# Patient Record
Sex: Female | Born: 1955 | Race: White | Hispanic: No | Smoking: Never smoker
Health system: Southern US, Community
[De-identification: ages and names within clinical notes are randomized; demographics above are authoritative.]

## PROBLEM LIST (undated history)

## (undated) DIAGNOSIS — K219 Gastro-esophageal reflux disease without esophagitis: Secondary | ICD-10-CM

## (undated) DIAGNOSIS — K8050 Calculus of bile duct without cholangitis or cholecystitis without obstruction: Secondary | ICD-10-CM

## (undated) DIAGNOSIS — Z8709 Personal history of other diseases of the respiratory system: Secondary | ICD-10-CM

## (undated) DIAGNOSIS — M549 Dorsalgia, unspecified: Secondary | ICD-10-CM

## (undated) DIAGNOSIS — R1032 Left lower quadrant pain: Secondary | ICD-10-CM

## (undated) DIAGNOSIS — K589 Irritable bowel syndrome without diarrhea: Secondary | ICD-10-CM

## (undated) DIAGNOSIS — K8020 Calculus of gallbladder without cholecystitis without obstruction: Secondary | ICD-10-CM

## (undated) DIAGNOSIS — R0602 Shortness of breath: Secondary | ICD-10-CM

## (undated) DIAGNOSIS — K5900 Constipation, unspecified: Secondary | ICD-10-CM

## (undated) DIAGNOSIS — M2550 Pain in unspecified joint: Secondary | ICD-10-CM

## (undated) DIAGNOSIS — M722 Plantar fascial fibromatosis: Secondary | ICD-10-CM

## (undated) DIAGNOSIS — I10 Essential (primary) hypertension: Secondary | ICD-10-CM

## (undated) DIAGNOSIS — F419 Anxiety disorder, unspecified: Secondary | ICD-10-CM

## (undated) DIAGNOSIS — E781 Pure hyperglyceridemia: Secondary | ICD-10-CM

## (undated) DIAGNOSIS — E7800 Pure hypercholesterolemia, unspecified: Secondary | ICD-10-CM

## (undated) HISTORY — DX: Shortness of breath: R06.02

## (undated) HISTORY — DX: Constipation, unspecified: K59.00

## (undated) HISTORY — DX: Personal history of other diseases of the respiratory system: Z87.09

## (undated) HISTORY — DX: Plantar fascial fibromatosis: M72.2

## (undated) HISTORY — DX: Gastro-esophageal reflux disease without esophagitis: K21.9

## (undated) HISTORY — DX: Pure hypercholesterolemia, unspecified: E78.00

## (undated) HISTORY — DX: Dorsalgia, unspecified: M54.9

## (undated) HISTORY — DX: Calculus of bile duct without cholangitis or cholecystitis without obstruction: K80.50

## (undated) HISTORY — DX: Pure hyperglyceridemia: E78.1

## (undated) HISTORY — DX: Calculus of gallbladder without cholecystitis without obstruction: K80.20

## (undated) HISTORY — PX: FLEXIBLE SIGMOIDOSCOPY: SHX1649

## (undated) HISTORY — DX: Irritable bowel syndrome, unspecified: K58.9

## (undated) HISTORY — DX: Pain in unspecified joint: M25.50

## (undated) HISTORY — DX: Left lower quadrant pain: R10.32

## (undated) HISTORY — DX: Anxiety disorder, unspecified: F41.9

---

## 1999-11-29 ENCOUNTER — Other Ambulatory Visit: Admission: RE | Admit: 1999-11-29 | Discharge: 1999-11-29 | Payer: Self-pay | Admitting: Obstetrics and Gynecology

## 1999-11-30 ENCOUNTER — Encounter: Payer: Self-pay | Admitting: Obstetrics and Gynecology

## 1999-11-30 ENCOUNTER — Encounter: Admission: RE | Admit: 1999-11-30 | Discharge: 1999-11-30 | Payer: Self-pay | Admitting: Obstetrics and Gynecology

## 2000-02-27 ENCOUNTER — Other Ambulatory Visit: Admission: RE | Admit: 2000-02-27 | Discharge: 2000-02-27 | Payer: Self-pay | Admitting: Gastroenterology

## 2004-06-08 ENCOUNTER — Ambulatory Visit (HOSPITAL_COMMUNITY): Admission: RE | Admit: 2004-06-08 | Discharge: 2004-06-08 | Payer: Self-pay | Admitting: Internal Medicine

## 2004-08-16 ENCOUNTER — Other Ambulatory Visit: Admission: RE | Admit: 2004-08-16 | Discharge: 2004-08-16 | Payer: Self-pay | Admitting: Obstetrics and Gynecology

## 2005-10-08 ENCOUNTER — Emergency Department (HOSPITAL_COMMUNITY): Admission: EM | Admit: 2005-10-08 | Discharge: 2005-10-08 | Payer: Self-pay | Admitting: Emergency Medicine

## 2006-04-03 ENCOUNTER — Other Ambulatory Visit: Admission: RE | Admit: 2006-04-03 | Discharge: 2006-04-03 | Payer: Self-pay | Admitting: Obstetrics and Gynecology

## 2007-02-02 ENCOUNTER — Emergency Department (HOSPITAL_COMMUNITY): Admission: EM | Admit: 2007-02-02 | Discharge: 2007-02-02 | Payer: Self-pay | Admitting: Emergency Medicine

## 2007-02-06 ENCOUNTER — Emergency Department (HOSPITAL_COMMUNITY): Admission: EM | Admit: 2007-02-06 | Discharge: 2007-02-06 | Payer: Self-pay | Admitting: Emergency Medicine

## 2007-02-18 ENCOUNTER — Ambulatory Visit: Payer: Self-pay | Admitting: Orthopedic Surgery

## 2007-03-05 DIAGNOSIS — Z8679 Personal history of other diseases of the circulatory system: Secondary | ICD-10-CM | POA: Insufficient documentation

## 2007-03-11 ENCOUNTER — Ambulatory Visit: Payer: Self-pay | Admitting: Orthopedic Surgery

## 2007-03-15 ENCOUNTER — Ambulatory Visit (HOSPITAL_COMMUNITY): Admission: RE | Admit: 2007-03-15 | Discharge: 2007-03-15 | Payer: Self-pay | Admitting: Orthopedic Surgery

## 2007-03-20 ENCOUNTER — Ambulatory Visit: Payer: Self-pay | Admitting: Orthopedic Surgery

## 2007-05-02 ENCOUNTER — Ambulatory Visit: Payer: Self-pay | Admitting: Orthopedic Surgery

## 2007-05-08 ENCOUNTER — Ambulatory Visit (HOSPITAL_COMMUNITY): Admission: RE | Admit: 2007-05-08 | Discharge: 2007-05-08 | Payer: Self-pay | Admitting: Orthopedic Surgery

## 2007-05-14 ENCOUNTER — Ambulatory Visit: Payer: Self-pay | Admitting: Orthopedic Surgery

## 2007-05-14 DIAGNOSIS — M25519 Pain in unspecified shoulder: Secondary | ICD-10-CM | POA: Insufficient documentation

## 2007-05-14 DIAGNOSIS — M542 Cervicalgia: Secondary | ICD-10-CM | POA: Insufficient documentation

## 2007-06-11 ENCOUNTER — Ambulatory Visit: Payer: Self-pay | Admitting: Orthopedic Surgery

## 2007-06-12 ENCOUNTER — Encounter: Payer: Self-pay | Admitting: Orthopedic Surgery

## 2007-06-12 DIAGNOSIS — M5412 Radiculopathy, cervical region: Secondary | ICD-10-CM | POA: Insufficient documentation

## 2007-07-02 ENCOUNTER — Ambulatory Visit (HOSPITAL_COMMUNITY): Admission: RE | Admit: 2007-07-02 | Discharge: 2007-07-02 | Payer: Self-pay | Admitting: Obstetrics and Gynecology

## 2007-08-14 ENCOUNTER — Ambulatory Visit: Payer: Self-pay | Admitting: Orthopedic Surgery

## 2008-05-20 ENCOUNTER — Telehealth: Payer: Self-pay | Admitting: Orthopedic Surgery

## 2009-02-02 ENCOUNTER — Ambulatory Visit (HOSPITAL_COMMUNITY): Admission: RE | Admit: 2009-02-02 | Discharge: 2009-02-02 | Payer: Self-pay | Admitting: Internal Medicine

## 2009-02-18 ENCOUNTER — Ambulatory Visit (HOSPITAL_COMMUNITY): Admission: RE | Admit: 2009-02-18 | Discharge: 2009-02-18 | Payer: Self-pay | Admitting: Internal Medicine

## 2009-07-03 ENCOUNTER — Emergency Department (HOSPITAL_COMMUNITY): Admission: EM | Admit: 2009-07-03 | Discharge: 2009-07-03 | Payer: Self-pay | Admitting: Emergency Medicine

## 2009-07-04 ENCOUNTER — Emergency Department (HOSPITAL_COMMUNITY): Admission: EM | Admit: 2009-07-04 | Discharge: 2009-07-04 | Payer: Self-pay | Admitting: Emergency Medicine

## 2010-05-23 ENCOUNTER — Ambulatory Visit (HOSPITAL_COMMUNITY): Admission: RE | Admit: 2010-05-23 | Discharge: 2010-05-23 | Payer: Self-pay | Admitting: Family Medicine

## 2010-12-08 LAB — DIFFERENTIAL
Eosinophils Relative: 2 % (ref 0–5)
Lymphocytes Relative: 25 % (ref 12–46)
Neutro Abs: 5.9 10*3/uL (ref 1.7–7.7)

## 2010-12-08 LAB — BASIC METABOLIC PANEL
CO2: 33 mEq/L — ABNORMAL HIGH (ref 19–32)
Chloride: 99 mEq/L (ref 96–112)
Creatinine, Ser: 0.68 mg/dL (ref 0.4–1.2)
GFR calc non Af Amer: >60 mL/min (ref >60)
Sodium: 138 mEq/L (ref 135–145)

## 2010-12-08 LAB — CBC
HCT: 46.2 % — ABNORMAL HIGH (ref 36.0–46.0)
Hemoglobin: 15.6 g/dL — ABNORMAL HIGH (ref 12.0–15.0)
MCV: 91.6 fL (ref 78.0–100.0)
Platelets: 202 10*3/uL (ref 150–400)
RBC: 5.04 MIL/uL (ref 3.87–5.11)
RDW: 12.9 % (ref 11.5–15.5)

## 2011-07-25 ENCOUNTER — Other Ambulatory Visit (HOSPITAL_COMMUNITY): Payer: Self-pay | Admitting: Family Medicine

## 2011-07-25 DIAGNOSIS — Z139 Encounter for screening, unspecified: Secondary | ICD-10-CM

## 2011-07-31 ENCOUNTER — Ambulatory Visit (HOSPITAL_COMMUNITY)
Admission: RE | Admit: 2011-07-31 | Discharge: 2011-07-31 | Disposition: A | Discharge status: Home / Self Care | Payer: PRIVATE HEALTH INSURANCE | Source: Ambulatory Visit | Attending: Family Medicine | Admitting: Family Medicine

## 2011-07-31 DIAGNOSIS — Z1231 Encounter for screening mammogram for malignant neoplasm of breast: Secondary | ICD-10-CM | POA: Insufficient documentation

## 2011-07-31 DIAGNOSIS — Z139 Encounter for screening, unspecified: Secondary | ICD-10-CM

## 2011-11-27 ENCOUNTER — Telehealth: Payer: Self-pay

## 2011-12-04 NOTE — Telephone Encounter (Signed)
Pt was [facility] the [facility]. Said she had a colonoscopy many years ago. Does not have money to do one, but I gave the number to [facility] so she can check it out. She will call back.

## 2011-12-06 NOTE — Telephone Encounter (Signed)
Called pt. She has not talked to [facility] yet. She will give her a call and let me know how it comes out.

## 2011-12-19 NOTE — Telephone Encounter (Signed)
Pt said she got the papers from [facility]. She has not completed. She will be doing that soon and when she knows something she will give me a call to schedule.

## 2012-01-22 ENCOUNTER — Other Ambulatory Visit (HOSPITAL_COMMUNITY): Payer: Self-pay | Admitting: Nurse Practitioner

## 2012-01-22 ENCOUNTER — Other Ambulatory Visit (HOSPITAL_COMMUNITY): Payer: Self-pay | Admitting: *Deleted

## 2012-01-22 DIAGNOSIS — R1012 Left upper quadrant pain: Secondary | ICD-10-CM

## 2012-01-24 ENCOUNTER — Ambulatory Visit (HOSPITAL_COMMUNITY)
Admission: RE | Admit: 2012-01-24 | Discharge: 2012-01-24 | Disposition: A | Discharge status: Home / Self Care | Payer: Self-pay | Source: Ambulatory Visit | Attending: Family Medicine | Admitting: Family Medicine

## 2012-01-24 DIAGNOSIS — K8020 Calculus of gallbladder without cholecystitis without obstruction: Secondary | ICD-10-CM | POA: Insufficient documentation

## 2012-01-24 DIAGNOSIS — R1012 Left upper quadrant pain: Secondary | ICD-10-CM | POA: Insufficient documentation

## 2013-08-01 ENCOUNTER — Emergency Department (HOSPITAL_COMMUNITY)
Admission: EM | Admit: 2013-08-01 | Discharge: 2013-08-01 | Disposition: A | Discharge status: Home / Self Care | Payer: BC Managed Care – PPO | Source: Home / Self Care | Attending: Emergency Medicine | Admitting: Emergency Medicine

## 2013-08-01 ENCOUNTER — Encounter (HOSPITAL_COMMUNITY): Payer: Self-pay | Admitting: Emergency Medicine

## 2013-08-01 DIAGNOSIS — K219 Gastro-esophageal reflux disease without esophagitis: Secondary | ICD-10-CM

## 2013-08-01 DIAGNOSIS — M542 Cervicalgia: Secondary | ICD-10-CM

## 2013-08-01 HISTORY — DX: Essential (primary) hypertension: I10

## 2013-08-01 MED ORDER — NAPROXEN 500 MG PO TABS: 500.0000 mg | ORAL_TABLET | Freq: Two times a day (BID) | ORAL | Status: DC

## 2013-08-01 MED ORDER — CYCLOBENZAPRINE HCL 10 MG PO TABS: 10.0000 mg | ORAL_TABLET | Freq: Two times a day (BID) | ORAL | Status: DC | PRN

## 2013-08-01 MED ORDER — OMEPRAZOLE 40 MG PO CPDR: 40.0000 mg | DELAYED_RELEASE_CAPSULE | Freq: Every day | ORAL | Status: DC

## 2013-08-01 NOTE — ED Notes (Signed)
Call from pharmacy. Pt was of the understanding that all 3 of her Rx were for a 90 day supply. Per Victoria Page, she has Rx w refills, this may last for 90 days w refills, and the flexaril is a as needed medication, and may last for 90 days, but did not intend to provide sufficient Rx for BID x 90 days supply . This information discussed w pharmacist

## 2013-08-01 NOTE — ED Provider Notes (Signed)
CSN: [id number]     Arrival date & time 08/01/13  1153 History   First MD Initiated Contact with Patient 08/01/13 1300     Chief Complaint  Patient presents with  . Neck Pain   (Consider location/radiation/quality/duration/timing/severity/associated sxs/prior Treatment) HPI Comments: 57 year old female presents complaining of neck pain for one year and acid reflux for many months. She said she finally got tired of these so she decided to come in to be seen. She has been seen for these problems before by the health department. No recent change in her pain. The pain on the bilateral neck in the muscles. She was previously prescribed naproxen which did help, she was also told to get a massage which she did not do.  Patient is a 57 y.o. female presenting with neck pain.  Neck Pain Associated symptoms: no chest pain, no fever and no weakness     Past Medical History  Diagnosis Date  . Hypertension    History reviewed. No pertinent past surgical history. No family history on file. History  Substance Use Topics  . Smoking status: Never Smoker   . Smokeless tobacco: Not on file  . Alcohol Use: No   OB History   Grav Para Term Preterm Abortions TAB SAB Ect Mult Living                 Review of Systems  Constitutional: Negative for fever and chills.  HENT: Positive for sore throat.   Eyes: Negative for visual disturbance.  Respiratory: Negative for cough and shortness of breath.   Cardiovascular: Negative for chest pain, palpitations and leg swelling.  Gastrointestinal: Negative for nausea, vomiting and abdominal pain.       Heartburn  Endocrine: Negative for polydipsia and polyuria.  Genitourinary: Negative for dysuria, urgency and frequency.  Musculoskeletal: Positive for neck pain. Negative for arthralgias and myalgias.  Skin: Negative for rash.  Neurological: Negative for dizziness, weakness and light-headedness.    Allergies  Review of patient's allergies indicates no  known allergies.  Home Medications   Current Outpatient Rx  Name  Route  Sig  Dispense  Refill  . NAPROXEN PO   Oral   Take by mouth.         . cyclobenzaprine (FLEXERIL) 10 MG tablet   Oral   Take 1 tablet (10 mg total) by mouth 2 (two) times daily as needed for muscle spasms.   30 tablet   0   . naproxen (NAPROSYN) 500 MG tablet   Oral   Take 1 tablet (500 mg total) by mouth 2 (two) times daily.   60 tablet   2   . omeprazole (PRILOSEC) 40 MG capsule   Oral   Take 1 capsule (40 mg total) by mouth daily.   30 capsule   2    BP 121/92  Pulse 72  Temp(Src) 98.5 F (36.9 C) (Oral)  Resp 18  SpO2 100% Physical Exam  Nursing note and vitals reviewed. Constitutional: She is oriented to person, place, and time. Vital signs are normal. She appears well-developed and well-nourished. No distress.  HENT:  Head: Normocephalic and atraumatic.  Cardiovascular: Normal rate, regular rhythm and normal heart sounds.   Pulmonary/Chest: Effort normal and breath sounds normal. No respiratory distress. She has no wheezes. She has no rales.  Abdominal: Soft. There is no tenderness.  Musculoskeletal:       Cervical back: She exhibits tenderness (superior traps), pain and spasm. She exhibits normal range of motion, no bony  tenderness, no swelling, no edema and no deformity.  Neurological: She is alert and oriented to person, place, and time. She has normal strength. Coordination normal.  Skin: Skin is warm and dry. No rash noted. She is not diaphoretic.  Psychiatric: She has a normal mood and affect. Judgment normal.    ED Course  Procedures (including critical care time) Labs Review Labs Reviewed - No data to display Imaging Review No results found.    MDM   1. Neck pain, bilateral   2. GERD (gastroesophageal reflux disease)    This patient has a primary care appointment set up for February. I told her that these are chronic medical problems and she needs to see her regular  physician. We'll try to help her symptoms for now. Discussed return precautions and red flags.   Meds ordered this encounter  Medications  . NAPROXEN PO    Sig: Take by mouth.  . naproxen (NAPROSYN) 500 MG tablet    Sig: Take 1 tablet (500 mg total) by mouth 2 (two) times daily.    Dispense:  60 tablet    Refill:  2    Order Specific Question:  Supervising Provider    Answer:  Lorenz Coaster, DAVID C [id number]  . cyclobenzaprine (FLEXERIL) 10 MG tablet    Sig: Take 1 tablet (10 mg total) by mouth 2 (two) times daily as needed for muscle spasms.    Dispense:  30 tablet    Refill:  0    Order Specific Question:  Supervising Provider    Answer:  Lorenz Coaster, DAVID C [id number]  . omeprazole (PRILOSEC) 40 MG capsule    Sig: Take 1 capsule (40 mg total) by mouth daily.    Dispense:  30 capsule    Refill:  2    Order Specific Question:  Supervising Provider    Answer:  Lorenz Coaster, DAVID C [[id number]]       Graylon Good, PA-C 08/01/13 1352

## 2013-08-01 NOTE — ED Notes (Signed)
Patient reports neck pain , intermittent "for approx a year".  Patient reports acid reflux "for months"

## 2013-08-01 NOTE — Discharge Instructions (Signed)
Diet for Gastroesophageal Reflux Disease, Adult Reflux (acid reflux) is when acid from your stomach flows up into the esophagus. When acid comes in contact with the esophagus, the acid causes irritation and soreness (inflammation) in the esophagus. When reflux happens often or so severely that it causes damage to the esophagus, it is called gastroesophageal reflux disease (GERD). Nutrition therapy can help ease the discomfort of GERD. FOODS OR DRINKS TO AVOID OR LIMIT  Smoking or chewing tobacco. Nicotine is one of the most potent stimulants to acid production in the gastrointestinal tract.  Caffeinated and decaffeinated coffee and black tea.  Regular or low-calorie carbonated beverages or energy drinks (caffeine-free carbonated beverages are allowed).   Strong spices, such as black pepper, white pepper, red pepper, cayenne, curry powder, and chili powder.  Peppermint or spearmint.  Chocolate.  High-fat foods, including meats and fried foods. Extra added fats including oils, butter, salad dressings, and nuts. Limit these to less than 8 tsp per day.  Fruits and vegetables if they are not tolerated, such as citrus fruits or tomatoes.  Alcohol.  Any food that seems to aggravate your condition. If you have questions regarding your diet, call your caregiver or a registered dietitian. OTHER THINGS THAT MAY HELP GERD INCLUDE:   Eating your meals slowly, in a relaxed setting.  Eating 5 to 6 small meals per day instead of 3 large meals.  Eliminating food for a period of time if it causes distress.  Not lying down until 3 hours after eating a meal.  Keeping the head of your bed raised 6 to 9 inches (15 to 23 cm) by using a foam wedge or blocks under the legs of the bed. Lying flat may make symptoms worse.  Being physically active. Weight loss may be helpful in reducing reflux in overweight or obese adults.  Wear loose fitting clothing EXAMPLE MEAL PLAN This meal plan is approximately  2,000 calories based on https://www.bernard.org/ meal planning guidelines. Breakfast   cup cooked oatmeal.  1 cup strawberries.  1 cup low-fat milk.  1 oz almonds. Snack  1 cup cucumber slices.  6 oz yogurt (made from low-fat or fat-free milk). Lunch  2 slice whole-wheat bread.  2 oz sliced [place].  2 tsp mayonnaise.  1 cup blueberries.  1 cup snap peas. Snack  6 whole-wheat crackers.  1 oz string cheese. Dinner   cup brown rice.  1 cup mixed veggies.  1 tsp olive oil.  3 oz grilled fish. Document Released: 08/21/2005 Document Revised: 11/13/2011 Document Reviewed: 07/07/2011 [facility] Patient Information 2014 [place], [place].  Gastroesophageal Reflux Disease, Adult Gastroesophageal reflux disease (GERD) happens when acid from your stomach flows up into the esophagus. When acid comes in contact with the esophagus, the acid causes soreness (inflammation) in the esophagus. Over time, GERD may create small holes (ulcers) in the lining of the esophagus. CAUSES   Increased body weight. This puts pressure on the stomach, making acid rise from the stomach into the esophagus.  Smoking. This increases acid production in the stomach.  Drinking alcohol. This causes decreased pressure in the lower esophageal sphincter (valve or ring of muscle between the esophagus and stomach), allowing acid from the stomach into the esophagus.  Late evening meals and a full stomach. This increases pressure and acid production in the stomach.  A malformed lower esophageal sphincter. Sometimes, no cause is found. SYMPTOMS   Burning pain in the lower part of the mid-chest behind the breastbone and in the mid-stomach area. This  may occur twice a week or more often.  Trouble swallowing.  Sore throat.  Dry cough.  Asthma-like symptoms including chest tightness, shortness of breath, or wheezing. DIAGNOSIS  Your caregiver may be able to diagnose GERD based on your symptoms. In some cases,  X-rays and other tests may be done to check for complications or to check the condition of your stomach and esophagus. TREATMENT  Your caregiver may recommend over-the-counter or prescription medicines to help decrease acid production. Ask your caregiver before starting or adding any new medicines.  HOME CARE INSTRUCTIONS   Change the factors that you can control. Ask your caregiver for guidance concerning weight loss, quitting smoking, and alcohol consumption.  Avoid foods and drinks that make your symptoms worse, such as:  Caffeine or alcoholic drinks.  Chocolate.  Peppermint or mint flavorings.  Garlic and onions.  Spicy foods.  Citrus fruits, such as oranges, lemons, or limes.  Tomato-based foods such as sauce, chili, salsa, and pizza.  Fried and fatty foods.  Avoid lying down for the 3 hours prior to your bedtime or prior to taking a nap.  Eat small, frequent meals instead of large meals.  Wear loose-fitting clothing. Do not wear anything tight around your waist that causes pressure on your stomach.  Raise the head of your bed 6 to 8 inches with wood blocks to help you sleep. Extra pillows will not help.  Only take over-the-counter or prescription medicines for pain, discomfort, or fever as directed by your caregiver.  Do not take aspirin, ibuprofen, or other nonsteroidal anti-inflammatory drugs (NSAIDs). SEEK IMMEDIATE MEDICAL CARE IF:   You have pain in your arms, neck, jaw, teeth, or back.  Your pain increases or changes in intensity or duration.  You develop nausea, vomiting, or sweating (diaphoresis).  You develop shortness of breath, or you faint.  Your vomit is green, yellow, black, or looks like coffee grounds or blood.  Your stool is red, bloody, or black. These symptoms could be signs of other problems, such as heart disease, gastric bleeding, or esophageal bleeding. MAKE SURE YOU:   Understand these instructions.  Will watch your  condition.  Will get help right away if you are not doing well or get worse. Document Released: 05/31/2005 Document Revised: 11/13/2011 Document Reviewed: 03/10/2011 [facility] Patient Information 2014 [place], [place].  Musculoskeletal Pain Musculoskeletal pain is muscle and boney aches and pains. These pains can occur in any part of the body. Your caregiver may treat you without knowing the cause of the pain. They may treat you if blood or urine tests, X-rays, and other tests were normal.  CAUSES There is often not a definite cause or reason for these pains. These pains may be caused by a type of germ (virus). The discomfort may also come from overuse. Overuse includes working out too hard when your body is not fit. Boney aches also come from weather changes. Bone is sensitive to atmospheric pressure changes. HOME CARE INSTRUCTIONS   Ask when your test results will be ready. Make sure you get your test results.  Only take over-the-counter or prescription medicines for pain, discomfort, or fever as directed by your caregiver. If you were given medications for your condition, do not drive, operate machinery or power tools, or sign legal documents for 24 hours. Do not drink alcohol. Do not take sleeping pills or other medications that may interfere with treatment.  Continue all activities unless the activities cause more pain. When the pain lessens, slowly resume normal activities.  Gradually increase the intensity and duration of the activities or exercise.  During periods of severe pain, bed rest may be helpful. Lay or sit in any position that is comfortable.  Putting ice on the injured area.  Put ice in a bag.  Place a towel between your skin and the bag.  Leave the ice on for 15 to 20 minutes, 3 to 4 times a day.  Follow up with your caregiver for continued problems and no reason can be found for the pain. If the pain becomes worse or does not go away, it may be necessary to repeat tests  or do additional testing. Your caregiver may need to look further for a possible cause. SEEK IMMEDIATE MEDICAL CARE IF:  You have pain that is getting worse and is not relieved by medications.  You develop chest pain that is associated with shortness or breath, sweating, feeling sick to your stomach (nauseous), or throw up (vomit).  Your pain becomes localized to the abdomen.  You develop any new symptoms that seem different or that concern you. MAKE SURE YOU:   Understand these instructions.  Will watch your condition.  Will get help right away if you are not doing well or get worse. Document Released: 08/21/2005 Document Revised: 11/13/2011 Document Reviewed: 04/10/2008 [facility] Patient Information 2014 [place], [place].

## 2013-08-01 NOTE — ED Provider Notes (Signed)
Medical screening examination/treatment/procedure(s) were performed by non-physician practitioner and as supervising physician I was immediately available for consultation/collaboration.  Leslee Home, M.D.  Reuben Likes, MD 08/01/13 2045

## 2013-11-05 ENCOUNTER — Encounter: Payer: Self-pay | Admitting: *Deleted

## 2013-11-07 ENCOUNTER — Ambulatory Visit (INDEPENDENT_AMBULATORY_CARE_PROVIDER_SITE_OTHER): Payer: BC Managed Care – PPO | Admitting: Gastroenterology

## 2013-11-07 ENCOUNTER — Encounter: Payer: Self-pay | Admitting: Gastroenterology

## 2013-11-07 VITALS — Ht 65.0 in | Wt 176.0 lb

## 2013-11-07 DIAGNOSIS — R143 Flatulence: Secondary | ICD-10-CM

## 2013-11-07 DIAGNOSIS — R1012 Left upper quadrant pain: Secondary | ICD-10-CM

## 2013-11-07 DIAGNOSIS — R6881 Early satiety: Secondary | ICD-10-CM

## 2013-11-07 DIAGNOSIS — R141 Gas pain: Secondary | ICD-10-CM

## 2013-11-07 DIAGNOSIS — Z1211 Encounter for screening for malignant neoplasm of colon: Secondary | ICD-10-CM

## 2013-11-07 DIAGNOSIS — R142 Eructation: Secondary | ICD-10-CM

## 2013-11-07 MED ORDER — DICYCLOMINE HCL 10 MG PO CAPS: 10.0000 mg | ORAL_CAPSULE | Freq: Two times a day (BID) | ORAL | Status: DC

## 2013-11-07 MED ORDER — MOVIPREP 100 G PO SOLR: 1.0000 | Freq: Once | ORAL | Status: DC

## 2013-11-07 NOTE — Patient Instructions (Signed)
You have been scheduled for an endoscopy and colonoscopy with propofol. Please follow the written instructions given to you at your visit today. Please pick up your prep at the pharmacy within the next 1-3 days. If you use inhalers (even only as needed), please bring them with you on the day of your procedure. Your physician has requested that you go to www.startemmi.com and enter the access code given to you at your visit today. This web site gives a general overview about your procedure. However, you should still follow specific instructions given to you by our office regarding your preparation for the procedure.  We have given you samples of the following medication to take: Dexilant 60 mg, please take one capsule by mouth thirty minutes before breakfast   We have sent the following medications to your pharmacy for you to pick up at your convenience: Bentyl 10 mg

## 2013-11-11 ENCOUNTER — Encounter: Payer: Self-pay | Admitting: Internal Medicine

## 2013-11-21 ENCOUNTER — Encounter: Payer: Self-pay | Admitting: Gastroenterology

## 2013-11-21 ENCOUNTER — Telehealth: Payer: Self-pay | Admitting: *Deleted

## 2013-11-21 NOTE — Telephone Encounter (Signed)
I called Zack Hall's office at [phone] They are faxing records over

## 2013-11-21 NOTE — Telephone Encounter (Signed)
I called [facility], Selinda FlavinKevin Howard, [place] office. I spoke with Baxter HireKristen they do not have lab work on patient. Would you like me to have patient come back to office to get labs done?

## 2013-11-21 NOTE — Progress Notes (Signed)
Reviewed and agree.

## 2013-11-21 NOTE — Telephone Encounter (Signed)
Tresa EndoKelly,  It looks like these labs were actually performed by Dr. Dwana MelenaZack Hall.  I do not know where he is located (maybe an urgent care).  These were the labs that we received with the black ink lines through them that I could not read.  Thank you,  Jess

## 2013-11-21 NOTE — Progress Notes (Signed)
11/21/2013 Victoria Page [id number] 1956/08/29   HISTORY OF PRESENT ILLNESS:  This is a 58 year old female who is known previously to Dr. Sharlett Iles for colonoscopy several years ago, but we cannot find those records at this time.  She is being referred here today by Dr. Wende Neighbors.  She presents to our office today with a few different complaints.  First, she reports LLQ abdominal pain, but says that this same pain has been present for several years.  She has history of IBS diagnosis in the past.  She also complains of LUQ abdominal pain that radiates to her back and has only been present for the past 2 months or so.  Complains of feeling full and bloating/belching.  She was previously taking Naprosyn.  Tried omeprazole for a few weeks, but did not notice any difference with that medication.  She did have an ultrasound in 01/2012 for complaints of LUQ abdominal pain, however, which revealed a 1.9 cm gallstone in the gallbladder but was otherwise unremarkable.  She had labs performed by her PCP recently and we received a copy of those, however, they are not legible due to black ink lines running through the results.  She denies any nausea, vomiting, fever, chills, or weight loss (says that she has actually been gaining weight).   Past Medical History  Diagnosis Date  . Hypertension   . GERD (gastroesophageal reflux disease)    History reviewed. No pertinent past surgical history.  reports that she has never smoked. She does not have any smokeless tobacco history on file. She reports that she does not drink alcohol or use illicit drugs. family history includes Diverticulitis in her paternal grandmother; Heart attack in her father; Lung cancer in her mother. No Known Allergies    Outpatient Encounter Prescriptions as of 11/07/2013  Medication Sig  . hydrochlorothiazide (HYDRODIURIL) 25 MG tablet Take 25 mg by mouth daily.  . naproxen (NAPROSYN) 500 MG tablet Take 1 tablet (500 mg total) by mouth 2  (two) times daily.  . [DISCONTINUED] cyclobenzaprine (FLEXERIL) 10 MG tablet Take 1 tablet (10 mg total) by mouth 2 (two) times daily as needed for muscle spasms.  . [DISCONTINUED] NAPROXEN PO Take by mouth.  . [DISCONTINUED] omeprazole (PRILOSEC) 40 MG capsule Take 1 capsule (40 mg total) by mouth daily.  Marland Kitchen dicyclomine (BENTYL) 10 MG capsule Take 1 capsule (10 mg total) by mouth 2 (two) times daily.  Marland Kitchen MOVIPREP 100 G SOLR Take 1 kit (200 g total) by mouth once.     REVIEW OF SYSTEMS  : All other systems reviewed and negative except where noted in the History of Present Illness.   PHYSICAL EXAM: Ht 5' 5" (1.651 m)  Wt 176 lb (79.833 kg)  BMI 29.29 kg/m2 General: Well developed white female in no acute distress Head: Normocephalic and atraumatic Eyes:  Sclerae anicteric, conjunctiva pink. Ears: Normal auditory acuity  Lungs: Clear throughout to auscultation Heart: Regular rate and rhythm Abdomen: Soft, non-distended.  Normal bowel sounds.  Minimal LUQ and LLQ TTP without R/R/G. Rectal:  Deferred.  Will be done at the time of colonoscopy. Musculoskeletal: Symmetrical with no gross deformities  Skin: No lesions on visible extremities Extremities: No edema  Neurological: Alert oriented x 4, grossly non-focal Psychological:  Alert and cooperative. Normal mood and affect  ASSESSMENT AND PLAN: -Screening colonoscopy:  Will schedule.  The risks, benefits, and alternatives were discussed with the patient and she consents to proceed.  -Complaints of LUQ abdominal pain with  bloating and early satiety.  Rule out ulcer disease, etc.  Will start her on Dexilant 60 mg daily (samples given for now).  Schedule EGD as well.  If EGD normal and symptoms continue despite regular PPI therapy, then may want to consider GES vs HIDA scan vs CT scan. -LLQ abdominal pain:  Present for several years.  Has past diagnosis of IBS, which is the likely source of this pain.  Will try Bentyl 10 mg BID.  **She had  labs performed by her PCP and we received a copy of those, however, they were not legible.  We will request that those be sent to use again.

## 2013-12-24 ENCOUNTER — Other Ambulatory Visit: Payer: Self-pay | Admitting: *Deleted

## 2013-12-31 ENCOUNTER — Encounter: Payer: BC Managed Care – PPO | Admitting: Internal Medicine

## 2014-06-08 ENCOUNTER — Emergency Department (HOSPITAL_COMMUNITY)
Admission: EM | Admit: 2014-06-08 | Discharge: 2014-06-08 | Disposition: A | Discharge status: Home / Self Care | Payer: BC Managed Care – PPO | Attending: Emergency Medicine | Admitting: Emergency Medicine

## 2014-06-08 ENCOUNTER — Emergency Department (HOSPITAL_COMMUNITY): Payer: BC Managed Care – PPO

## 2014-06-08 ENCOUNTER — Encounter (HOSPITAL_COMMUNITY): Payer: Self-pay | Admitting: Emergency Medicine

## 2014-06-08 DIAGNOSIS — I10 Essential (primary) hypertension: Secondary | ICD-10-CM | POA: Insufficient documentation

## 2014-06-08 DIAGNOSIS — R110 Nausea: Secondary | ICD-10-CM | POA: Diagnosis not present

## 2014-06-08 DIAGNOSIS — Z791 Long term (current) use of non-steroidal anti-inflammatories (NSAID): Secondary | ICD-10-CM | POA: Insufficient documentation

## 2014-06-08 DIAGNOSIS — R079 Chest pain, unspecified: Secondary | ICD-10-CM | POA: Diagnosis present

## 2014-06-08 DIAGNOSIS — K219 Gastro-esophageal reflux disease without esophagitis: Secondary | ICD-10-CM | POA: Insufficient documentation

## 2014-06-08 DIAGNOSIS — R0789 Other chest pain: Secondary | ICD-10-CM | POA: Diagnosis not present

## 2014-06-08 DIAGNOSIS — R002 Palpitations: Secondary | ICD-10-CM | POA: Diagnosis not present

## 2014-06-08 DIAGNOSIS — Z79899 Other long term (current) drug therapy: Secondary | ICD-10-CM | POA: Insufficient documentation

## 2014-06-08 LAB — BASIC METABOLIC PANEL
Anion gap: 12 (ref 5–15)
BUN: 15 mg/dL (ref 6–23)
CO2: 28 meq/L (ref 19–32)
CREATININE: 0.66 mg/dL (ref 0.50–1.10)
Calcium: 9.5 mg/dL (ref 8.4–10.5)
Chloride: 97 mEq/L (ref 96–112)
GFR calc non Af Amer: >90 mL/min (ref >90)
GLUCOSE: 102 mg/dL — AB (ref 70–99)
Potassium: 3.5 mEq/L — ABNORMAL LOW (ref 3.7–5.3)
Sodium: 137 mEq/L (ref 137–147)

## 2014-06-08 LAB — CBC
HCT: 44.5 % (ref 36.0–46.0)
HEMOGLOBIN: 15.1 g/dL — AB (ref 12.0–15.0)
MCH: 30.3 pg (ref 26.0–34.0)
MCHC: 33.9 g/dL (ref 30.0–36.0)
MCV: 89.4 fL (ref 78.0–100.0)
PLATELETS: 222 10*3/uL (ref 150–400)
RBC: 4.98 MIL/uL (ref 3.87–5.11)
RDW: 12.8 % (ref 11.5–15.5)
WBC: 9 10*3/uL (ref 4.0–10.5)

## 2014-06-08 LAB — I-STAT TROPONIN, ED
TROPONIN I, POC: 0 ng/mL (ref 0.00–0.08)
Troponin i, poc: 0 ng/mL (ref 0.00–0.08)

## 2014-06-08 MED ORDER — GI COCKTAIL ~~LOC~~
30.0000 mL | Freq: Once | ORAL | Status: AC
  Administered 2014-06-08: 30 mL via ORAL
  Filled 2014-06-08: qty 30

## 2014-06-08 MED ORDER — IBUPROFEN 800 MG PO TABS
800.0000 mg | ORAL_TABLET | Freq: Once | ORAL | Status: AC
  Administered 2014-06-08: 800 mg via ORAL
  Filled 2014-06-08: qty 1

## 2014-06-08 MED ORDER — ONDANSETRON HCL 4 MG/2ML IJ SOLN
4.0000 mg | Freq: Once | INTRAMUSCULAR | Status: DC
  Filled 2014-06-08: qty 2

## 2014-06-08 NOTE — ED Notes (Signed)
Pt not requesting IV medication for nausea.  Stating "It's not that bad".  Will hold off on IV medication.  Informed pt if she continued to have nausea that there was PO nausea medication available.

## 2014-06-08 NOTE — ED Provider Notes (Signed)
CSN: [id number]     Arrival date & time 06/08/14  1930 History   First MD Initiated Contact with Patient 06/08/14 1951     Chief Complaint  Patient presents with  . Chest Pain  . Arm Pain     (Consider location/radiation/quality/duration/timing/severity/associated sxs/prior Treatment) HPI EMINE LOPATA is a 58 y.o. female with PMH of HTN, GERD presenting with sharp left shoulder pain onset 2 hours ago. Patient developed chest tightness in substernal region at rest around 1500. Pain does not radiate. Its constant and unchanged. Not worse with deep breathing, eating. Worse with movement. Associated with mild SOB, nausea, without diaphoresis or emesis. Patient reports belching often, more than normal. Patient took baby aspirin and zantac without relief. Patient has never been evaluated by cardiology. Family history of MI in father at age 64. Patient without history DVT, PE, unilateral leg swelling. No hemoptysis, recent surgeries, trauma, immobility. Patient reports drinking three cups of coffee today and feeling jittery.    Past Medical History  Diagnosis Date  . Hypertension   . GERD (gastroesophageal reflux disease)    History reviewed. No pertinent past surgical history. Family History  Problem Relation Age of Onset  . Lung cancer Mother   . Heart attack Father   . Diverticulitis Paternal Grandmother    History  Substance Use Topics  . Smoking status: Never Smoker   . Smokeless tobacco: Not on file  . Alcohol Use: No   OB History   Grav Para Term Preterm Abortions TAB SAB Ect Mult Living                 Review of Systems  Constitutional: Negative for fever and chills.  HENT: Negative for congestion and rhinorrhea.   Eyes: Negative for visual disturbance.  Respiratory: Negative for cough and shortness of breath.   Cardiovascular: Positive for chest pain and palpitations. Negative for leg swelling.  Gastrointestinal: Positive for nausea. Negative for vomiting and  diarrhea.  Genitourinary: Negative for dysuria and hematuria.  Musculoskeletal: Negative for back pain and gait problem.  Skin: Negative for rash.  Neurological: Negative for weakness and headaches.      Allergies  Review of patient's allergies indicates no known allergies.  Home Medications   Prior to Admission medications   Medication Sig Start Date End Date Taking? Authorizing Provider  aspirin 81 MG tablet Take 81 mg by mouth daily as needed for pain.   Yes Historical Provider, MD  dicyclomine (BENTYL) 10 MG capsule Take 1 capsule (10 mg total) by mouth 2 (two) times daily. 11/07/13  Yes Jessica D. Zehr, PA-C  hydrochlorothiazide (HYDRODIURIL) 25 MG tablet Take 25 mg by mouth daily.   Yes Historical Provider, MD  naproxen (NAPROSYN) 500 MG tablet Take 1 tablet (500 mg total) by mouth 2 (two) times daily. 08/01/13  Yes Adrian Blackwater Baker, PA-C  Ranitidine HCl (ZANTAC PO) Take 1 tablet by mouth daily as needed (for heartburn).   Yes Historical Provider, MD   BP 144/102  Pulse 64  Temp(Src) 97.8 F (36.6 C) (Oral)  Resp 16  Ht 5\' 4"  (1.626 m)  Wt 171 lb (77.565 kg)  BMI 29.34 kg/m2  SpO2 100% Physical Exam  Nursing note and vitals reviewed. Constitutional: She appears well-developed and well-nourished. No distress.  HENT:  Head: Normocephalic and atraumatic.  Eyes: Conjunctivae and EOM are normal. Right eye exhibits no discharge. Left eye exhibits no discharge.  Cardiovascular: Normal rate, regular rhythm and normal heart sounds.   Pulmonary/Chest: Effort  normal and breath sounds normal. No respiratory distress. She has no wheezes.  Abdominal: Soft. Bowel sounds are normal. She exhibits no distension. There is no tenderness.  Musculoskeletal:  Left shoulder FROM with crepitus. No deformity, warmth, erythema, edema.   Neurological: She is alert. She exhibits normal muscle tone. Coordination normal.  Skin: Skin is warm and dry. She is not diaphoretic.    ED Course  Procedures  (including critical care time) Labs Review Labs Reviewed  CBC - Abnormal; Notable for the following:    Hemoglobin 15.1 (*)    All other components within normal limits  BASIC METABOLIC PANEL - Abnormal; Notable for the following:    Potassium 3.5 (*)    Glucose, Bld 102 (*)    All other components within normal limits  I-STAT TROPOININ, ED  I-STAT TROPOININ, ED    Imaging Review Dg Chest 2 View  06/08/2014   CLINICAL DATA:  Generally feeling, left arm numbness, indigestion for few lower semi: Initial visit  EXAM: CHEST  2 VIEW  COMPARISON:  None.  FINDINGS: The lungs are adequately inflated. There is no focal infiltrate. The heart and pulmonary vascularity are normal. There is no pleural effusion or pneumothorax. There is mild tortuosity of the descending thoracic aorta. The bony thorax exhibits no acute abnormality.  IMPRESSION: There is no acute cardiopulmonary abnormality.   Electronically Signed   By: David  Swaziland[place]   On: 06/08/2014 21:09     EKG Interpretation   Date/Time:  Monday June 08 2014 19:52:18 EDT Ventricular Rate:  75 PR Interval:  160 QRS Duration: 82 QT Interval:  400 QTC Calculation: 446 R Axis:   6 Text Interpretation:  Normal sinus rhythm Normal ECG No previous ECGs  available Confirmed by YAO  MD, DAVID ([id number]) on 06/08/2014 8:11:05 PM     Meds given in ED:  Medications  ondansetron [employer](ZOFRAN) injection 4 mg (4 mg Intravenous Not Given 06/08/14 2227)  gi cocktail (Maalox,Lidocaine,Donnatal) (30 mLs Oral Given 06/08/14 2031)  ibuprofen (ADVIL,MOTRIN) tablet 800 mg (800 mg Oral Given 06/08/14 2225)    New Prescriptions   No medications on file      MDM   Final diagnoses:  Other chest pain   Chest pain is not likely of cardiac or pulmonary etiology d/t presentation, low risk for PE by Well's, low risk HEART score. VSS, no JVD or new murmur, RRR, breath sounds equal bilaterally, EKG without acute abnormalities, negative troponin and delta troponin, and  negative CXR. Pt has been advised to return to the ED if CP becomes exertional, associated with diaphoresis or nausea, radiates to left jaw/arm, worsens or becomes concerning in any way. Patient given strict instructions to follow up with PCP in regards to today. Pt appears reliable for follow up and is agreeable to discharge.   Discussed return precautions with patient. Discussed all results and patient verbalizes understanding and agrees with plan.  This is a shared patient. This patient was discussed with the physician who saw and evaluated the patient and agrees with the plan.       Louann SjogrenVictoria L Darrik Richman, PA-C 06/08/14 2230

## 2014-06-08 NOTE — Discharge Instructions (Signed)
Return to the emergency room with worsening of symptoms, new symptoms or with symptoms that are concerning , especially chest pain that feels like a pressure, spreads to left arm or jaw, worse with exertion, associated with nausea, vomiting, shortness of breath and/or sweating.  °Please call your doctor for a followup appointment within 24-48 hours. When you talk to your doctor please let them know that you were seen in the emergency department and have them acquire all of your records so that they can discuss the findings with you and formulate a treatment plan to fully care for your new and ongoing problems. ° ° °Chest Pain (Nonspecific) °It is often hard to give a specific diagnosis for the cause of chest pain. There is always a chance that your pain could be related to something serious, such as a heart attack or a blood clot in the lungs. You need to follow up with your health care provider for further evaluation. °CAUSES  °· Heartburn. °· Pneumonia or bronchitis. °· Anxiety or stress. °· Inflammation around your heart (pericarditis) or lung (pleuritis or pleurisy). °· A blood clot in the lung. °· A collapsed lung (pneumothorax). It can develop suddenly on its own (spontaneous pneumothorax) or from trauma to the chest. °· Shingles infection (herpes zoster virus). °The chest wall is composed of bones, muscles, and cartilage. Any of these can be the source of the pain. °· The bones can be bruised by injury. °· The muscles or cartilage can be strained by coughing or overwork. °· The cartilage can be affected by inflammation and become sore (costochondritis). °DIAGNOSIS  °Lab tests or other studies may be needed to find the cause of your pain. Your health care provider may have you take a test called an ambulatory electrocardiogram (ECG). An ECG records your heartbeat patterns over a 24-hour period. You may also have other tests, such as: °· Transthoracic echocardiogram (TTE). During echocardiography, sound waves  are used to evaluate how blood flows through your heart. °· Transesophageal echocardiogram (TEE). °· Cardiac monitoring. This allows your health care provider to monitor your heart rate and rhythm in real time. °· Holter monitor. This is a portable device that records your heartbeat and can help diagnose heart arrhythmias. It allows your health care provider to track your heart activity for several days, if needed. °· Stress tests by exercise or by giving medicine that makes the heart beat faster. °TREATMENT  °· Treatment depends on what may be causing your chest pain. Treatment may include: °¨ Acid blockers for heartburn. °¨ Anti-inflammatory medicine. °¨ Pain medicine for inflammatory conditions. °¨ Antibiotics if an infection is present. °· You may be advised to change lifestyle habits. This includes stopping smoking and avoiding alcohol, caffeine, and chocolate. °· You may be advised to keep your head raised (elevated) when sleeping. This reduces the chance of acid going backward from your stomach into your esophagus. °Most of the time, nonspecific chest pain will improve within 2-3 days with rest and mild pain medicine.  °HOME CARE INSTRUCTIONS  °· If antibiotics were prescribed, take them as directed. Finish them even if you start to feel better. °· For the next few days, avoid physical activities that bring on chest pain. Continue physical activities as directed. °· Do not use any tobacco products, including cigarettes, chewing tobacco, or electronic cigarettes. °· Avoid drinking alcohol. °· Only take medicine as directed by your health care provider. °· Follow your health care provider's suggestions for further testing if your chest pain does   not go away. °· Keep any follow-up appointments you made. If you do not go to an appointment, you could develop lasting (chronic) problems with pain. If there is any problem keeping an appointment, call to reschedule. °SEEK MEDICAL CARE IF:  °· Your chest pain does not  go away, even after treatment. °· You have a rash with blisters on your chest. °· You have a fever. °SEEK IMMEDIATE MEDICAL CARE IF:  °· You have increased chest pain or pain that spreads to your arm, neck, jaw, back, or abdomen. °· You have shortness of breath. °· You have an increasing cough, or you cough up blood. °· You have severe back or abdominal pain. °· You feel nauseous or vomit. °· You have severe weakness. °· You faint. °· You have chills. °This is an emergency. Do not wait to see if the pain will go away. Get medical help at once. Call your local emergency services (911 in [place] Do not drive yourself to the hospital. °MAKE SURE YOU:  °· Understand these instructions. °· Will watch your condition. °· Will get help right away if you are not doing well or get worse. °Document Released: 05/31/2005 Document Revised: 08/26/2013 Document Reviewed: 03/26/2008 °[employer]® Patient Information ©2015 [employer]. This information is not intended to replace advice given to you by your health care provider. Make sure you discuss any questions you have with your health care provider. ° °

## 2014-06-08 NOTE — ED Notes (Addendum)
Pt took one 81 mg asa and zantac around 1830

## 2014-06-08 NOTE — ED Notes (Signed)
Pt reports chest heaviness and left arm pain starting around 1500. Pt states she feels jittery, and "doesn't feet right". Pt states she has chronic indigestion. Pt reports drinking three cups of coffee today. Pt endorses mild shortness of breath and nausea. Pt denies diaphoresis.

## 2014-06-09 NOTE — ED Provider Notes (Signed)
Medical screening examination/treatment/procedure(s) were conducted as a shared visit with non-physician practitioner(s) and myself.  I personally evaluated the patient during the encounter.   EKG Interpretation   Date/Time:  Monday June 08 2014 19:52:18 EDT Ventricular Rate:  75 PR Interval:  160 QRS Duration: 82 QT Interval:  400 QTC Calculation: 446 R Axis:   6 Text Interpretation:  Normal sinus rhythm Normal ECG No previous ECGs  available Confirmed by Jozef Eisenbeis  MD, Lyniah Fujita ([id number]) on 06/08/2014 8:11:05 PM      Bosie HelperVictoria A Page is a 58 y.o. female hx of HTN, GERD here with L shoulder pain, chest pain. Pain worse with movement. Associated with chest tightness, denies shortness of breath to me. Father has MI age 57. Vitals stable. Reproducible tenderness. Trop neg x 2. CXR unremarkable. I doubt PE or ACS. Will have patient f/u with PMD.    Richardean Canalavid H Kerensa Nicklas, MD 06/09/14 2232

## 2014-06-10 ENCOUNTER — Other Ambulatory Visit (HOSPITAL_COMMUNITY): Payer: Self-pay | Admitting: Internal Medicine

## 2014-06-10 DIAGNOSIS — R109 Unspecified abdominal pain: Secondary | ICD-10-CM

## 2014-06-12 ENCOUNTER — Other Ambulatory Visit (HOSPITAL_COMMUNITY): Payer: Self-pay | Admitting: Internal Medicine

## 2014-06-12 ENCOUNTER — Ambulatory Visit (HOSPITAL_COMMUNITY)
Admission: RE | Admit: 2014-06-12 | Discharge: 2014-06-12 | Disposition: A | Discharge status: Home / Self Care | Payer: BC Managed Care – PPO | Source: Ambulatory Visit | Attending: Internal Medicine | Admitting: Internal Medicine

## 2014-06-12 DIAGNOSIS — M542 Cervicalgia: Secondary | ICD-10-CM

## 2014-06-12 DIAGNOSIS — M79671 Pain in right foot: Secondary | ICD-10-CM

## 2014-06-12 DIAGNOSIS — K8010 Calculus of gallbladder with chronic cholecystitis without obstruction: Secondary | ICD-10-CM | POA: Diagnosis not present

## 2014-06-12 DIAGNOSIS — R109 Unspecified abdominal pain: Secondary | ICD-10-CM

## 2014-06-12 DIAGNOSIS — R1084 Generalized abdominal pain: Secondary | ICD-10-CM | POA: Diagnosis present

## 2014-08-11 ENCOUNTER — Other Ambulatory Visit: Payer: Self-pay | Admitting: Adult Health

## 2014-08-11 DIAGNOSIS — Z1231 Encounter for screening mammogram for malignant neoplasm of breast: Secondary | ICD-10-CM

## 2014-08-14 ENCOUNTER — Ambulatory Visit (HOSPITAL_COMMUNITY): Payer: BC Managed Care – PPO

## 2014-08-19 ENCOUNTER — Encounter: Payer: Self-pay | Admitting: Adult Health

## 2014-08-19 ENCOUNTER — Other Ambulatory Visit (HOSPITAL_COMMUNITY)
Admission: RE | Admit: 2014-08-19 | Discharge: 2014-08-19 | Disposition: A | Discharge status: Home / Self Care | Payer: BC Managed Care – PPO | Source: Ambulatory Visit | Attending: Adult Health | Admitting: Adult Health

## 2014-08-19 ENCOUNTER — Ambulatory Visit (INDEPENDENT_AMBULATORY_CARE_PROVIDER_SITE_OTHER): Payer: BC Managed Care – PPO | Admitting: Adult Health

## 2014-08-19 VITALS — BP 132/90 | HR 78 | Ht 64.0 in | Wt 172.5 lb

## 2014-08-19 DIAGNOSIS — Z1151 Encounter for screening for human papillomavirus (HPV): Secondary | ICD-10-CM | POA: Diagnosis present

## 2014-08-19 DIAGNOSIS — Z01419 Encounter for gynecological examination (general) (routine) without abnormal findings: Secondary | ICD-10-CM | POA: Insufficient documentation

## 2014-08-19 DIAGNOSIS — R1032 Left lower quadrant pain: Secondary | ICD-10-CM

## 2014-08-19 DIAGNOSIS — Z1212 Encounter for screening for malignant neoplasm of rectum: Secondary | ICD-10-CM

## 2014-08-19 HISTORY — DX: Left lower quadrant pain: R10.32

## 2014-08-19 LAB — HEMOCCULT GUIAC POC 1CARD (OFFICE): Fecal Occult Blood, POC: NEGATIVE

## 2014-08-19 NOTE — Progress Notes (Signed)
Patient ID: Victoria Page, female   DOB: [birth year], 58 y.o.   MRN: [id number] History of Present Illness: Victoria SparVictoria is a 58 year old white female, married in for pap and physical.She has pelvic pain on and off for like 25 years, related to IBS she thinks.It has been about 5 years since last pap or mammogram, had colonoscopy about 12 years ago.   Current Medications, Allergies, Past Medical History, Past Surgical History, Family History and Social History were reviewed in [name] electronic medical record.     Review of Systems: Patient denies any headaches, blurred vision, shortness of breath, chest pain, abdominal pain, problems with bowel movements, urination, or intercourse. No joint pain or mood swings,see HPI.Has labs with Dr Margo AyeHall, declines flu shot.    Physical Exam:BP 132/90 mmHg  Pulse 78  Ht 5\' 4"  (1.626 m)  Wt 172 lb 8 oz (78.245 kg)  BMI 29.59 kg/m2 General:  Well developed, well nourished, no acute distress Skin:  Warm and dry Neck:  Midline trachea, normal thyroid Lungs; Clear to auscultation bilaterally Breast:  No dominant palpable mass, retraction, or nipple discharge Cardiovascular: Regular rate and rhythm Abdomen:  Soft, non tender, no hepatosplenomegaly Pelvic:  External genitalia is normal in appearance.  The vagina is pale with loss of rugae and moisture.    The cervix is smooth and stenotic at os.Pap with HPV performed.  Uterus is felt to be normal size, shape, and contour.  No    adnexal masses, LLQ tenderness noted. Rectal: Good sphincter tone, no polyps, or hemorrhoids felt.  Hemoccult negative. Extremities:  No swelling or varicosities noted Psych:  No mood changes,alert and cooperative,seems happy   Impression: Well woman gyn exam with pap LLQ pain    Plan: Return in 2 weeks for gyn US and see me Physical in 1 year Mammogram 12/21 and yearly

## 2014-08-19 NOTE — Patient Instructions (Signed)
Physical in 1 year Mammogram now and yearly Return in 2 week for GYn US and see me

## 2014-08-21 LAB — CYTOLOGY - PAP

## 2014-08-24 ENCOUNTER — Ambulatory Visit (HOSPITAL_COMMUNITY)
Admission: RE | Admit: 2014-08-24 | Discharge: 2014-08-24 | Disposition: A | Discharge status: Home / Self Care | Payer: BC Managed Care – PPO | Source: Ambulatory Visit | Attending: Adult Health | Admitting: Adult Health

## 2014-08-24 DIAGNOSIS — Z1231 Encounter for screening mammogram for malignant neoplasm of breast: Secondary | ICD-10-CM | POA: Diagnosis not present

## 2014-09-03 ENCOUNTER — Ambulatory Visit (INDEPENDENT_AMBULATORY_CARE_PROVIDER_SITE_OTHER): Payer: BC Managed Care – PPO

## 2014-09-03 ENCOUNTER — Encounter: Payer: Self-pay | Admitting: Adult Health

## 2014-09-03 ENCOUNTER — Ambulatory Visit (INDEPENDENT_AMBULATORY_CARE_PROVIDER_SITE_OTHER): Payer: BC Managed Care – PPO | Admitting: Adult Health

## 2014-09-03 VITALS — BP 128/82 | Ht 64.0 in | Wt 172.5 lb

## 2014-09-03 DIAGNOSIS — R1032 Left lower quadrant pain: Secondary | ICD-10-CM

## 2014-09-03 NOTE — Progress Notes (Signed)
Subjective:     Patient ID: Victoria Page, female   DOB: 06/14/1956, 58 y.o.   MRN: [id number]  HPI Victoria Page is a 58 year old white female in for US for LLQ pain.  Review of Systems  See HPI Reviewed past medical,surgical, social and family history. Reviewed medications and allergies.     Objective:   Physical Exam BP 128/82 mmHg  Ht 5\' 4"  (1.626 m)  Wt 172 lb 8 oz (78.245 kg)  BMI 29.59 kg/m2Reviewed US with pt.   Walls=3.0 mm with small amount of fluid noted within the cavity which outlines symmetrical walls  Right ovary 1.9 x 1.6 x 1.1 cm,   Left ovary 1.6 x 1.0 x 0.9 cm,   No free fluid or adnexal masses noted within the pelvis  Technician Comments:  Anteverted uterus, Endometrium= Walls=3.640mm with small amount of fluid noted within the cavity which outline symmetrical walls, bilateral adnexa/ovaries appear WNL, no free fluid or adnexal masses noted within the pelvis, pain is not gyn probably IBS related, as she has had for years. Assessment:    LLQ probably related to IBS    Plan:    Follow up prn

## 2014-09-03 NOTE — Patient Instructions (Signed)
Follow up prn

## 2015-11-26 ENCOUNTER — Emergency Department (HOSPITAL_COMMUNITY)
Admission: EM | Admit: 2015-11-26 | Discharge: 2015-11-26 | Disposition: A | Discharge status: Home / Self Care | Payer: BLUE CROSS/BLUE SHIELD | Source: Home / Self Care | Attending: Emergency Medicine | Admitting: Emergency Medicine

## 2015-11-26 ENCOUNTER — Encounter (HOSPITAL_COMMUNITY): Payer: Self-pay | Admitting: Emergency Medicine

## 2015-11-26 DIAGNOSIS — S76312A Strain of muscle, fascia and tendon of the posterior muscle group at thigh level, left thigh, initial encounter: Secondary | ICD-10-CM

## 2015-11-26 MED ORDER — MELOXICAM 15 MG PO TABS: 15.0000 mg | ORAL_TABLET | Freq: Every day | ORAL | Status: DC

## 2015-11-26 MED ORDER — TRAMADOL HCL 50 MG PO TABS: 50.0000 mg | ORAL_TABLET | Freq: Four times a day (QID) | ORAL | Status: DC | PRN

## 2015-11-26 NOTE — ED Provider Notes (Signed)
CSN: [id number]     Arrival date & time 11/26/15  1539 History   First MD Initiated Contact with Patient 11/26/15 1633     Chief Complaint  Patient presents with  . Leg Pain   (Consider location/radiation/quality/duration/timing/severity/associated sxs/prior Treatment) HPI  She is a 60 year old woman here for evaluation of right leg pain. She states for the last 3-4 months she has had discomfort in the posterior thigh that goes down to the knee. She denies any injury or trauma. No change in activity that triggered the pain that she can identify. She states since the pain started, she has been unable to do her normal walking as it causes discomfort in the back of her leg. Pain is worse when she lies on either side at night and with walking. It is better if she can lay with the leg stretched out. She has been taking ibuprofen with some improvement. She slipped down some stairs last night, which exacerbated the pain. She states she feels a similar, but much more mild, pain in the left leg.  Past Medical History  Diagnosis Date  . Hypertension   . GERD (gastroesophageal reflux disease)   . IBS (irritable bowel syndrome)   . LLQ pain 08/19/2014  . Gallstones   . Plantar fasciitis    Past Surgical History  Procedure Laterality Date  . No past surgeries     Family History  Problem Relation Age of Onset  . Lung cancer Mother   . Heart attack Father   . Diverticulitis Paternal Grandmother   . Diverticulitis Brother     died from septic shock  . Cancer Brother     lung   Social History  Substance Use Topics  . Smoking status: Never Smoker   . Smokeless tobacco: Never Used  . Alcohol Use: Yes     Comment: occ   OB History    Gravida Para Term Preterm AB TAB SAB Ectopic Multiple Living   4 3   1  1   3      Review of Systems As in history of present illness Allergies  Review of patient's allergies indicates no known allergies.  Home Medications   Prior to Admission medications    Medication Sig Start Date End Date Taking? Authorizing Provider  hydrochlorothiazide (HYDRODIURIL) 25 MG tablet Take 25 mg by mouth daily.    Historical Provider, MD  meloxicam (MOBIC) 15 MG tablet Take 1 tablet (15 mg total) by mouth daily. 11/26/15   Charm RingsErin J Shaleah Nissley, MD  traMADol (ULTRAM) 50 MG tablet Take 1 tablet (50 mg total) by mouth every 6 (six) hours as needed. 11/26/15   Charm RingsErin J Noella Kipnis, MD   Meds Ordered and Administered this Visit  Medications - No data to display  BP 160/100 mmHg  Pulse 83  Temp(Src) 98.7 F (37.1 C) (Oral)  SpO2 98% No data found.   Physical Exam  Constitutional: She is oriented to person, place, and time. She appears well-developed and well-nourished. No distress.  Cardiovascular: Normal rate.   Pulmonary/Chest: Effort normal.  Musculoskeletal:  Right leg: Full active range of motion of hip and knee with out discomfort. She is tender at the insertion site of the medial hamstrings. Some discomfort with hamstring testing. Normal strength in hip flexion, hamstrings, and quadriceps.  Neurological: She is alert and oriented to person, place, and time.    ED Course  Procedures (including critical care time)  Labs Review Labs Reviewed - No data to display  Imaging Review  No results found.   MDM   1. Hamstring strain, left, initial encounter    Treat with ice/heat, gentle stretching, and meloxicam. Prescription given for tramadol to use at night as needed for pain. If not improving in the next 2 weeks, follow-up with sports medicine.    Charm Rings, MD 11/26/15 [phone]

## 2015-11-26 NOTE — ED Notes (Signed)
Here with left leg pain lateral area radiating to back of knee Started 3-4 months ago, aggravated last night after slipping down steps @ home Pulling, throb pain reported Taking Ibuprofen for relief BP elevated 160/100- taking HCTZ 25 mg tab daily

## 2015-11-26 NOTE — Discharge Instructions (Signed)
You have strained hamstring muscle in your leg. Take meloxicam daily for the next week, then as needed for pain. Alternate ice and heat to the area of pain as often as you can. Gently stretch your hamstring. Use the tramadol at bedtime as needed to help you sleep. This will take 1-2 weeks to improve. If this is not improving in 2 weeks, please follow-up with the sports medicine center.

## 2015-12-15 ENCOUNTER — Telehealth: Payer: Self-pay

## 2015-12-15 NOTE — Telephone Encounter (Signed)
Spoke with patient to schedule appointment with Dr Romeo AppleHarrison per Dr Margo AyeHall request for hamstring pain.  Patient was unable to schedule appointment and will call back.

## 2015-12-18 IMAGING — US US ABDOMEN COMPLETE
1 series · 13 of 25 positions shown · non-contrast
Comparison: Prior abdominal ultrasound 01/24/2012

CLINICAL DATA: 58-year-old female with several month history of
chronic generalized abdominal pain

EXAM:
ULTRASOUND ABDOMEN COMPLETE

[Series 1: us abdomen complete · 0.16mm/px · 13 of 119 slices shown]
[im 1/119]
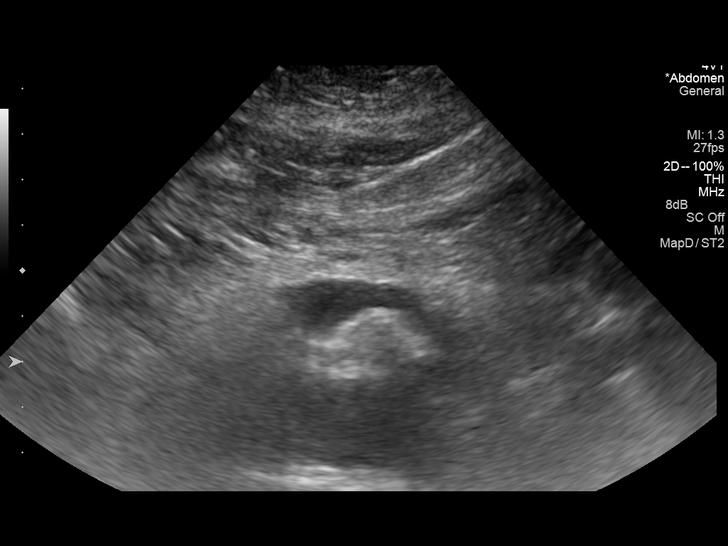
[im 10/119]
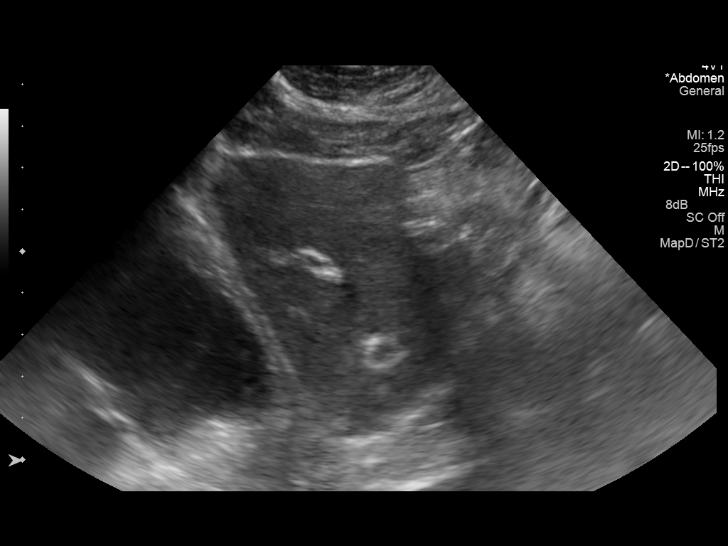
[im 20/119]
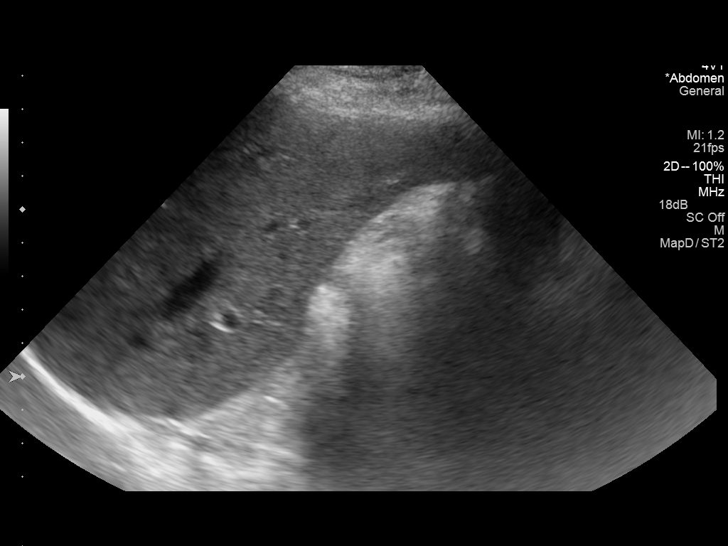
[im 30/119]
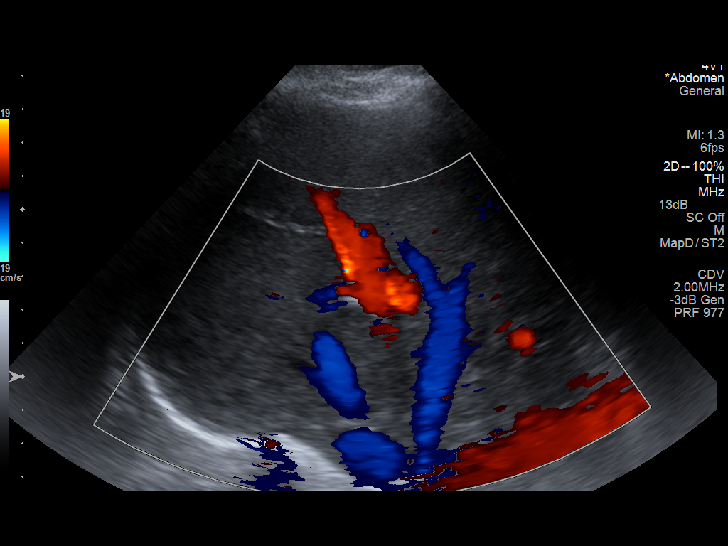
[im 40/119]
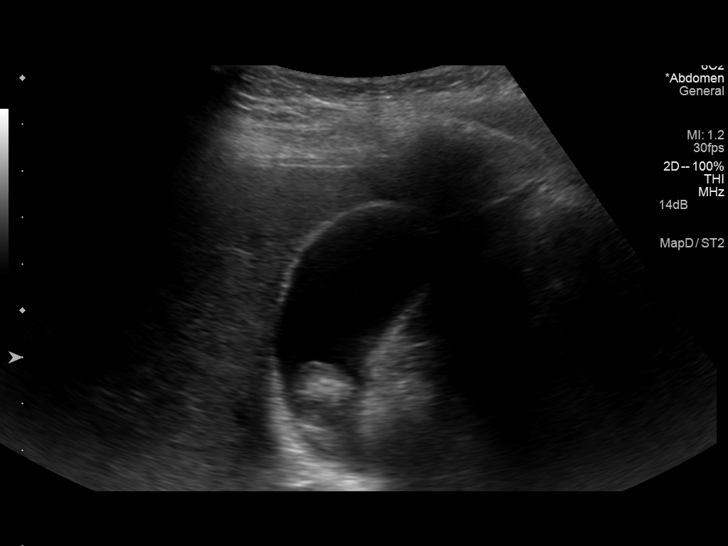
[im 50/119]
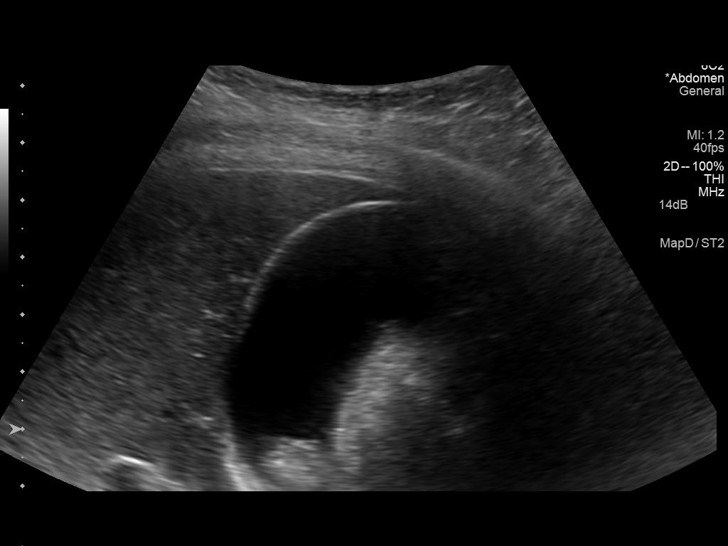
[im 60/119]
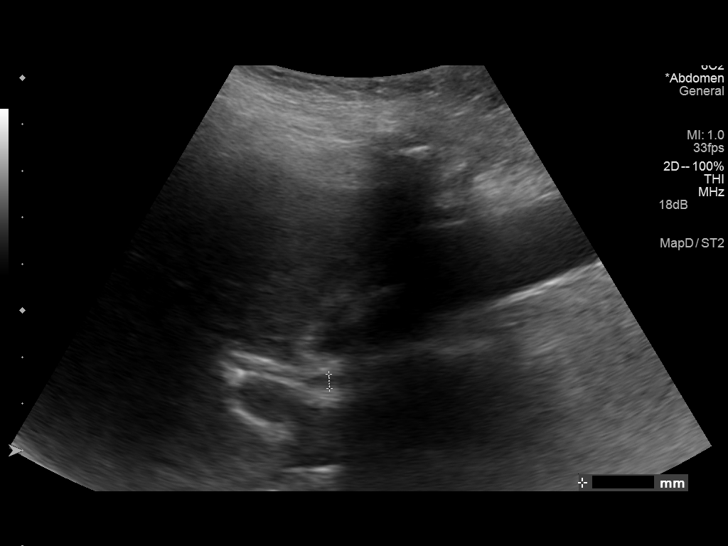
[im 69/119]
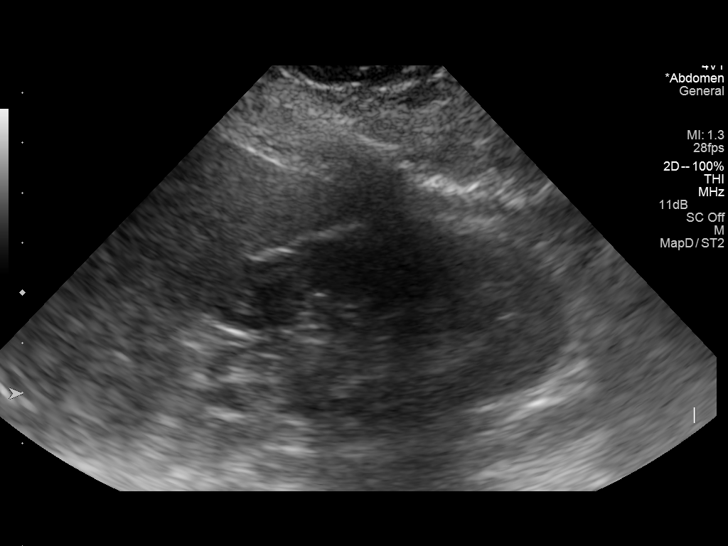
[im 79/119]
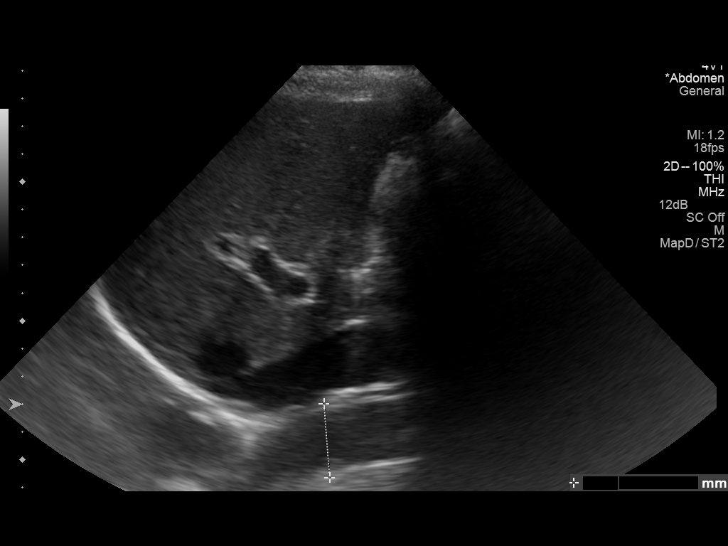
[im 89/119]
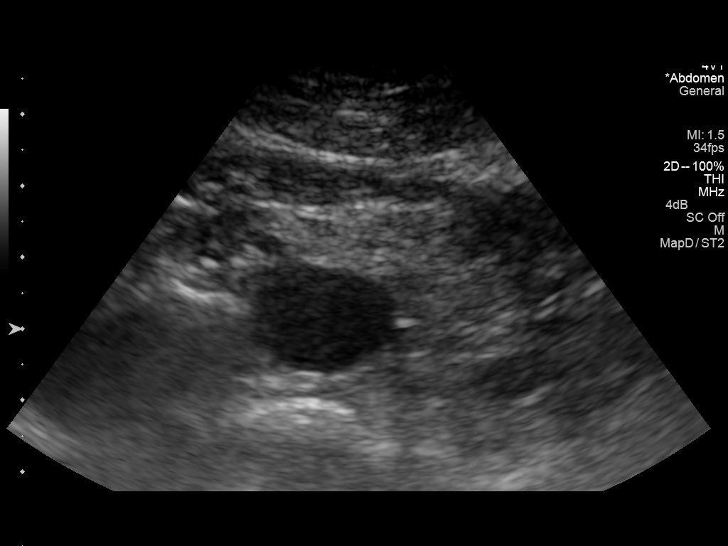
[im 99/119]
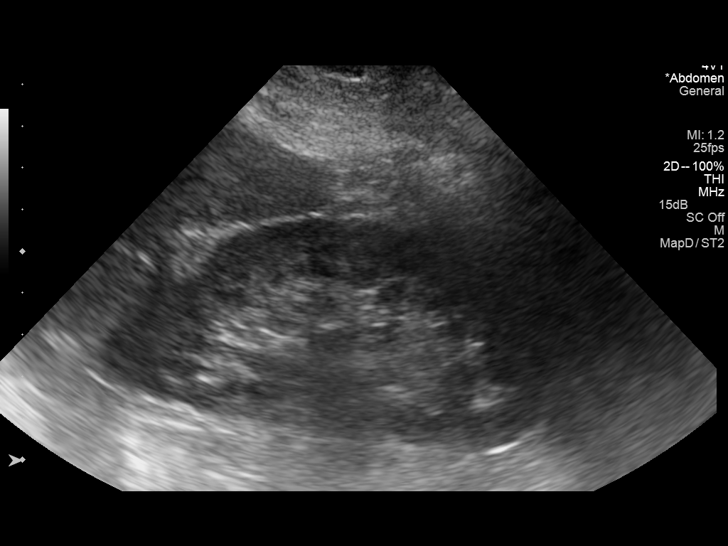
[im 109/119]
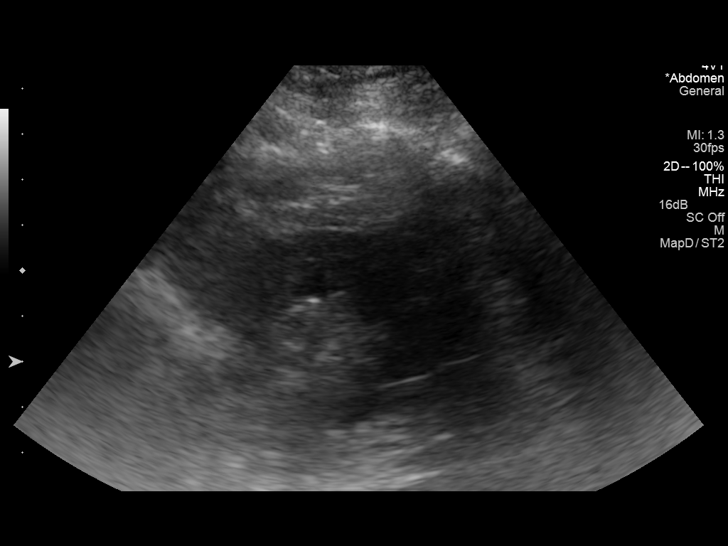
[im 119/119]
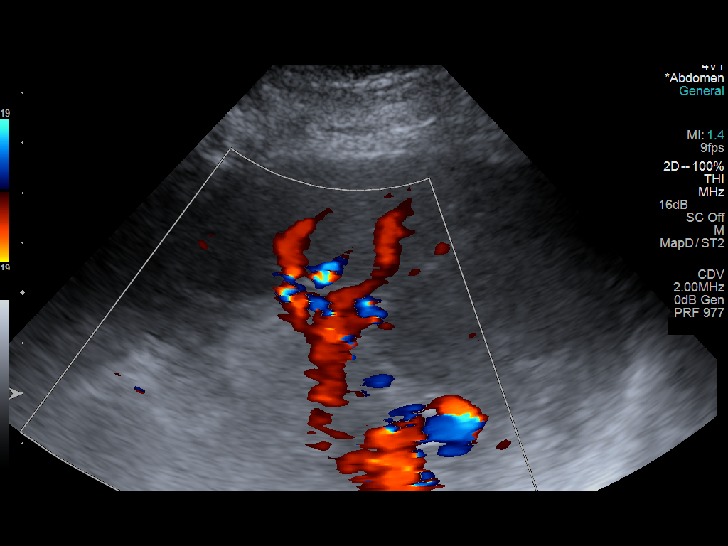

[13 of 25 positions shown; findings below may reference images not displayed]

FINDINGS: Gallbladder: Echogenic focus with posterior acoustic shadowing
measuring up to 2 cm consistent with cholelithiasis. Additionally,
there is some minimally echogenic material layering in the
gallbladder neck likely reflecting the presence of sludge. No
evidence of gallbladder wall thickening or pericholecystic fluid.
Per the sonographer, sonographic Murphy sign was negative.

Common bile duct: Diameter: Within normal limits at 3 mm.

Liver: No focal lesion identified. Within normal limits in
parenchymal echogenicity. The main portal vein is patent with normal
hepatopetal flow.

IVC: No abnormality visualized.

Pancreas: Visualized portion unremarkable.

Spleen: Size and appearance within normal limits.

Right Kidney: Length: 9.6 cm. Echogenicity within normal limits. No
mass or hydronephrosis visualized.

Left Kidney: Length: 11.5 cm. Echogenicity within normal limits. No
mass or hydronephrosis visualized.

Abdominal aorta: No aneurysm visualized.

Other findings: None.
IMPRESSION: Cholelithiasis without sonographic features to suggest acute
cholecystitis.

## 2016-01-06 DIAGNOSIS — M79604 Pain in right leg: Secondary | ICD-10-CM | POA: Diagnosis not present

## 2016-01-06 DIAGNOSIS — M76891 Other specified enthesopathies of right lower limb, excluding foot: Secondary | ICD-10-CM | POA: Diagnosis not present

## 2016-01-06 DIAGNOSIS — M25561 Pain in right knee: Secondary | ICD-10-CM | POA: Diagnosis not present

## 2016-01-06 DIAGNOSIS — M5441 Lumbago with sciatica, right side: Secondary | ICD-10-CM | POA: Diagnosis not present

## 2016-01-12 DIAGNOSIS — M79651 Pain in right thigh: Secondary | ICD-10-CM | POA: Diagnosis not present

## 2016-01-12 DIAGNOSIS — M62451 Contracture of muscle, right thigh: Secondary | ICD-10-CM | POA: Diagnosis not present

## 2016-01-12 DIAGNOSIS — M25561 Pain in right knee: Secondary | ICD-10-CM | POA: Diagnosis not present

## 2016-01-12 DIAGNOSIS — M25461 Effusion, right knee: Secondary | ICD-10-CM | POA: Diagnosis not present

## 2016-01-18 DIAGNOSIS — M62451 Contracture of muscle, right thigh: Secondary | ICD-10-CM | POA: Diagnosis not present

## 2016-01-18 DIAGNOSIS — M79651 Pain in right thigh: Secondary | ICD-10-CM | POA: Diagnosis not present

## 2016-01-18 DIAGNOSIS — M25561 Pain in right knee: Secondary | ICD-10-CM | POA: Diagnosis not present

## 2016-01-18 DIAGNOSIS — M25461 Effusion, right knee: Secondary | ICD-10-CM | POA: Diagnosis not present

## 2016-01-19 DIAGNOSIS — M79651 Pain in right thigh: Secondary | ICD-10-CM | POA: Diagnosis not present

## 2016-01-19 DIAGNOSIS — M62451 Contracture of muscle, right thigh: Secondary | ICD-10-CM | POA: Diagnosis not present

## 2016-01-19 DIAGNOSIS — M25561 Pain in right knee: Secondary | ICD-10-CM | POA: Diagnosis not present

## 2016-01-19 DIAGNOSIS — M25461 Effusion, right knee: Secondary | ICD-10-CM | POA: Diagnosis not present

## 2016-01-25 DIAGNOSIS — M62451 Contracture of muscle, right thigh: Secondary | ICD-10-CM | POA: Diagnosis not present

## 2016-01-25 DIAGNOSIS — M79651 Pain in right thigh: Secondary | ICD-10-CM | POA: Diagnosis not present

## 2016-01-25 DIAGNOSIS — M25461 Effusion, right knee: Secondary | ICD-10-CM | POA: Diagnosis not present

## 2016-01-25 DIAGNOSIS — M25561 Pain in right knee: Secondary | ICD-10-CM | POA: Diagnosis not present

## 2016-02-07 ENCOUNTER — Encounter: Payer: Self-pay | Admitting: Orthopedic Surgery

## 2016-02-14 DIAGNOSIS — M76891 Other specified enthesopathies of right lower limb, excluding foot: Secondary | ICD-10-CM | POA: Diagnosis not present

## 2016-02-16 DIAGNOSIS — M25561 Pain in right knee: Secondary | ICD-10-CM | POA: Diagnosis not present

## 2016-02-16 DIAGNOSIS — M76891 Other specified enthesopathies of right lower limb, excluding foot: Secondary | ICD-10-CM | POA: Diagnosis not present

## 2016-03-01 DIAGNOSIS — M76891 Other specified enthesopathies of right lower limb, excluding foot: Secondary | ICD-10-CM | POA: Diagnosis not present

## 2016-03-15 ENCOUNTER — Other Ambulatory Visit: Payer: Self-pay | Admitting: Obstetrics and Gynecology

## 2016-03-15 DIAGNOSIS — Z1231 Encounter for screening mammogram for malignant neoplasm of breast: Secondary | ICD-10-CM

## 2016-03-16 ENCOUNTER — Ambulatory Visit (HOSPITAL_COMMUNITY)
Admission: RE | Admit: 2016-03-16 | Discharge: 2016-03-16 | Disposition: A | Discharge status: Home / Self Care | Payer: BLUE CROSS/BLUE SHIELD | Source: Ambulatory Visit | Attending: Obstetrics and Gynecology | Admitting: Obstetrics and Gynecology

## 2016-03-16 DIAGNOSIS — Z1231 Encounter for screening mammogram for malignant neoplasm of breast: Secondary | ICD-10-CM

## 2016-05-30 DIAGNOSIS — E782 Mixed hyperlipidemia: Secondary | ICD-10-CM | POA: Diagnosis not present

## 2016-06-07 DIAGNOSIS — M25561 Pain in right knee: Secondary | ICD-10-CM | POA: Diagnosis not present

## 2016-06-07 DIAGNOSIS — K219 Gastro-esophageal reflux disease without esophagitis: Secondary | ICD-10-CM | POA: Diagnosis not present

## 2016-06-07 DIAGNOSIS — E782 Mixed hyperlipidemia: Secondary | ICD-10-CM | POA: Diagnosis not present

## 2016-06-07 DIAGNOSIS — I10 Essential (primary) hypertension: Secondary | ICD-10-CM | POA: Diagnosis not present

## 2016-06-12 DIAGNOSIS — H10409 Unspecified chronic conjunctivitis, unspecified eye: Secondary | ICD-10-CM | POA: Diagnosis not present

## 2016-06-12 DIAGNOSIS — Z6830 Body mass index (BMI) 30.0-30.9, adult: Secondary | ICD-10-CM | POA: Diagnosis not present

## 2016-06-14 DIAGNOSIS — M7121 Synovial cyst of popliteal space [Baker], right knee: Secondary | ICD-10-CM | POA: Diagnosis not present

## 2016-06-14 DIAGNOSIS — M25561 Pain in right knee: Secondary | ICD-10-CM | POA: Diagnosis not present

## 2016-06-14 DIAGNOSIS — M76891 Other specified enthesopathies of right lower limb, excluding foot: Secondary | ICD-10-CM | POA: Diagnosis not present

## 2016-06-16 ENCOUNTER — Other Ambulatory Visit (HOSPITAL_COMMUNITY): Payer: Self-pay | Admitting: Internal Medicine

## 2016-06-16 DIAGNOSIS — R599 Enlarged lymph nodes, unspecified: Secondary | ICD-10-CM

## 2016-06-23 DIAGNOSIS — M7121 Synovial cyst of popliteal space [Baker], right knee: Secondary | ICD-10-CM | POA: Diagnosis not present

## 2016-06-28 DIAGNOSIS — M25561 Pain in right knee: Secondary | ICD-10-CM | POA: Diagnosis not present

## 2016-06-28 DIAGNOSIS — M1711 Unilateral primary osteoarthritis, right knee: Secondary | ICD-10-CM | POA: Diagnosis not present

## 2016-06-28 DIAGNOSIS — M7121 Synovial cyst of popliteal space [Baker], right knee: Secondary | ICD-10-CM | POA: Diagnosis not present

## 2016-06-28 DIAGNOSIS — M76891 Other specified enthesopathies of right lower limb, excluding foot: Secondary | ICD-10-CM | POA: Diagnosis not present

## 2016-07-12 ENCOUNTER — Ambulatory Visit (HOSPITAL_COMMUNITY): Payer: BLUE CROSS/BLUE SHIELD

## 2016-07-12 ENCOUNTER — Ambulatory Visit (HOSPITAL_COMMUNITY)
Admission: RE | Admit: 2016-07-12 | Discharge: 2016-07-12 | Disposition: A | Discharge status: Home / Self Care | Payer: BLUE CROSS/BLUE SHIELD | Source: Ambulatory Visit | Attending: Internal Medicine | Admitting: Internal Medicine

## 2016-07-12 ENCOUNTER — Other Ambulatory Visit (HOSPITAL_COMMUNITY): Payer: Self-pay | Admitting: Internal Medicine

## 2016-07-12 DIAGNOSIS — R590 Localized enlarged lymph nodes: Secondary | ICD-10-CM | POA: Diagnosis not present

## 2016-07-12 DIAGNOSIS — R1031 Right lower quadrant pain: Secondary | ICD-10-CM | POA: Insufficient documentation

## 2016-07-12 DIAGNOSIS — R109 Unspecified abdominal pain: Secondary | ICD-10-CM | POA: Diagnosis not present

## 2016-07-12 DIAGNOSIS — R52 Pain, unspecified: Secondary | ICD-10-CM

## 2016-07-12 DIAGNOSIS — R599 Enlarged lymph nodes, unspecified: Secondary | ICD-10-CM | POA: Diagnosis not present

## 2016-07-12 LAB — HM DEXA SCAN

## 2016-07-12 MED ORDER — IOPAMIDOL (ISOVUE-300) INJECTION 61%
100.0000 mL | Freq: Once | INTRAVENOUS | Status: AC | PRN
  Administered 2016-07-12: 100 mL via INTRAVENOUS

## 2016-08-03 ENCOUNTER — Encounter (INDEPENDENT_AMBULATORY_CARE_PROVIDER_SITE_OTHER): Payer: Self-pay | Admitting: Internal Medicine

## 2016-08-03 ENCOUNTER — Encounter (INDEPENDENT_AMBULATORY_CARE_PROVIDER_SITE_OTHER): Payer: Self-pay

## 2016-08-15 ENCOUNTER — Encounter (INDEPENDENT_AMBULATORY_CARE_PROVIDER_SITE_OTHER): Payer: Self-pay | Admitting: Internal Medicine

## 2016-08-15 ENCOUNTER — Telehealth (INDEPENDENT_AMBULATORY_CARE_PROVIDER_SITE_OTHER): Payer: Self-pay | Admitting: *Deleted

## 2016-08-15 ENCOUNTER — Encounter (INDEPENDENT_AMBULATORY_CARE_PROVIDER_SITE_OTHER): Payer: Self-pay | Admitting: *Deleted

## 2016-08-15 ENCOUNTER — Other Ambulatory Visit (INDEPENDENT_AMBULATORY_CARE_PROVIDER_SITE_OTHER): Payer: Self-pay | Admitting: Internal Medicine

## 2016-08-15 ENCOUNTER — Encounter (INDEPENDENT_AMBULATORY_CARE_PROVIDER_SITE_OTHER): Payer: Self-pay

## 2016-08-15 ENCOUNTER — Ambulatory Visit (INDEPENDENT_AMBULATORY_CARE_PROVIDER_SITE_OTHER): Payer: BLUE CROSS/BLUE SHIELD | Admitting: Internal Medicine

## 2016-08-15 VITALS — BP 120/80 | HR 64 | Temp 98.1°F | Ht 64.0 in | Wt <= 1120 oz

## 2016-08-15 DIAGNOSIS — K588 Other irritable bowel syndrome: Secondary | ICD-10-CM

## 2016-08-15 DIAGNOSIS — R1032 Left lower quadrant pain: Secondary | ICD-10-CM

## 2016-08-15 DIAGNOSIS — K8689 Other specified diseases of pancreas: Secondary | ICD-10-CM

## 2016-08-15 LAB — AMYLASE: Amylase: 40 U/L (ref 0–105)

## 2016-08-15 LAB — LIPASE: Lipase: 15 U/L (ref 7–60)

## 2016-08-15 NOTE — Progress Notes (Addendum)
   Subjective:    Patient ID: Victoria Page, female    DOB: [birth year], 60 y.o.   MRN: [id number]  HPI Referred by Dr. Hughie ClossZ. Hall for LLQ pain and epigastric pain. She has pain when she eats. She also c/o  pain to her left upper quadrant  . She also says she has had the pain in her LLQ for years. Her last colonoscopy was about 14 yrs ago by [facility] and she reports it was normal. Underwent a CT abdomen/pelvis with CM which revealed IMPRESSION: Diffuse decreased attenuation in the head and uncinate process of the pancreas consistent with focal fatty infiltration. No definitive mass lesion is seen. Today she states she is not having any pain. She has a BM at least 2 a day. No melena or BRRB. Her appetite has remained good. No weight loss.  05/31/2016 total bili 0.7, ALP 85, AST 17, ALT 13, H and H 14.9  Review of Systems Past Medical History:  Diagnosis Date  . Gallstones   . GERD (gastroesophageal reflux disease)   . Hypertension   . IBS (irritable bowel syndrome)   . LLQ pain 08/19/2014  . Plantar fasciitis     Past Surgical History:  Procedure Laterality Date  . NO PAST SURGERIES      No Known Allergies  Current Outpatient Prescriptions on File Prior to Visit  Medication Sig Dispense Refill  . hydrochlorothiazide (HYDRODIURIL) 25 MG tablet Take 25 mg by mouth daily.    . [DISCONTINUED] dicyclomine (BENTYL) 10 MG capsule Take 1 capsule (10 mg total) by mouth 2 (two) times daily. (Patient taking differently: Take 10 mg by mouth as needed. ) 60 capsule 2   No current facility-administered medications on file prior to visit.        Objective:   Physical Exam Blood pressure 120/80, pulse 64, temperature 98.1 F (36.7 C), height 5\' 4"  (1.626 m), weight 18 lb 4.8 oz (8.301 kg). Alert and oriented. Skin warm and dry. Oral mucosa is moist.   . Sclera anicteric, conjunctivae is pink. Thyroid not enlarged. No cervical lymphadenopathy. Lungs clear. Heart regular rate and rhythm.  Abdomen  is soft. Bowel sounds are positive. No hepatomegaly. No abdominal masses felt. No tenderness.  No edema to lower extremities.          Assessment & Plan:  )Abdominal pain chronic (LLQ) Hx of IBS. Screening colonoscopy. The risks and benefits such as perforation, bleeding, and infection were reviewed with the patient and is agreeable. Fatty pancreas: Amylase, Lipase.

## 2016-08-15 NOTE — Patient Instructions (Signed)
Colonoscopy.  The risks and benefits such as perforation, bleeding, and infection were reviewed with the patient and is agreeable. 

## 2016-08-15 NOTE — Telephone Encounter (Signed)
Patient needs trilyte 

## 2016-08-17 MED ORDER — PEG 3350-KCL-NA BICARB-NACL 420 G PO SOLR: 4000.0000 mL | Freq: Once | ORAL | 0 refills | Status: AC

## 2016-09-05 ENCOUNTER — Other Ambulatory Visit (INDEPENDENT_AMBULATORY_CARE_PROVIDER_SITE_OTHER): Payer: Self-pay | Admitting: Internal Medicine

## 2016-09-05 DIAGNOSIS — Z1211 Encounter for screening for malignant neoplasm of colon: Secondary | ICD-10-CM

## 2016-09-06 ENCOUNTER — Encounter (HOSPITAL_COMMUNITY): Payer: Self-pay | Admitting: *Deleted

## 2016-09-06 ENCOUNTER — Ambulatory Visit (HOSPITAL_COMMUNITY)
Admission: RE | Admit: 2016-09-06 | Discharge: 2016-09-06 | Disposition: A | Discharge status: Home / Self Care | Payer: BLUE CROSS/BLUE SHIELD | Source: Ambulatory Visit | Attending: Internal Medicine | Admitting: Internal Medicine

## 2016-09-06 ENCOUNTER — Ambulatory Visit (HOSPITAL_COMMUNITY)
Admission: RE | Admit: 2016-09-06 | Payer: BLUE CROSS/BLUE SHIELD | Source: Ambulatory Visit | Admitting: Internal Medicine

## 2016-09-06 ENCOUNTER — Encounter (HOSPITAL_COMMUNITY): Admission: RE | Payer: Self-pay | Source: Ambulatory Visit

## 2016-09-06 ENCOUNTER — Encounter (HOSPITAL_COMMUNITY)
Admission: RE | Disposition: A | Discharge status: Home / Self Care | Payer: Self-pay | Source: Ambulatory Visit | Attending: Internal Medicine

## 2016-09-06 DIAGNOSIS — K589 Irritable bowel syndrome without diarrhea: Secondary | ICD-10-CM | POA: Diagnosis not present

## 2016-09-06 DIAGNOSIS — Z79899 Other long term (current) drug therapy: Secondary | ICD-10-CM | POA: Insufficient documentation

## 2016-09-06 DIAGNOSIS — I10 Essential (primary) hypertension: Secondary | ICD-10-CM | POA: Diagnosis not present

## 2016-09-06 DIAGNOSIS — Q438 Other specified congenital malformations of intestine: Secondary | ICD-10-CM | POA: Diagnosis not present

## 2016-09-06 DIAGNOSIS — R1032 Left lower quadrant pain: Secondary | ICD-10-CM

## 2016-09-06 DIAGNOSIS — K644 Residual hemorrhoidal skin tags: Secondary | ICD-10-CM | POA: Diagnosis not present

## 2016-09-06 DIAGNOSIS — K219 Gastro-esophageal reflux disease without esophagitis: Secondary | ICD-10-CM | POA: Diagnosis not present

## 2016-09-06 DIAGNOSIS — Z1211 Encounter for screening for malignant neoplasm of colon: Secondary | ICD-10-CM | POA: Diagnosis not present

## 2016-09-06 HISTORY — PX: COLONOSCOPY: SHX5424

## 2016-09-06 LAB — HM COLONOSCOPY

## 2016-09-06 SURGERY — COLONOSCOPY
Anesthesia: Moderate Sedation

## 2016-09-06 MED ORDER — DICYCLOMINE HCL 10 MG PO CAPS
10.0000 mg | ORAL_CAPSULE | Freq: Three times a day (TID) | ORAL | 2 refills | Status: DC | PRN
Start: 2016-09-06 — End: 2017-08-17

## 2016-09-06 MED ORDER — SODIUM CHLORIDE 0.9 % IV SOLN
INTRAVENOUS | Status: DC
  Administered 2016-09-06: 08:00:00 via INTRAVENOUS

## 2016-09-06 MED ORDER — MIDAZOLAM HCL 5 MG/5ML IJ SOLN
INTRAMUSCULAR | Status: DC | PRN
  Administered 2016-09-06 (×3): 2 mg via INTRAVENOUS

## 2016-09-06 MED ORDER — MIDAZOLAM HCL 5 MG/5ML IJ SOLN
INTRAMUSCULAR | Status: AC
  Filled 2016-09-06: qty 10

## 2016-09-06 MED ORDER — MEPERIDINE HCL 50 MG/ML IJ SOLN
INTRAMUSCULAR | Status: DC
Start: 2016-09-06 — End: 2016-09-06
  Filled 2016-09-06: qty 1

## 2016-09-06 MED ORDER — MEPERIDINE HCL 50 MG/ML IJ SOLN
INTRAMUSCULAR | Status: DC | PRN
Start: 2016-09-06 — End: 2016-09-06
  Administered 2016-09-06 (×2): 25 mg via INTRAVENOUS

## 2016-09-06 NOTE — Discharge Instructions (Signed)
Resume usual medications and diet. Dicyclomine 10 mg by mouth 30 minutes before each meal or 3 times a day as needed. No driving for 24 hours. Please call office with progress report in 4 weeks. Next screening in 10 years.   Colonoscopy, Adult, Care After This sheet gives you information about how to care for yourself after your procedure. Your doctor may also give you more specific instructions. If you have problems or questions, call your doctor. Follow these instructions at home: General instructions  For the first 24 hours after the procedure:  Do not drive or use machinery.  Do not sign important documents.  Do not drink alcohol.  Do your daily activities more slowly than normal.  Eat foods that are soft and easy to digest.  Rest often.  Take over-the-counter or prescription medicines only as told by your doctor.  It is up to you to get the results of your procedure. Ask your doctor, or the department performing the procedure, when your results will be ready. To help cramping and bloating:  Try walking around.  Put heat on your belly (abdomen) as told by your doctor. Use a heat source that your doctor recommends, such as a moist heat pack or a heating pad.  Put a towel between your skin and the heat source.  Leave the heat on for 20-30 minutes.  Remove the heat if your skin turns bright red. This is especially important if you cannot feel pain, heat, or cold. You can get burned. Eating and drinking  Drink enough fluid to keep your pee (urine) clear or pale yellow.  Return to your normal diet as told by your doctor. Avoid heavy or fried foods that are hard to digest.  Avoid drinking alcohol for as long as told by your doctor. Contact a doctor if:  You have blood in your poop (stool) 2-3 days after the procedure. Get help right away if:  You have more than a small amount of blood in your poop.  You see large clumps of tissue (blood clots) in your poop.  Your  belly is swollen.  You feel sick to your stomach (nauseous).  You throw up (vomit).  You have a fever.  You have belly pain that gets worse, and medicine does not help your pain. This information is not intended to replace advice given to you by your health care provider. Make sure you discuss any questions you have with your health care provider. Document Released: 09/23/2010 Document Revised: 05/15/2016 Document Reviewed: 05/15/2016 Elsevier Interactive Patient Education  2017 [employer].    Hemorrhoids Hemorrhoids are swollen veins in and around the rectum or anus. There are two types of hemorrhoids:  Internal hemorrhoids. These occur in the veins that are just inside the rectum. They may poke through to the outside and become irritated and painful.  External hemorrhoids. These occur in the veins that are outside of the anus and can be felt as a painful swelling or hard lump near the anus. Most hemorrhoids do not cause serious problems, and they can be managed with home treatments such as diet and lifestyle changes. If home treatments do not help your symptoms, procedures can be done to shrink or remove the hemorrhoids. What are the causes? This condition is caused by increased pressure in the anal area. This pressure may result from various things, including:  Constipation.  Straining to have a bowel movement.  Diarrhea.  Pregnancy.  Obesity.  Sitting for long periods of time.  Heavy lifting or other activity that causes you to strain.  Anal sex. What are the signs or symptoms? Symptoms of this condition include:  Pain.  Anal itching or irritation.  Rectal bleeding.  Leakage of stool (feces).  Anal swelling.  One or more lumps around the anus. How is this diagnosed? This condition can often be diagnosed through a visual exam. Other exams or tests may also be done, such as:  Examination of the rectal area with a gloved hand (digital rectal  exam).  Examination of the anal canal using a small tube (anoscope).  A blood test, if you have lost a significant amount of blood.  A test to look inside the colon (sigmoidoscopy or colonoscopy). How is this treated? This condition can usually be treated at home. However, various procedures may be done if dietary changes, lifestyle changes, and other home treatments do not help your symptoms. These procedures can help make the hemorrhoids smaller or remove them completely. Some of these procedures involve surgery, and others do not. Common procedures include:  Rubber band ligation. Rubber bands are placed at the base of the hemorrhoids to cut off the blood supply to them.  Sclerotherapy. Medicine is injected into the hemorrhoids to shrink them.  Infrared coagulation. A type of light energy is used to get rid of the hemorrhoids.  Hemorrhoidectomy surgery. The hemorrhoids are surgically removed, and the veins that supply them are tied off.  Stapled hemorrhoidopexy surgery. A circular stapling device is used to remove the hemorrhoids and use staples to cut off the blood supply to them. Follow these instructions at home: Eating and drinking  Eat foods that have a lot of fiber in them, such as whole grains, beans, nuts, fruits, and vegetables. Ask your health care provider about taking products that have added fiber (fiber supplements).  Drink enough fluid to keep your urine clear or pale yellow. Managing pain and swelling  Take warm sitz baths for 20 minutes, 3-4 times a day to ease pain and discomfort.  If directed, apply ice to the affected area. Using ice packs between sitz baths may be helpful.  Put ice in a plastic bag.  Place a towel between your skin and the bag.  Leave the ice on for 20 minutes, 2-3 times a day. General instructions  Take over-the-counter and prescription medicines only as told by your health care provider.  Use medicated creams or suppositories as  told.  Exercise regularly.  Go to the bathroom when you have the urge to have a bowel movement. Do not wait.  Avoid straining to have bowel movements.  Keep the anal area dry and clean. Use wet toilet paper or moist towelettes after a bowel movement.  Do not sit on the toilet for long periods of time. This increases blood pooling and pain. Contact a health care provider if:  You have increasing pain and swelling that are not controlled by treatment or medicine.  You have uncontrolled bleeding.  You have difficulty having a bowel movement, or you are unable to have a bowel movement.  You have pain or inflammation outside the area of the hemorrhoids. This information is not intended to replace advice given to you by your health care provider. Make sure you discuss any questions you have with your health care provider. Document Released: 08/18/2000 Document Revised: 01/19/2016 Document Reviewed: 05/05/2015 Elsevier Interactive Patient Education  2017 [employer]

## 2016-09-06 NOTE — Op Note (Signed)
[facility] Patient Name: Victoria Page Procedure Date: 09/06/2016 8:18 AM MRN: [id number] Date of Birth: Oct 10, 1955 Attending MD: Lionel December , MD CSN: [id number] Age: 61 Admit Type: Outpatient Procedure:                Colonoscopy Indications:              Screening for colorectal malignant neoplasm Providers:                Lionel December, MD, Toniann Fail RN, RN, Birder Robson, Technician Referring MD:             Catalina Pizza, MD Medicines:                Meperidine 50 mg IV, Midazolam 5 mg IV Complications:            No immediate complications. Estimated Blood Loss:     Estimated blood loss: none. Procedure:                Pre-Anesthesia Assessment:                           - Prior to the procedure, a History and Physical                            was performed, and patient medications and                            allergies were reviewed. The patient's tolerance of                            previous anesthesia was also reviewed. The risks                            and benefits of the procedure and the sedation                            options and risks were discussed with the patient.                            All questions were answered, and informed consent                            was obtained. Prior Anticoagulants: The patient                            last took ibuprofen 5 days prior to the procedure.                            ASA Grade Assessment: II - A patient with mild                            systemic disease. After reviewing the risks and  benefits, the patient was deemed in satisfactory                            condition to undergo the procedure.                           After obtaining informed consent, the colonoscope                            was passed under direct vision. Throughout the                            procedure, the patient's blood pressure, pulse, and                             oxygen saturations were monitored continuously. The                            [id number] ([id number]) scope was introduced through                            the anus and advanced to the the cecum, identified                            by appendiceal orifice and ileocecal valve. The                            colonoscopy was somewhat difficult due to a                            tortuous colon. Successful completion of the                            procedure was aided by changing the patient to a                            supine position and applying abdominal pressure.                            The patient tolerated the procedure well. The                            quality of the bowel preparation was good. The                            ileocecal valve, appendiceal orifice, and rectum                            were photographed. Scope In: 8:27:11 AM Scope Out: 8:48:51 AM Scope Withdrawal Time: 0 hours 7 minutes 49 seconds  Total Procedure Duration: 0 hours 21 minutes 40 seconds  Findings:      The perianal and digital rectal examinations were normal.      The colon (entire examined portion) appeared normal.  External hemorrhoids were found during retroflexion. The hemorrhoids       were small. Impression:               - The entire examined colon is normal.                           - External hemorrhoids.                           - No specimens collected. Moderate Sedation:      Moderate (conscious) sedation was administered by the endoscopy nurse       and supervised by the endoscopist. The following parameters were       monitored: oxygen saturation, heart rate, blood pressure, CO2       capnography and response to care. Total physician intraservice time was       25 minutes. Recommendation:           - Patient has a contact number available for                            emergencies. The signs and symptoms of potential                            delayed  complications were discussed with the                            patient. Return to normal activities tomorrow.                            Written discharge instructions were provided to the                            patient.                           - Resume previous diet today.                           - Continue present medications.                           - Use Bentyl (dicyclomine) 10 mg PO 3 times a day                            when necessary.                           - Repeat colonoscopy in 10 years for screening                            purposes. Procedure Code(s):        --- Professional ---                           [phone], Colonoscopy, flexible; diagnostic, including  collection of specimen(s) by brushing or washing,                            when performed (separate procedure)                           99152, Moderate sedation services provided by the                            same physician or other qualified health care                            professional performing the diagnostic or                            therapeutic service that the sedation supports,                            requiring the presence of an independent trained                            observer to assist in the monitoring of the                            patient's level of consciousness and physiological                            status; initial 15 minutes of intraservice time,                            patient age 29 years or older                           [phone], Moderate sedation services; each additional                            15 minutes intraservice time Diagnosis Code(s):        --- Professional ---                           Z12.11, Encounter for screening for malignant                            neoplasm of colon                           K64.4, Residual hemorrhoidal skin tags CPT copyright 2016 [employer] Association. All rights  reserved. The codes documented in this report are preliminary and upon coder review may  be revised to meet current compliance requirements. Lionel DecemberNajeeb Marlynn Hinckley, MD Lionel DecemberNajeeb Gabriella Woodhead, MD 09/06/2016 8:57:27 AM This report has been signed electronically. Number of Addenda: 0

## 2016-09-06 NOTE — H&P (Signed)
Victoria Page is an 61 y.o. female.   Chief Complaint: Patient is here for colonoscopy. HPI: Patient is 61 year old Caucasian female who is here for screening colonoscopy. She has history of IBS. She has intermittent pain predominantly left lower quadrant associated with bowel urgency. She denies rectal bleeding weight loss. She also has occasional pain in epigastrium and right upper quadrant. She denies nausea or vomiting. She says she had ultrasound 7 years ago and was told she had a tiny stone. He takes ibuprofen 800 mg 1-3 times a week. History is negative for CRC.  Past Medical History:  Diagnosis Date  . Gallstones   . GERD (gastroesophageal reflux disease)   . Hypertension   . IBS (irritable bowel syndrome)   . LLQ pain 08/19/2014  . Plantar fasciitis     Past Surgical History:  Procedure Laterality Date  . FLEXIBLE SIGMOIDOSCOPY      Family History  Problem Relation Age of Onset  . Lung cancer Mother   . Heart attack Father   . Diverticulitis Paternal Grandmother   . Diverticulitis Brother     died from septic shock  . Cancer Brother     lung  . Colon cancer Neg Hx    Social History:  reports that she has never smoked. She has never used smokeless tobacco. She reports that she drinks alcohol. She reports that she does not use drugs.  Allergies: No Known Allergies  Medications Prior to Admission  Medication Sig Dispense Refill  . ALPRAZolam (XANAX) 0.5 MG tablet Take 0.5 mg by mouth daily as needed for anxiety.   0  . hydrochlorothiazide (HYDRODIURIL) 25 MG tablet Take 25 mg by mouth daily.    Marland Kitchen. ibuprofen (ADVIL,MOTRIN) 200 MG tablet Take 200 mg by mouth every 6 (six) hours as needed for headache or moderate pain.     . Omega-3 Fatty Acids (FISH OIL PO) Take 1 capsule by mouth daily.      No results found for this or any previous visit (from the past 48 hour(s)). No results found.  ROS  Blood pressure (!) 143/88, pulse 78, temperature 97.4 F (36.3 C),  temperature source Oral, resp. rate 15, height 5' 4.5" (1.638 m), weight 174 lb (78.9 kg), SpO2 99 %. Physical Exam  Constitutional: She appears well-developed and well-nourished.  HENT:  Mouth/Throat: Oropharynx is clear and moist.  Eyes: Conjunctivae are normal. No scleral icterus.  Neck: No thyromegaly present.  Cardiovascular: Normal rate, regular rhythm and normal heart sounds.   No murmur heard. Respiratory: Effort normal and breath sounds normal.  GI: Soft. She exhibits no distension and no mass. There is no tenderness.  Musculoskeletal: She exhibits no edema.  Lymphadenopathy:    She has no cervical adenopathy.  Neurological: She is alert.  Skin: Skin is warm and dry.     Assessment/Plan Average risk screening colonoscopy.  Lionel DecemberNajeeb Starlin Steib, MD 09/06/2016, 8:21 AM

## 2016-09-07 ENCOUNTER — Encounter (HOSPITAL_COMMUNITY): Payer: Self-pay | Admitting: Internal Medicine

## 2016-09-18 DIAGNOSIS — R05 Cough: Secondary | ICD-10-CM | POA: Diagnosis not present

## 2016-09-18 DIAGNOSIS — J060 Acute laryngopharyngitis: Secondary | ICD-10-CM | POA: Diagnosis not present

## 2016-09-18 DIAGNOSIS — Z6830 Body mass index (BMI) 30.0-30.9, adult: Secondary | ICD-10-CM | POA: Diagnosis not present

## 2016-12-04 DIAGNOSIS — E781 Pure hyperglyceridemia: Secondary | ICD-10-CM | POA: Diagnosis not present

## 2016-12-04 DIAGNOSIS — I10 Essential (primary) hypertension: Secondary | ICD-10-CM | POA: Diagnosis not present

## 2016-12-06 DIAGNOSIS — Z01419 Encounter for gynecological examination (general) (routine) without abnormal findings: Secondary | ICD-10-CM | POA: Diagnosis not present

## 2016-12-06 DIAGNOSIS — E782 Mixed hyperlipidemia: Secondary | ICD-10-CM | POA: Diagnosis not present

## 2016-12-06 DIAGNOSIS — K589 Irritable bowel syndrome without diarrhea: Secondary | ICD-10-CM | POA: Diagnosis not present

## 2016-12-06 DIAGNOSIS — I10 Essential (primary) hypertension: Secondary | ICD-10-CM | POA: Diagnosis not present

## 2016-12-06 DIAGNOSIS — Z0001 Encounter for general adult medical examination with abnormal findings: Secondary | ICD-10-CM | POA: Diagnosis not present

## 2016-12-13 DIAGNOSIS — D225 Melanocytic nevi of trunk: Secondary | ICD-10-CM | POA: Diagnosis not present

## 2016-12-13 DIAGNOSIS — L820 Inflamed seborrheic keratosis: Secondary | ICD-10-CM | POA: Diagnosis not present

## 2016-12-13 DIAGNOSIS — L821 Other seborrheic keratosis: Secondary | ICD-10-CM | POA: Diagnosis not present

## 2017-05-10 DIAGNOSIS — Z6831 Body mass index (BMI) 31.0-31.9, adult: Secondary | ICD-10-CM | POA: Diagnosis not present

## 2017-05-10 DIAGNOSIS — M25522 Pain in left elbow: Secondary | ICD-10-CM | POA: Diagnosis not present

## 2017-05-10 DIAGNOSIS — G64 Other disorders of peripheral nervous system: Secondary | ICD-10-CM | POA: Diagnosis not present

## 2017-05-10 DIAGNOSIS — M79632 Pain in left forearm: Secondary | ICD-10-CM | POA: Diagnosis not present

## 2017-06-04 DIAGNOSIS — E782 Mixed hyperlipidemia: Secondary | ICD-10-CM | POA: Diagnosis not present

## 2017-06-04 DIAGNOSIS — I10 Essential (primary) hypertension: Secondary | ICD-10-CM | POA: Diagnosis not present

## 2017-06-06 DIAGNOSIS — E782 Mixed hyperlipidemia: Secondary | ICD-10-CM | POA: Diagnosis not present

## 2017-06-06 DIAGNOSIS — K219 Gastro-esophageal reflux disease without esophagitis: Secondary | ICD-10-CM | POA: Diagnosis not present

## 2017-06-06 DIAGNOSIS — M130 Polyarthritis, unspecified: Secondary | ICD-10-CM | POA: Diagnosis not present

## 2017-06-06 DIAGNOSIS — I10 Essential (primary) hypertension: Secondary | ICD-10-CM | POA: Diagnosis not present

## 2017-06-07 ENCOUNTER — Other Ambulatory Visit (HOSPITAL_COMMUNITY): Payer: Self-pay | Admitting: Internal Medicine

## 2017-06-07 DIAGNOSIS — Z780 Asymptomatic menopausal state: Secondary | ICD-10-CM

## 2017-06-07 DIAGNOSIS — Z1231 Encounter for screening mammogram for malignant neoplasm of breast: Secondary | ICD-10-CM

## 2017-06-13 ENCOUNTER — Ambulatory Visit (HOSPITAL_COMMUNITY): Payer: BLUE CROSS/BLUE SHIELD

## 2017-06-13 ENCOUNTER — Other Ambulatory Visit (HOSPITAL_COMMUNITY): Payer: BLUE CROSS/BLUE SHIELD

## 2017-06-14 ENCOUNTER — Ambulatory Visit (HOSPITAL_COMMUNITY): Payer: BLUE CROSS/BLUE SHIELD

## 2017-06-14 ENCOUNTER — Other Ambulatory Visit (HOSPITAL_COMMUNITY): Payer: BLUE CROSS/BLUE SHIELD

## 2017-06-28 DIAGNOSIS — J019 Acute sinusitis, unspecified: Secondary | ICD-10-CM | POA: Diagnosis not present

## 2017-07-11 ENCOUNTER — Encounter (HOSPITAL_COMMUNITY): Payer: Self-pay

## 2017-07-11 ENCOUNTER — Ambulatory Visit (HOSPITAL_COMMUNITY)
Admission: RE | Admit: 2017-07-11 | Discharge: 2017-07-11 | Disposition: A | Discharge status: Home / Self Care | Payer: BLUE CROSS/BLUE SHIELD | Source: Ambulatory Visit | Attending: Internal Medicine | Admitting: Internal Medicine

## 2017-07-11 DIAGNOSIS — Z1231 Encounter for screening mammogram for malignant neoplasm of breast: Secondary | ICD-10-CM | POA: Diagnosis not present

## 2017-07-11 DIAGNOSIS — Z13820 Encounter for screening for osteoporosis: Secondary | ICD-10-CM | POA: Insufficient documentation

## 2017-07-11 DIAGNOSIS — M8589 Other specified disorders of bone density and structure, multiple sites: Secondary | ICD-10-CM | POA: Diagnosis not present

## 2017-07-11 DIAGNOSIS — Z780 Asymptomatic menopausal state: Secondary | ICD-10-CM

## 2017-08-17 ENCOUNTER — Emergency Department (HOSPITAL_COMMUNITY): Payer: Worker's Compensation

## 2017-08-17 ENCOUNTER — Encounter (HOSPITAL_COMMUNITY): Payer: Self-pay | Admitting: *Deleted

## 2017-08-17 ENCOUNTER — Emergency Department (HOSPITAL_COMMUNITY)
Admission: EM | Admit: 2017-08-17 | Discharge: 2017-08-18 | Disposition: A | Discharge status: Home / Self Care | Payer: Worker's Compensation | Attending: Emergency Medicine | Admitting: Emergency Medicine

## 2017-08-17 ENCOUNTER — Other Ambulatory Visit: Payer: Self-pay

## 2017-08-17 DIAGNOSIS — W000XXA Fall on same level due to ice and snow, initial encounter: Secondary | ICD-10-CM | POA: Insufficient documentation

## 2017-08-17 DIAGNOSIS — Z79899 Other long term (current) drug therapy: Secondary | ICD-10-CM | POA: Insufficient documentation

## 2017-08-17 DIAGNOSIS — Y929 Unspecified place or not applicable: Secondary | ICD-10-CM | POA: Insufficient documentation

## 2017-08-17 DIAGNOSIS — I10 Essential (primary) hypertension: Secondary | ICD-10-CM | POA: Diagnosis not present

## 2017-08-17 DIAGNOSIS — Y999 Unspecified external cause status: Secondary | ICD-10-CM | POA: Insufficient documentation

## 2017-08-17 DIAGNOSIS — Y939 Activity, unspecified: Secondary | ICD-10-CM | POA: Diagnosis not present

## 2017-08-17 DIAGNOSIS — M545 Low back pain: Secondary | ICD-10-CM | POA: Diagnosis not present

## 2017-08-17 DIAGNOSIS — S2231XA Fracture of one rib, right side, initial encounter for closed fracture: Secondary | ICD-10-CM | POA: Diagnosis not present

## 2017-08-17 DIAGNOSIS — S3993XA Unspecified injury of pelvis, initial encounter: Secondary | ICD-10-CM | POA: Diagnosis not present

## 2017-08-17 DIAGNOSIS — S2232XA Fracture of one rib, left side, initial encounter for closed fracture: Secondary | ICD-10-CM

## 2017-08-17 DIAGNOSIS — S3991XA Unspecified injury of abdomen, initial encounter: Secondary | ICD-10-CM | POA: Diagnosis not present

## 2017-08-17 DIAGNOSIS — W19XXXA Unspecified fall, initial encounter: Secondary | ICD-10-CM

## 2017-08-17 LAB — URINALYSIS, DIPSTICK ONLY
Bilirubin Urine: NEGATIVE
Glucose, UA: NEGATIVE mg/dL
Hgb urine dipstick: NEGATIVE
Ketones, ur: NEGATIVE mg/dL
LEUKOCYTES UA: NEGATIVE
NITRITE: NEGATIVE
PROTEIN: NEGATIVE mg/dL
Specific Gravity, Urine: 1.009 (ref 1.005–1.030)
pH: 8 (ref 5.0–8.0)

## 2017-08-17 LAB — I-STAT CHEM 8, ED
BUN: 13 mg/dL (ref 6–20)
Calcium, Ion: 1.18 mmol/L (ref 1.15–1.40)
Chloride: 100 mmol/L — ABNORMAL LOW (ref 101–111)
Creatinine, Ser: 0.6 mg/dL (ref 0.44–1.00)
Glucose, Bld: 99 mg/dL (ref 65–99)
HCT: 47 % — ABNORMAL HIGH (ref 36.0–46.0)
HEMOGLOBIN: 16 g/dL — AB (ref 12.0–15.0)
POTASSIUM: 3.8 mmol/L (ref 3.5–5.1)
Sodium: 141 mmol/L (ref 135–145)
TCO2: 29 mmol/L (ref 22–32)

## 2017-08-17 MED ORDER — IOPAMIDOL (ISOVUE-300) INJECTION 61%
100.0000 mL | Freq: Once | INTRAVENOUS | Status: AC | PRN
  Administered 2017-08-17: 100 mL via INTRAVENOUS

## 2017-08-17 NOTE — ED Provider Notes (Signed)
[facility] EMERGENCY DEPARTMENT Provider Note   CSN: [id number] Arrival date & time: 08/17/17  1930     History   Chief Complaint Chief Complaint  Patient presents with  . Fall    HPI Victoria Page is a 61 y.o. female with a history of HTN and gerd presenting with left chest wall pain and mid to lower back pain since she slipped on an icy ramp several hours before arrival, landing on her left torso and flank.  She endorses left lateral rib pain worsened with deep inspiration, palpation and movement and improved with rest.  She denies sob, cough, neck or head pain and denies hitting her head or any loc. She has had no abdominal distention and denies hematuria.  She reports feeling nauseated after the event which is now improved.  She has had no treatment for her pain since arrival.  The history is provided by the patient.    Past Medical History:  Diagnosis Date  . Gallstones   . GERD (gastroesophageal reflux disease)   . Hypertension   . IBS (irritable bowel syndrome)   . LLQ pain 08/19/2014  . Plantar fasciitis     Patient Active Problem List   Diagnosis Date Noted  . Encounter for screening colonoscopy 09/05/2016  . LLQ pain 08/19/2014  . CERVICAL RADICULITIS 06/12/2007  . SHOULDER PAIN 05/14/2007  . NECK PAIN, ACUTE 05/14/2007  . HIGH BLOOD PRESSURE 03/05/2007    Past Surgical History:  Procedure Laterality Date  . COLONOSCOPY N/A 09/06/2016   Procedure: COLONOSCOPY;  Surgeon: Malissa HippoNajeeb U Rehman, MD;  Location: [facility];  Service: Endoscopy;  Laterality: N/A;  8:30  . FLEXIBLE SIGMOIDOSCOPY      OB History    Gravida Para Term Preterm AB Living   4 3     1 3    SAB TAB Ectopic Multiple Live Births   1               Home Medications    Prior to Admission medications   Medication Sig Start Date End Date Taking? Authorizing Provider  ALPRAZolam Prudy Feeler(XANAX) 0.5 MG tablet Take 0.5 mg by mouth daily as needed for anxiety.  08/02/16  Yes [provider]  calcium-vitamin D (OSCAL WITH D) 500-200 MG-UNIT tablet Take 1 tablet by mouth daily.   Yes [provider]  hydrochlorothiazide (HYDRODIURIL) 25 MG tablet Take 25 mg by mouth daily.   Yes [provider]  IBU 800 MG tablet Take 800 mg by mouth daily as needed for mild pain or moderate pain.  08/16/17  Yes [provider]  Omega-3 Fatty Acids (FISH OIL PO) Take 1 capsule by mouth daily.   Yes [provider]  HYDROcodone-acetaminophen (NORCO/VICODIN) 5-325 MG tablet Take 1 tablet by mouth every 4 (four) hours as needed. 08/18/17   Burgess AmorIdol, Angeleena Dueitt, PA-C  HYDROcodone-acetaminophen (NORCO/VICODIN) 5-325 MG tablet Take 1 tablet by mouth every 4 (four) hours as needed. 08/18/17   Burgess AmorIdol, Joel Cowin, PA-C    Family History Family History  Problem Relation Age of Onset  . Lung cancer Mother   . Heart attack Father   . Diverticulitis Paternal Grandmother   . Diverticulitis Brother        died from septic shock  . Cancer Brother        lung  . Colon cancer Neg Hx     Social History Social History   Tobacco Use  . Smoking status: Never Smoker  . Smokeless tobacco: Never  Used  Substance Use Topics  . Alcohol use: Yes    Comment: "Once every 2 months"  . Drug use: No     Allergies   Shrimp [shellfish allergy]   Review of Systems Review of Systems  Constitutional: Negative for fever.  Respiratory: Negative for shortness of breath.   Cardiovascular: Positive for chest pain. Negative for leg swelling.  Gastrointestinal: Positive for nausea. Negative for abdominal distention, abdominal pain and constipation.  Genitourinary: Negative for difficulty urinating, dysuria, flank pain, frequency and urgency.  Musculoskeletal: Positive for back pain. Negative for gait problem and joint swelling.  Skin: Negative for rash.  Neurological: Negative for weakness and numbness.     Physical Exam Updated Vital Signs BP (!) 174/94 (BP Location: Right Arm)    Pulse 70   Temp 97.8 F (36.6 C) (Oral)   Resp 18   Ht 5\' 4"  (1.626 m)   Wt 81.6 kg (180 lb)   SpO2 100%   BMI 30.90 kg/m   Physical Exam  Constitutional: She appears well-developed and well-nourished.  HENT:  Head: Normocephalic and atraumatic.  Eyes: Conjunctivae are normal.  Neck: Normal range of motion.  Cardiovascular: Normal rate, regular rhythm, normal heart sounds and intact distal pulses.  Pulmonary/Chest: Breath sounds normal. She has no wheezes.  Poor effort secondary to localized left lower rib cage pain, reproducible with palpation. No hematoma, bruising or flail chest.   Abdominal: Soft. Bowel sounds are normal. There is tenderness in the left upper quadrant. There is CVA tenderness. There is no rigidity.  Guarding luq with deep palpation.  Musculoskeletal: Normal range of motion.  Neurological: She is alert.  Skin: Skin is warm and dry.  Psychiatric: She has a normal mood and affect.  Nursing note and vitals reviewed.    ED Treatments / Results  Labs (all labs ordered are listed, but only abnormal results are displayed) Labs Reviewed  URINALYSIS, DIPSTICK ONLY - Abnormal; Notable for the following components:      Result Value   Color, Urine STRAW (*)    All other components within normal limits  I-STAT CHEM 8, ED - Abnormal; Notable for the following components:   Chloride 100 (*)    Hemoglobin 16.0 (*)    HCT 47.0 (*)    All other components within normal limits    EKG  EKG Interpretation None       Radiology Dg Ribs Unilateral W/chest Left  Result Date: 08/17/2017 CLINICAL DATA:  Status post fall, with left anterior lower rib pain, radiating to the left axilla. Initial encounter. EXAM: LEFT RIBS AND CHEST - 3+ VIEW COMPARISON:  Chest radiograph performed 06/08/2014 FINDINGS: There is a minimally displaced fracture of the left lateral ninth rib. The lungs are well-aerated and clear. There is no evidence of focal opacification, pleural  effusion or pneumothorax. The cardiomediastinal silhouette is within normal limits. No additional osseous abnormalities are seen. IMPRESSION: 1. Minimally displaced fracture of the left lateral ninth rib. 2. No acute cardiopulmonary process seen. Electronically Signed   By: Roanna Raider M.D.   On: 08/17/2017 21:46   Dg Lumbar Spine Complete  Result Date: 08/17/2017 CLINICAL DATA:  Acute onset of lower back pain after fall. EXAM: LUMBAR SPINE - COMPLETE 4+ VIEW COMPARISON:  CT of the abdomen and pelvis performed 07/12/2016 FINDINGS: There is no evidence of fracture or subluxation. Vertebral bodies demonstrate normal height and alignment. Intervertebral disc spaces are preserved. The visualized neural foramina are grossly unremarkable in appearance. The visualized bowel  gas pattern is unremarkable in appearance; air and stool are noted within the colon. The sacroiliac joints are within normal limits. IMPRESSION: No evidence of fracture or subluxation along the lumbar spine. Electronically Signed   By: Roanna RaiderJeffery  Chang M.D.   On: 08/17/2017 21:44   Ct Abdomen Pelvis W Contrast  Result Date: 08/18/2017 CLINICAL DATA:  Blunt trauma, slid down icy ramp landing on back today. EXAM: CT ABDOMEN AND PELVIS WITH CONTRAST TECHNIQUE: Multidetector CT imaging of the abdomen and pelvis was performed using the standard protocol following bolus administration of intravenous contrast. CONTRAST:  100mL ISOVUE-300 IOPAMIDOL (ISOVUE-300) INJECTION 61% COMPARISON:  Lumbar spine radiographs earlier this day. FINDINGS: Lower chest: The lung bases are clear. Hepatobiliary: No hepatic injury or perihepatic hematoma. Elongated left lobe of the liver. Gallbladder is unremarkable Pancreas: No evidence of injury. No ductal dilatation or inflammation. Fatty atrophy of the pancreatic head. Spleen: No splenic injury or perisplenic hematoma. Adrenals/Urinary Tract: No adrenal hemorrhage or renal injury identified. Homogeneous renal  enhancement and symmetric excretion on delayed phase imaging. 9 mm hypodensity in the lateral left kidney is too small to characterize but likely cyst. Bladder is nondistended without wall thickening. Stomach/Bowel: No evidence of bowel injury or mesenteric hematoma. Incidental duodenum diverticulum. No bowel wall thickening or inflammation. High-density material may appendix is likely enteric contrast from remote prior CT. No appendiceal inflammation. Vascular/Lymphatic: No vascular injury. Abdominal aorta and IVC are intact. Minimal aortic atherosclerosis. No retroperitoneal fluid. No enlarged abdominal or pelvic lymph nodes. Reproductive: Uterus and bilateral adnexa are unremarkable. Other: No free air or free fluid. Musculoskeletal: No fracture of the lumbar spine or bony pelvis. Included ribs are intact. Degenerative change within the spine. IMPRESSION: No acute abnormality or acute traumatic injury to the abdomen or pelvis. Electronically Signed   By: Rubye OaksMelanie  Ehinger M.D.   On: 08/18/2017 00:14    Procedures Procedures (including critical care time)  Medications Ordered in ED Medications  ketorolac (TORADOL) 30 MG/ML injection 30 mg (not administered)  iopamidol (ISOVUE-300) 61 % injection 100 mL (100 mLs Intravenous Contrast Given 08/17/17 2322)     Initial Impression / Assessment and Plan / ED Course  I have reviewed the triage vital signs and the nursing notes.  Pertinent labs & imaging results that were available during my care of the patient were reviewed by me and considered in my medical decision making (see chart for details).     No intraabdominal trauma based on CT imaging tonight.  Single left sided rib fracture. Incentive spirometer, pain medication, f/u with pcp in 1 week.  Return precautions discussed including worsened cp, sob, cough, fever.   Final Clinical Impressions(s) / ED Diagnoses   Final diagnoses:  Closed fracture of one rib of left side, initial encounter    Fall, initial encounter    ED Discharge Orders        Ordered    HYDROcodone-acetaminophen (NORCO/VICODIN) 5-325 MG tablet  Every 4 hours PRN     08/18/17 0020    HYDROcodone-acetaminophen (NORCO/VICODIN) 5-325 MG tablet  Every 4 hours PRN     08/18/17 Montel Clock0020       Aisha Greenberger, PA-C 08/18/17 0025    Samuel JesterMcManus, Kathleen, DO 08/20/17 1916

## 2017-08-17 NOTE — ED Triage Notes (Signed)
Pt states she slid down an icy ramp and landed on her back today. Pt reports mid back and left rib. Pt denies any LOC

## 2017-08-18 MED ORDER — KETOROLAC TROMETHAMINE 30 MG/ML IJ SOLN
30.0000 mg | Freq: Once | INTRAMUSCULAR | Status: DC
Start: 2017-08-18 — End: 2017-08-18

## 2017-08-18 MED ORDER — HYDROCODONE-ACETAMINOPHEN 5-325 MG PO TABS: 1.0000 | ORAL_TABLET | ORAL | 0 refills | Status: DC | PRN

## 2017-08-18 NOTE — ED Notes (Signed)
Patient was given a prepackage of Hydrocodone/Acetaminophen quantity six and instructions on use, patient verbally understands.

## 2017-08-18 NOTE — ED Notes (Signed)
Patient given incentive spirometer and given teach back instructions on use.

## 2017-08-18 NOTE — Discharge Instructions (Signed)
Maintain normal activity as tolerated.  Remember to use your incentive spirometer to help prevent pneumonia which is a common complication of rib fractures.  You may take the hydrocodone prescribed for pain relief.  This will make you drowsy - do not drive within 4 hours of taking this medication.  Apply an ice pack for the next 2 days to your site of pain.  You may also try using a heating pad 20 minutes several times daily after danger of increased swelling at the site is gone (2 days).  Your CT scan is negative for any internal abdominal inury.

## 2017-08-20 MED FILL — Hydrocodone-Acetaminophen Tab 5-325 MG: ORAL | Qty: 6 | Status: AC

## 2017-08-25 DIAGNOSIS — S2232XD Fracture of one rib, left side, subsequent encounter for fracture with routine healing: Secondary | ICD-10-CM | POA: Diagnosis not present

## 2017-09-03 DIAGNOSIS — Z712 Person consulting for explanation of examination or test findings: Secondary | ICD-10-CM | POA: Diagnosis not present

## 2017-09-28 ENCOUNTER — Other Ambulatory Visit (HOSPITAL_COMMUNITY): Payer: Self-pay | Admitting: Internal Medicine

## 2017-09-28 ENCOUNTER — Ambulatory Visit (HOSPITAL_COMMUNITY)
Admission: RE | Admit: 2017-09-28 | Discharge: 2017-09-28 | Disposition: A | Discharge status: Home / Self Care | Payer: Worker's Compensation | Source: Ambulatory Visit | Attending: Internal Medicine | Admitting: Internal Medicine

## 2017-09-28 DIAGNOSIS — X58XXXA Exposure to other specified factors, initial encounter: Secondary | ICD-10-CM | POA: Diagnosis not present

## 2017-09-28 DIAGNOSIS — S2232XD Fracture of one rib, left side, subsequent encounter for fracture with routine healing: Secondary | ICD-10-CM | POA: Diagnosis not present

## 2017-09-28 DIAGNOSIS — Z712 Person consulting for explanation of examination or test findings: Secondary | ICD-10-CM | POA: Diagnosis not present

## 2017-09-28 DIAGNOSIS — R0782 Intercostal pain: Secondary | ICD-10-CM | POA: Diagnosis not present

## 2017-09-28 DIAGNOSIS — S2232XA Fracture of one rib, left side, initial encounter for closed fracture: Secondary | ICD-10-CM

## 2017-09-28 DIAGNOSIS — K219 Gastro-esophageal reflux disease without esophagitis: Secondary | ICD-10-CM | POA: Diagnosis not present

## 2017-09-28 DIAGNOSIS — S2239XA Fracture of one rib, unspecified side, initial encounter for closed fracture: Secondary | ICD-10-CM | POA: Diagnosis present

## 2017-11-08 DIAGNOSIS — H02533 Eyelid retraction right eye, unspecified eyelid: Secondary | ICD-10-CM | POA: Diagnosis not present

## 2017-11-08 DIAGNOSIS — K589 Irritable bowel syndrome without diarrhea: Secondary | ICD-10-CM | POA: Diagnosis not present

## 2017-11-08 DIAGNOSIS — K219 Gastro-esophageal reflux disease without esophagitis: Secondary | ICD-10-CM | POA: Diagnosis not present

## 2017-11-08 DIAGNOSIS — Z6831 Body mass index (BMI) 31.0-31.9, adult: Secondary | ICD-10-CM | POA: Diagnosis not present

## 2017-11-13 ENCOUNTER — Encounter: Payer: Self-pay | Admitting: Obstetrics and Gynecology

## 2017-11-13 ENCOUNTER — Ambulatory Visit: Payer: BLUE CROSS/BLUE SHIELD | Admitting: Obstetrics and Gynecology

## 2017-11-13 VITALS — BP 120/80 | HR 97 | Temp 98.0°F | Ht 64.2 in | Wt 186.2 lb

## 2017-11-13 DIAGNOSIS — R319 Hematuria, unspecified: Secondary | ICD-10-CM | POA: Diagnosis not present

## 2017-11-13 DIAGNOSIS — N3001 Acute cystitis with hematuria: Secondary | ICD-10-CM | POA: Diagnosis not present

## 2017-11-13 DIAGNOSIS — N390 Urinary tract infection, site not specified: Secondary | ICD-10-CM | POA: Insufficient documentation

## 2017-11-13 LAB — POCT URINALYSIS DIPSTICK
Blood, UA: 3
Glucose, UA: NEGATIVE
Ketones, UA: NEGATIVE
NITRITE UA: NEGATIVE

## 2017-11-13 MED ORDER — SULFAMETHOXAZOLE-TRIMETHOPRIM 400-80 MG PO TABS
1.0000 | ORAL_TABLET | Freq: Two times a day (BID) | ORAL | 0 refills | Status: DC
Start: 2017-11-13 — End: 2018-07-23

## 2017-11-13 NOTE — Addendum Note (Signed)
Addended by: Tilda BurrowFERGUSON, Cassidi Modesitt V on: 11/13/2017 10:36 AM   Modules accepted: Level of Service

## 2017-11-13 NOTE — Progress Notes (Addendum)
   [facility] Visit  @DATE @            Patient name: Victoria Page MRN [id number]  Date of birth: 01/08/1956  CC & HPI:  Victoria KlippelVictoria A Shimkus-Knowles is a 62 y.o. female presenting today for UTI sx. Frequency , blood in urine and LAP x 2 wk, using Azo but sx recurred. No recent uti's. No fever, CVAT  ROS:  ROS Hx IBS   Pertinent History Reviewed:   Reviewed: Significant for none Medical         Past Medical History:  Diagnosis Date  . Gallstones   . GERD (gastroesophageal reflux disease)   . Hypertension   . IBS (irritable bowel syndrome)   . LLQ pain 08/19/2014  . Plantar fasciitis                               Surgical Hx:    Past Surgical History:  Procedure Laterality Date  . COLONOSCOPY N/A 09/06/2016   Procedure: COLONOSCOPY;  Surgeon: Malissa HippoNajeeb U Rehman, MD;  Location: [facility];  Service: Endoscopy;  Laterality: N/A;  8:30  . FLEXIBLE SIGMOIDOSCOPY     Medications: Reviewed & Updated - see associated section                       Current Outpatient Medications:  .  calcium-vitamin D (OSCAL WITH D) 500-200 MG-UNIT tablet, Take 1 tablet by mouth daily., Disp: , Rfl:  .  hydrochlorothiazide (HYDRODIURIL) 25 MG tablet, Take 25 mg by mouth daily., Disp: , Rfl:  .  IBU 800 MG tablet, Take 800 mg by mouth daily as needed for mild pain or moderate pain. , Disp: , Rfl: 3 .  Omega-3 Fatty Acids (FISH OIL PO), Take 1 capsule by mouth daily., Disp: , Rfl:  .  ALPRAZolam (XANAX) 0.5 MG tablet, Take 0.5 mg by mouth daily as needed for anxiety. , Disp: , Rfl: 0 .  HYDROcodone-acetaminophen (NORCO/VICODIN) 5-325 MG tablet, Take 1 tablet by mouth every 4 (four) hours as needed. (Patient not taking: Reported on 11/13/2017), Disp: 6 tablet, Rfl: 0 .  HYDROcodone-acetaminophen (NORCO/VICODIN) 5-325 MG tablet, Take 1 tablet by mouth every 4 (four) hours as needed. (Patient not taking: Reported on 11/13/2017), Disp: 20 tablet, Rfl: 0   Social History: Reviewed -   reports that  has never smoked. she has never used smokeless tobacco.  Objective Findings:  Vitals: Blood pressure 120/80, pulse 97, temperature 98 F (36.7 C), height 5' 4.2" (1.631 m), weight 186 lb 3.2 oz (84.5 kg).  PHYSICAL EXAMINATION General appearance - alert, well appearing, and in no distress and oriented to person, place, and time Mental status - alert, oriented to person, place, and time, depressed mood Chest -  Heart -  Abdomen - soft, nontender, nondistended, no masses or organomegaly + suprapubic tenderness Breasts -  Skin -       Assessment & Plan:   A:  1.  UTI  P:  1.  Sx Septra

## 2017-11-15 ENCOUNTER — Telehealth: Payer: Self-pay | Admitting: *Deleted

## 2017-11-15 LAB — URINE CULTURE

## 2017-11-15 NOTE — Telephone Encounter (Signed)
Pt called and stated that Dr. Emelda FearFerguson is treating her for a uti, she was put on macrobid. She is not experiencing pain and burning between her legs. She denies itching or discharge. Discussed patient with Victorino DikeJennifer and she recommends that patient is seen by her tomorrow. Patient agrees to come in at 9:15 am.

## 2017-11-16 ENCOUNTER — Other Ambulatory Visit: Payer: Self-pay

## 2017-11-16 ENCOUNTER — Encounter: Payer: Self-pay | Admitting: Adult Health

## 2017-11-16 ENCOUNTER — Ambulatory Visit: Payer: BLUE CROSS/BLUE SHIELD | Admitting: Adult Health

## 2017-11-16 VITALS — BP 124/80 | HR 73 | Ht 64.5 in | Wt 186.0 lb

## 2017-11-16 DIAGNOSIS — N949 Unspecified condition associated with female genital organs and menstrual cycle: Secondary | ICD-10-CM

## 2017-11-16 DIAGNOSIS — R1032 Left lower quadrant pain: Secondary | ICD-10-CM

## 2017-11-16 DIAGNOSIS — N952 Postmenopausal atrophic vaginitis: Secondary | ICD-10-CM

## 2017-11-16 DIAGNOSIS — N9089 Other specified noninflammatory disorders of vulva and perineum: Secondary | ICD-10-CM

## 2017-11-16 MED ORDER — NYSTATIN-TRIAMCINOLONE 100000-0.1 UNIT/GM-% EX CREA: 1.0000 "application " | TOPICAL_CREAM | Freq: Two times a day (BID) | CUTANEOUS | 0 refills | Status: DC

## 2017-11-16 NOTE — Progress Notes (Signed)
Subjective:     Patient ID: Victoria Page A Bufano-Knowles, female   DOB: June 04, 1956, 62 y.o.   MRN: [id number]  HPI [name] is a 62 year old white female in complaining of vulva burning and pain left side for a while now.Was treated for UTI 11/13/17.  Review of Systems Burning on vulva better today LLQ pain for a while now Reviewed past medical,surgical, social and family history. Reviewed medications and allergies.     Objective:   Physical Exam BP 124/80 (BP Location: Right Arm, Patient Position: Sitting, Cuff Size: Normal)   Pulse 73   Ht 5' 4.5" (1.638 m)   Wt 186 lb (84.4 kg)   BMI 31.43 kg/m  Skin warm and dry.Pelvic: external genitalia is normal in appearance no lesions, vagina: side walls red, loss of rugae and moisture,urethra has no lesions or masses noted, cervix:smooth, uterus: normal size, shape and contour, + tender, no masses felt, adnexa: no masses, LLQ tenderness noted. Bladder is non tender and no masses felt.    Will rx mytrex for outside tissues and get US to assess LLQ pain. Assessment:     1. Vulvar irritation   2. Vaginal atrophy   3. LLQ pain   4. Uterine tenderness       Plan:     Meds ordered this encounter  Medications  . nystatin-triamcinolone (MYCOLOG II) cream    Sig: Apply 1 application topically 2 (two) times daily.    Dispense:  30 g    Refill:  0    Order Specific Question:   Supervising Provider    Answer:   Lazaro ArmsEURE, LUTHER H [[id number]]  Return in 1 week for GYN  US May add premarin vaginal cream after [place]

## 2017-11-22 ENCOUNTER — Ambulatory Visit (INDEPENDENT_AMBULATORY_CARE_PROVIDER_SITE_OTHER): Payer: BLUE CROSS/BLUE SHIELD

## 2017-11-22 DIAGNOSIS — N83201 Unspecified ovarian cyst, right side: Secondary | ICD-10-CM | POA: Diagnosis not present

## 2017-11-22 DIAGNOSIS — R1032 Left lower quadrant pain: Secondary | ICD-10-CM

## 2017-11-22 DIAGNOSIS — N949 Unspecified condition associated with female genital organs and menstrual cycle: Secondary | ICD-10-CM | POA: Diagnosis not present

## 2017-11-22 NOTE — Progress Notes (Signed)
PELVIC US TA/TV: homogeneous anteverted uterus,wnl,small amount of simple fluid within the endometrium,EEC 3.3 mm,normal left ovary, two simple right ovarian cysts (#1) 1.6 x 1.5 x 1.2 cm (#2) .9 x .8 x .7 cm,no free fluid,left adnexal discomfort,ovaries appear mobile

## 2017-11-23 ENCOUNTER — Telehealth: Payer: Self-pay | Admitting: Adult Health

## 2017-11-23 NOTE — Telephone Encounter (Signed)
Pt aware that US showed simple cyst on right will put in recall to recheck in 3 months

## 2017-12-06 DIAGNOSIS — I10 Essential (primary) hypertension: Secondary | ICD-10-CM | POA: Diagnosis not present

## 2017-12-06 DIAGNOSIS — E782 Mixed hyperlipidemia: Secondary | ICD-10-CM | POA: Diagnosis not present

## 2017-12-10 DIAGNOSIS — M19042 Primary osteoarthritis, left hand: Secondary | ICD-10-CM | POA: Diagnosis not present

## 2017-12-10 DIAGNOSIS — M19041 Primary osteoarthritis, right hand: Secondary | ICD-10-CM | POA: Diagnosis not present

## 2017-12-10 DIAGNOSIS — I10 Essential (primary) hypertension: Secondary | ICD-10-CM | POA: Diagnosis not present

## 2017-12-10 DIAGNOSIS — Z0000 Encounter for general adult medical examination without abnormal findings: Secondary | ICD-10-CM | POA: Diagnosis not present

## 2017-12-12 ENCOUNTER — Telehealth (HOSPITAL_COMMUNITY): Payer: Self-pay | Admitting: Physical Therapy

## 2017-12-12 NOTE — Telephone Encounter (Signed)
Called [employer] Work[employer]#[id number]#[id number]- L/m requested to get infromation on approval for PT visit Eval and Treat. NF 12/12/17

## 2017-12-17 DIAGNOSIS — M1711 Unilateral primary osteoarthritis, right knee: Secondary | ICD-10-CM | POA: Diagnosis not present

## 2017-12-17 DIAGNOSIS — M79672 Pain in left foot: Secondary | ICD-10-CM | POA: Diagnosis not present

## 2017-12-19 DIAGNOSIS — M1711 Unilateral primary osteoarthritis, right knee: Secondary | ICD-10-CM | POA: Diagnosis not present

## 2017-12-19 DIAGNOSIS — M25561 Pain in right knee: Secondary | ICD-10-CM | POA: Diagnosis not present

## 2017-12-19 DIAGNOSIS — M79672 Pain in left foot: Secondary | ICD-10-CM | POA: Diagnosis not present

## 2017-12-20 ENCOUNTER — Telehealth (HOSPITAL_COMMUNITY): Payer: Self-pay | Admitting: Physical Therapy

## 2017-12-20 DIAGNOSIS — M1711 Unilateral primary osteoarthritis, right knee: Secondary | ICD-10-CM | POA: Diagnosis not present

## 2017-12-20 NOTE — Telephone Encounter (Signed)
Returned pt phone call to let her know I have not received any inforamtion from her Worker's Comp- that I l/m a message to get information on 12/03/17.NF 12/20/17

## 2018-03-29 DIAGNOSIS — F430 Acute stress reaction: Secondary | ICD-10-CM | POA: Diagnosis not present

## 2018-03-29 DIAGNOSIS — F419 Anxiety disorder, unspecified: Secondary | ICD-10-CM | POA: Diagnosis not present

## 2018-04-09 ENCOUNTER — Telehealth: Payer: Self-pay | Admitting: *Deleted

## 2018-04-09 DIAGNOSIS — R339 Retention of urine, unspecified: Secondary | ICD-10-CM | POA: Diagnosis not present

## 2018-04-09 DIAGNOSIS — N10 Acute tubulo-interstitial nephritis: Secondary | ICD-10-CM | POA: Diagnosis not present

## 2018-04-09 DIAGNOSIS — R3915 Urgency of urination: Secondary | ICD-10-CM | POA: Diagnosis not present

## 2018-04-09 DIAGNOSIS — S2232XD Fracture of one rib, left side, subsequent encounter for fracture with routine healing: Secondary | ICD-10-CM | POA: Diagnosis not present

## 2018-04-09 DIAGNOSIS — I10 Essential (primary) hypertension: Secondary | ICD-10-CM | POA: Diagnosis not present

## 2018-04-09 DIAGNOSIS — Z0000 Encounter for general adult medical examination without abnormal findings: Secondary | ICD-10-CM | POA: Diagnosis not present

## 2018-04-09 DIAGNOSIS — R0782 Intercostal pain: Secondary | ICD-10-CM | POA: Diagnosis not present

## 2018-04-09 DIAGNOSIS — R109 Unspecified abdominal pain: Secondary | ICD-10-CM | POA: Diagnosis not present

## 2018-04-09 NOTE — Telephone Encounter (Signed)
Pt called stating that she is experiencing vaginal irritation, pain and pressure, the same as she had in March. She has no c/o discharge or odor. She states it has been going on for 3 days. Advised that she would need to be evaluated by a provider. Pt was offered an appt but declined when she was told she couldn't be seen today. She states that she would see her PCP.

## 2018-07-10 DIAGNOSIS — M545 Low back pain: Secondary | ICD-10-CM | POA: Diagnosis not present

## 2018-07-10 DIAGNOSIS — R0782 Intercostal pain: Secondary | ICD-10-CM | POA: Diagnosis not present

## 2018-07-10 DIAGNOSIS — G8929 Other chronic pain: Secondary | ICD-10-CM | POA: Diagnosis not present

## 2018-07-10 DIAGNOSIS — E782 Mixed hyperlipidemia: Secondary | ICD-10-CM | POA: Diagnosis not present

## 2018-07-10 DIAGNOSIS — M79652 Pain in left thigh: Secondary | ICD-10-CM | POA: Diagnosis not present

## 2018-07-10 DIAGNOSIS — I10 Essential (primary) hypertension: Secondary | ICD-10-CM | POA: Diagnosis not present

## 2018-07-16 DIAGNOSIS — K219 Gastro-esophageal reflux disease without esophagitis: Secondary | ICD-10-CM | POA: Diagnosis not present

## 2018-07-16 DIAGNOSIS — I10 Essential (primary) hypertension: Secondary | ICD-10-CM | POA: Diagnosis not present

## 2018-07-23 ENCOUNTER — Encounter (INDEPENDENT_AMBULATORY_CARE_PROVIDER_SITE_OTHER): Payer: Self-pay | Admitting: Internal Medicine

## 2018-07-23 ENCOUNTER — Ambulatory Visit (INDEPENDENT_AMBULATORY_CARE_PROVIDER_SITE_OTHER): Payer: BLUE CROSS/BLUE SHIELD | Admitting: Internal Medicine

## 2018-07-23 VITALS — BP 150/100 | HR 60 | Temp 97.7°F | Ht 64.5 in | Wt 186.6 lb

## 2018-07-23 DIAGNOSIS — K588 Other irritable bowel syndrome: Secondary | ICD-10-CM

## 2018-07-23 DIAGNOSIS — K219 Gastro-esophageal reflux disease without esophagitis: Secondary | ICD-10-CM

## 2018-07-23 MED ORDER — DICYCLOMINE HCL 10 MG PO CAPS: 10.0000 mg | ORAL_CAPSULE | Freq: Three times a day (TID) | ORAL | 0 refills | Status: DC

## 2018-07-23 MED ORDER — OMEPRAZOLE 40 MG PO CPDR: 40.0000 mg | DELAYED_RELEASE_CAPSULE | Freq: Every day | ORAL | 3 refills | Status: DC

## 2018-07-23 NOTE — Patient Instructions (Signed)
PR in 4 weeks.

## 2018-07-23 NOTE — Progress Notes (Signed)
   Subjective:    Patient ID: Victoria Page, female    DOB: 1955/10/27, 62 y.o.   MRN: [id number]  HPI Here today for f/u. Her last colonoscopy was in January of 2018 (screening).   Normal colon. External hemorrhoids.  She says today she has IBS. She has pain with her BMs. Pain is relieved her BM. She has belching and burping. She is not on a PPI. Has the abdominal pain every 3-4 days with her BMs. Her appetite is good. No weight loss. She has a BM x 3 a day. Stools are nice and soft. She does have urgency.  No melena or BRRB  Review of Systems Past Medical History:  Diagnosis Date  . Gallstones   . GERD (gastroesophageal reflux disease)   . Hypertension   . IBS (irritable bowel syndrome)   . LLQ pain 08/19/2014  . Plantar fasciitis     Past Surgical History:  Procedure Laterality Date  . COLONOSCOPY N/A 09/06/2016   Procedure: COLONOSCOPY;  Surgeon: Malissa HippoNajeeb U Rehman, MD;  Location: [facility];  Service: Endoscopy;  Laterality: N/A;  8:30  . FLEXIBLE SIGMOIDOSCOPY      Allergies  Allergen Reactions  . Shrimp [Shellfish Allergy] Nausea And Vomiting    Current Outpatient Medications on File Prior to Visit  Medication Sig Dispense Refill  . ALPRAZolam (XANAX) 0.5 MG tablet Take 0.5 mg by mouth daily as needed for anxiety.   0  . calcium-vitamin D (OSCAL WITH D) 500-200 MG-UNIT tablet Take 1 tablet by mouth daily.    . hydrochlorothiazide (HYDRODIURIL) 25 MG tablet Take 25 mg by mouth daily.    . IBU 800 MG tablet Take 800 mg by mouth daily as needed for mild pain or moderate pain.   3  . Omega-3 Fatty Acids (FISH OIL PO) Take 1 capsule by mouth daily.     No current facility-administered medications on file prior to visit.         Objective:   Physical Exam Blood pressure (!) 150/100, pulse 60, temperature 97.7 F (36.5 C), height 5' 4.5" (1.638 m), weight 186 lb 9.6 oz (84.6 kg). Alert and oriented. Skin warm and dry. Oral mucosa is moist.   . Sclera  anicteric, conjunctivae is pink. Thyroid not enlarged. No cervical lymphadenopathy. Lungs clear. Heart regular rate and rhythm.  Abdomen is soft. Bowel sounds are positive. No hepatomegaly. No abdominal masses felt. No tenderness.  No edema to lower extremities.          Assessment & Plan:  IBS. Rx for Dicyclomine 10mg  BID. GERD: Rx for Omeprazole 40mg   PR in a couple of weeks

## 2018-08-02 DIAGNOSIS — J069 Acute upper respiratory infection, unspecified: Secondary | ICD-10-CM | POA: Diagnosis not present

## 2018-08-02 DIAGNOSIS — J329 Chronic sinusitis, unspecified: Secondary | ICD-10-CM | POA: Diagnosis not present

## 2018-08-07 ENCOUNTER — Other Ambulatory Visit: Payer: Self-pay | Admitting: Internal Medicine

## 2018-08-07 DIAGNOSIS — Z1231 Encounter for screening mammogram for malignant neoplasm of breast: Secondary | ICD-10-CM

## 2018-08-12 ENCOUNTER — Encounter (HOSPITAL_COMMUNITY): Payer: Self-pay

## 2018-08-12 ENCOUNTER — Ambulatory Visit (HOSPITAL_COMMUNITY)
Admission: RE | Admit: 2018-08-12 | Discharge: 2018-08-12 | Disposition: A | Discharge status: Home / Self Care | Payer: BLUE CROSS/BLUE SHIELD | Source: Ambulatory Visit | Attending: Internal Medicine | Admitting: Internal Medicine

## 2018-08-12 DIAGNOSIS — Z1231 Encounter for screening mammogram for malignant neoplasm of breast: Secondary | ICD-10-CM | POA: Diagnosis not present

## 2018-08-12 LAB — HM MAMMOGRAPHY

## 2018-08-13 ENCOUNTER — Other Ambulatory Visit: Payer: Self-pay

## 2018-08-13 ENCOUNTER — Encounter: Payer: Self-pay | Admitting: Adult Health

## 2018-08-13 ENCOUNTER — Ambulatory Visit: Payer: BLUE CROSS/BLUE SHIELD | Admitting: Adult Health

## 2018-08-13 VITALS — BP 147/88 | HR 68 | Resp 18 | Ht 64.5 in | Wt 189.0 lb

## 2018-08-13 DIAGNOSIS — Z1212 Encounter for screening for malignant neoplasm of rectum: Secondary | ICD-10-CM

## 2018-08-13 DIAGNOSIS — Z0000 Encounter for general adult medical examination without abnormal findings: Secondary | ICD-10-CM | POA: Insufficient documentation

## 2018-08-13 DIAGNOSIS — Z01419 Encounter for gynecological examination (general) (routine) without abnormal findings: Secondary | ICD-10-CM

## 2018-08-13 DIAGNOSIS — Z1211 Encounter for screening for malignant neoplasm of colon: Secondary | ICD-10-CM | POA: Diagnosis not present

## 2018-08-13 DIAGNOSIS — Z8742 Personal history of other diseases of the female genital tract: Secondary | ICD-10-CM | POA: Insufficient documentation

## 2018-08-13 LAB — HEMOCCULT GUIAC POC 1CARD (OFFICE): FECAL OCCULT BLD: NEGATIVE

## 2018-08-13 NOTE — Progress Notes (Addendum)
Patient ID: Victoria Page, female   DOB: November 09, 1955, 62 y.o.   MRN: [id number] History of Present Illness: [name] is a 62 year old white female,married, PM, in for well woman gyn exam,sh had normal pap in April 2018 at PCP. PCP is Dr Margo AyeHall.    Current Medications, Allergies, Past Medical History, Past Surgical History, Family History and Social History were reviewed in [name] electronic medical record.     Review of Systems: Patient denies any headaches, hearing loss, fatigue, blurred vision, shortness of breath, chest pain, abdominal pain, problems with bowel movements(has IBS), urination, or intercourse(not active). No joint pain or mood swings. Had right ovarian cyst on US in March, needs F/U US for stability.   Physical Exam:BP (!) 147/88 (BP Location: Right Arm, Patient Position: Sitting, Cuff Size: Normal)   Pulse 68   Resp 18   Ht 5' 4.5" (1.638 m)   Wt 189 lb (85.7 kg)   BMI 31.94 kg/m  General:  Well developed, well nourished, no acute distress Skin:  Warm and dry Neck:  Midline trachea, normal thyroid, good ROM, no lymphadenopathy,no carotid bruits heard Lungs; Clear to auscultation bilaterally Breast:  No dominant palpable mass, retraction, or nipple discharge Cardiovascular: Regular rate and rhythm Abdomen:  Soft, non tender, no hepatosplenomegaly Pelvic:  External genitalia is normal in appearance, no lesions.  The vagina is normal in appearance for age with loss of color, moisture and rugae. Urethra has no lesions or masses. The cervix is smooth.  Uterus is felt to be normal size, shape, and contour.  No adnexal masses or tenderness noted.Bladder is non tender, no masses felt. Rectal: Good sphincter tone, no polyps, or hemorrhoids felt.  Hemoccult negative. Extremities/musculoskeletal:  No swelling or varicosities noted, no clubbing or cyanosis Psych:  No mood changes, alert and cooperative,seems happy PHQ 2 score 0. Fall risk is moderate Had verbal  consent to perform exam without chaperone.  Will  schedule F/U US for next week.  Impression: 1. Encounter for well woman exam with routine gynecological exam   2. Screening for colorectal cancer   3. History of ovarian cyst       Plan: Return in 1 week for GYN US to assess hx right ovarian cyst  Physical in  1 year Had mammogram yesterday was negative for malignancy   Pap in 2021 Labs with PCP Colonoscopy per GI

## 2018-08-17 DIAGNOSIS — J3089 Other allergic rhinitis: Secondary | ICD-10-CM | POA: Diagnosis not present

## 2018-08-17 DIAGNOSIS — Z6831 Body mass index (BMI) 31.0-31.9, adult: Secondary | ICD-10-CM | POA: Diagnosis not present

## 2018-08-17 DIAGNOSIS — I10 Essential (primary) hypertension: Secondary | ICD-10-CM | POA: Diagnosis not present

## 2018-08-17 DIAGNOSIS — E669 Obesity, unspecified: Secondary | ICD-10-CM | POA: Diagnosis not present

## 2018-08-19 ENCOUNTER — Ambulatory Visit: Payer: BLUE CROSS/BLUE SHIELD

## 2018-08-19 DIAGNOSIS — Z8742 Personal history of other diseases of the female genital tract: Secondary | ICD-10-CM | POA: Diagnosis not present

## 2018-08-19 NOTE — Progress Notes (Signed)
PELVIC US TA/TV: homogeneous anteverted uterus,wnl,small amount of simple fluid within the endometrium,EEC 3.7 mm,normal left ovary,two small simple cysts right ovary,no significant change from previous ultrasound, (#2) 1.7 x 1.4 x 1.7 cm,(#2) .9 x .9 x 1 cm,no free fluid,ovaries appear mobile,some left adnexal discomfort during ultrasound

## 2018-08-20 ENCOUNTER — Other Ambulatory Visit: Payer: BLUE CROSS/BLUE SHIELD | Admitting: Obstetrics & Gynecology

## 2018-08-21 ENCOUNTER — Other Ambulatory Visit: Payer: BLUE CROSS/BLUE SHIELD | Admitting: Advanced Practice Midwife

## 2018-08-22 ENCOUNTER — Telehealth: Payer: Self-pay | Admitting: Adult Health

## 2018-08-22 NOTE — Telephone Encounter (Signed)
Pt aware of US showed simple cysts

## 2018-09-02 ENCOUNTER — Other Ambulatory Visit (INDEPENDENT_AMBULATORY_CARE_PROVIDER_SITE_OTHER): Payer: Self-pay | Admitting: Internal Medicine

## 2018-09-02 DIAGNOSIS — K588 Other irritable bowel syndrome: Secondary | ICD-10-CM

## 2018-11-12 ENCOUNTER — Ambulatory Visit: Payer: PRIVATE HEALTH INSURANCE | Admitting: Family Medicine

## 2018-11-12 ENCOUNTER — Encounter: Payer: Self-pay | Admitting: Family Medicine

## 2018-11-12 ENCOUNTER — Other Ambulatory Visit: Payer: Self-pay

## 2018-11-12 VITALS — BP 130/77 | HR 75 | Ht 64.75 in | Wt 174.0 lb

## 2018-11-12 DIAGNOSIS — R300 Dysuria: Secondary | ICD-10-CM | POA: Diagnosis not present

## 2018-11-12 DIAGNOSIS — E7800 Pure hypercholesterolemia, unspecified: Secondary | ICD-10-CM | POA: Diagnosis not present

## 2018-11-12 DIAGNOSIS — M545 Low back pain, unspecified: Secondary | ICD-10-CM

## 2018-11-12 LAB — POCT URINALYSIS DIPSTICK OB
Glucose, UA: NEGATIVE
Ketones, UA: NEGATIVE
LEUKOCYTES UA: NEGATIVE
NITRITE UA: NEGATIVE
PROTEIN: NEGATIVE
RBC UA: NEGATIVE

## 2018-11-12 MED ORDER — SULFAMETHOXAZOLE-TRIMETHOPRIM 800-160 MG PO TABS: 1.0000 | ORAL_TABLET | Freq: Two times a day (BID) | ORAL | 0 refills | Status: AC

## 2018-11-12 NOTE — Progress Notes (Signed)
    GYNECOLOGY PROBLEM  VISIT ENCOUNTER NOTE  Subjective:   Victoria Page is a 63 y.o. [phone] female here for a a problem GYN visit.    Chief Complaint  Patient presents with  . burning with urination    lower back pain     Started 3 days ago. Reports urine was cloudy. Currently has lower abdominal pain that wraps around to her back. Took AZO today which reports helped her symptoms. She reports this feels like past kidney infections.   Denies abnormal vaginal bleeding, discharge, pelvic pain, problems with intercourse or other gynecologic concerns.    Gynecologic History No LMP recorded. Patient is postmenopausal. Contraception: post menopausal status  Health Maintenance Due  Topic Date Due  . Hepatitis C Screening  1956-02-18  . HIV Screening  01/21/1972  . TETANUS/TDAP  01/21/1976  . INFLUENZA VACCINE  04/04/2018     The following portions of the patient's history were reviewed and updated as appropriate: allergies, current medications, past family history, past medical history, past social history, past surgical history and problem list.  Review of Systems Pertinent items are noted in HPI.   Objective:  BP 130/77 (BP Location: Right Arm, Patient Position: Sitting, Cuff Size: Normal)   Pulse 75   Ht 5' 4.75" (1.645 m)   Wt 174 lb (78.9 kg)   BMI 29.18 kg/m  Gen: well appearing, NAD HEENT: no scleral icterus CV: RR Lung: Normal WOB Ext: warm well perfused   Assessment and Plan:  1. Burning with urination - POC Urinalysis Dipstick OB - Urine Culture - sulfamethoxazole-trimethoprim (BACTRIM DS,SEPTRA DS) 800-160 MG tablet; Take 1 tablet by mouth 2 (two) times daily for 7 days.  Dispense: 14 tablet; Refill: 0  2. Low back pain, unspecified back pain laterality, unspecified chronicity, unspecified whether sciatica present - POC Urinalysis Dipstick OB - Urine Culture - sulfamethoxazole-trimethoprim (BACTRIM DS,SEPTRA DS) 800-160 MG tablet; Take 1  tablet by mouth 2 (two) times daily for 7 days.  Dispense: 14 tablet; Refill: 0  3. Hypercholesteremia - Lipid panel  Please refer to After Visit Summary for other counseling recommendations.     Federico Flake, MD, MPH, ABFM Attending Physician [facility]

## 2018-11-14 LAB — URINE CULTURE: ORGANISM ID, BACTERIA: NO GROWTH

## 2018-11-20 LAB — LIPID PANEL
Chol/HDL Ratio: 3.7 ratio (ref 0.0–4.4)
Cholesterol, Total: 201 mg/dL — ABNORMAL HIGH (ref 100–199)
HDL: 54 mg/dL (ref >39)
LDL CALC: 106 mg/dL — AB (ref 0–99)
Triglycerides: 206 mg/dL — ABNORMAL HIGH (ref 0–149)
VLDL CHOLESTEROL CAL: 41 mg/dL — AB (ref 5–40)

## 2018-11-22 ENCOUNTER — Telehealth: Payer: Self-pay | Admitting: Obstetrics and Gynecology

## 2018-11-22 NOTE — Telephone Encounter (Signed)
Pt would like a call with her lab results.

## 2018-11-22 NOTE — Telephone Encounter (Signed)
Pt informed of lab results. No other questions at this time.

## 2018-11-29 ENCOUNTER — Other Ambulatory Visit: Payer: Self-pay | Admitting: Family Medicine

## 2018-12-02 ENCOUNTER — Other Ambulatory Visit: Payer: Self-pay | Admitting: Family Medicine

## 2018-12-03 ENCOUNTER — Other Ambulatory Visit: Payer: Self-pay | Admitting: Family Medicine

## 2019-01-02 ENCOUNTER — Telehealth: Payer: Self-pay | Admitting: Obstetrics and Gynecology

## 2019-01-02 NOTE — Telephone Encounter (Signed)
Pt is having green vaginal discharge and low back pain. Requesting an appt.

## 2019-01-03 ENCOUNTER — Other Ambulatory Visit: Payer: Self-pay

## 2019-01-03 ENCOUNTER — Other Ambulatory Visit: Payer: PRIVATE HEALTH INSURANCE

## 2019-01-03 DIAGNOSIS — R82998 Other abnormal findings in urine: Secondary | ICD-10-CM

## 2019-01-03 DIAGNOSIS — N898 Other specified noninflammatory disorders of vagina: Secondary | ICD-10-CM

## 2019-01-03 NOTE — Progress Notes (Signed)
Pt in for self swab and urine culture.

## 2019-01-04 LAB — URINALYSIS
Bilirubin, UA: NEGATIVE
Glucose, UA: NEGATIVE
Ketones, UA: NEGATIVE
Leukocytes,UA: NEGATIVE
Nitrite, UA: NEGATIVE
Protein,UA: NEGATIVE
RBC, UA: NEGATIVE
Specific Gravity, UA: 1.006 (ref 1.005–1.030)
Urobilinogen, Ur: 0.2 mg/dL (ref 0.2–1.0)
pH, UA: 7.5 (ref 5.0–7.5)

## 2019-01-05 LAB — URINE CULTURE

## 2019-01-06 ENCOUNTER — Telehealth: Payer: Self-pay | Admitting: Obstetrics and Gynecology

## 2019-01-06 MED ORDER — METRONIDAZOLE 500 MG PO TABS: 500.0000 mg | ORAL_TABLET | Freq: Three times a day (TID) | ORAL | 0 refills | Status: AC

## 2019-01-06 NOTE — Addendum Note (Signed)
Addended by: Sherre Lain A on: 01/06/2019 01:32 PM   Modules accepted: Orders

## 2019-01-06 NOTE — Telephone Encounter (Signed)
Discussed patients symptoms of green vaginal discharge and pelvic pain with Dr. Alysia Penna. He gave verbal order to start patient on Flagyl 500 mg bid x 7 days. Patient agreeable advised no alcohol and to take med with food. No further questions at this time.

## 2019-01-06 NOTE — Telephone Encounter (Signed)
Called patient back and informed her that nuswab was not back but urine test is negative.

## 2019-01-06 NOTE — Telephone Encounter (Signed)
Pt is calling to get results of some test done on Friday

## 2019-01-07 ENCOUNTER — Telehealth: Payer: Self-pay | Admitting: Obstetrics and Gynecology

## 2019-01-07 NOTE — Telephone Encounter (Signed)
Pt aware NuSwab can take 1 week for results. Pt aware of urine results and to push fluids. Pt voiced understanding. JSY

## 2019-01-07 NOTE — Telephone Encounter (Signed)
Calling to get results of a swab she had done last week

## 2019-01-09 ENCOUNTER — Ambulatory Visit (INDEPENDENT_AMBULATORY_CARE_PROVIDER_SITE_OTHER): Payer: PRIVATE HEALTH INSURANCE | Admitting: Internal Medicine

## 2019-01-09 ENCOUNTER — Other Ambulatory Visit: Payer: Self-pay

## 2019-01-09 ENCOUNTER — Encounter (INDEPENDENT_AMBULATORY_CARE_PROVIDER_SITE_OTHER): Payer: Self-pay | Admitting: Internal Medicine

## 2019-01-09 VITALS — BP 143/97 | HR 77 | Temp 98.1°F | Ht 65.0 in | Wt 171.3 lb

## 2019-01-09 DIAGNOSIS — K219 Gastro-esophageal reflux disease without esophagitis: Secondary | ICD-10-CM | POA: Diagnosis not present

## 2019-01-09 DIAGNOSIS — R1030 Lower abdominal pain, unspecified: Secondary | ICD-10-CM

## 2019-01-09 MED ORDER — OMEPRAZOLE 40 MG PO CPDR: 40.0000 mg | DELAYED_RELEASE_CAPSULE | Freq: Every day | ORAL | 3 refills | Status: DC

## 2019-01-09 NOTE — Patient Instructions (Addendum)
Will get an [place] abodmen. She will continue the Dicyclomine Rx for Omeprazole sent to her pharmacy

## 2019-01-09 NOTE — Progress Notes (Signed)
   Subjective:    Patient ID: Victoria Page, female    DOB: [birth year], 63 y.o.   MRN: [id number]  HPI Here today for f/u. Last seen in November of 2019 for IBS.    Her last colonoscopy was in January of 2018 (screening).   Normal colon. External hemorrhoids.  She says today she has IBS. She has pain with her BMs. She tells me she has back pain in her lower back.  She has some epigastric pain.  She also has vaginal pain She has a heavy crampy feeling in her lower abdomen. She says the pain is worse when she has a BM.  She has a metallic taste to her mouth since starting the Flagyl. Recent urine was negative.  She has weight loss which was intentional Her appetite is good.  The pain is located in her back. She denies any injury. Pain for about  1/1/2 weeks.     Review of Systems Past Medical History:  Diagnosis Date  . Gallstones   . GERD (gastroesophageal reflux disease)   . Hypertension   . IBS (irritable bowel syndrome)   . LLQ pain 08/19/2014  . Plantar fasciitis     Past Surgical History:  Procedure Laterality Date  . COLONOSCOPY N/A 09/06/2016   Procedure: COLONOSCOPY;  Surgeon: Malissa Hippo, MD;  Location: [facility];  Service: Endoscopy;  Laterality: N/A;  8:30  . FLEXIBLE SIGMOIDOSCOPY      Allergies  Allergen Reactions  . Shrimp [Shellfish Allergy] Nausea And Vomiting    Current Outpatient Medications on File Prior to Visit  Medication Sig Dispense Refill  . ALPRAZolam (XANAX) 0.5 MG tablet Take 0.5 mg by mouth daily as needed for anxiety.   0  . calcium-vitamin D (OSCAL WITH D) 500-200 MG-UNIT tablet Take 1 tablet by mouth daily.    . hydrochlorothiazide (HYDRODIURIL) 25 MG tablet Take 25 mg by mouth daily.    . metroNIDAZOLE (FLAGYL) 500 MG tablet Take 1 tablet (500 mg total) by mouth 3 (three) times daily for 7 days. 21 tablet 0  . Omega-3 Fatty Acids (FISH OIL PO) Take 1 capsule by mouth daily.     No current facility-administered medications  on file prior to visit.         Objective:   Physical Exam Blood pressure (!) 143/97, pulse 77, temperature 98.1 F (36.7 C), height 5\' 5"  (1.651 m), weight 171 lb 4.8 oz (77.7 kg). Alert and oriented. Skin warm and dry. Oral mucosa is moist.   . Sclera anicteric, conjunctivae is pink. Thyroid not enlarged. No cervical lymphadenopathy. Lungs clear. Heart regular rate and rhythm.  Abdomen is soft. Bowel sounds are positive. No hepatomegaly. No abdominal masses felt. No tenderness.  No edema to lower extremities.             Assessment & Plan:  Lower abdominal pain. Will get an US abdomen. She will start the Dicyclomine.  GERD: Will start her on Omeprazole 40mg  daily.

## 2019-01-13 ENCOUNTER — Telehealth: Payer: Self-pay | Admitting: Obstetrics & Gynecology

## 2019-01-13 LAB — NUSWAB VAGINITIS PLUS (VG+)
Candida albicans, NAA: NEGATIVE
Candida glabrata, NAA: NEGATIVE
Chlamydia trachomatis, NAA: NEGATIVE
Neisseria gonorrhoeae, NAA: NEGATIVE
Trich vag by NAA: NEGATIVE

## 2019-01-13 NOTE — Telephone Encounter (Signed)
Patient called stating that she would like to know the results of her blood work.  °Please contact pt °

## 2019-01-15 ENCOUNTER — Other Ambulatory Visit: Payer: Self-pay

## 2019-01-15 ENCOUNTER — Ambulatory Visit (HOSPITAL_COMMUNITY)
Admission: RE | Admit: 2019-01-15 | Discharge: 2019-01-15 | Disposition: A | Discharge status: Home / Self Care | Payer: PRIVATE HEALTH INSURANCE | Source: Ambulatory Visit | Attending: Internal Medicine | Admitting: Internal Medicine

## 2019-01-15 DIAGNOSIS — R1030 Lower abdominal pain, unspecified: Secondary | ICD-10-CM | POA: Insufficient documentation

## 2019-01-20 ENCOUNTER — Telehealth: Payer: Self-pay | Admitting: Obstetrics and Gynecology

## 2019-01-20 ENCOUNTER — Telehealth (INDEPENDENT_AMBULATORY_CARE_PROVIDER_SITE_OTHER): Payer: Self-pay | Admitting: Internal Medicine

## 2019-01-20 DIAGNOSIS — K8020 Calculus of gallbladder without cholecystitis without obstruction: Secondary | ICD-10-CM

## 2019-01-20 DIAGNOSIS — R1010 Upper abdominal pain, unspecified: Secondary | ICD-10-CM

## 2019-01-20 NOTE — Telephone Encounter (Signed)
Victoria Page, HIDA scan 

## 2019-01-20 NOTE — Telephone Encounter (Signed)
Patient called, looking for ultrasound results.  [phone]

## 2019-01-20 NOTE — Telephone Encounter (Signed)
Pt aware she called the wrong office. Victoria Page

## 2019-01-21 NOTE — Telephone Encounter (Signed)
HIDA scan sch'd 01/24/19 at 10 (945), left detailed message for patient

## 2019-01-24 ENCOUNTER — Encounter (HOSPITAL_COMMUNITY): Payer: Self-pay

## 2019-01-24 ENCOUNTER — Ambulatory Visit (HOSPITAL_COMMUNITY)
Admission: RE | Admit: 2019-01-24 | Discharge: 2019-01-24 | Disposition: A | Discharge status: Home / Self Care | Payer: PRIVATE HEALTH INSURANCE | Source: Ambulatory Visit | Attending: Internal Medicine | Admitting: Internal Medicine

## 2019-01-24 ENCOUNTER — Other Ambulatory Visit: Payer: Self-pay

## 2019-01-24 DIAGNOSIS — R1010 Upper abdominal pain, unspecified: Secondary | ICD-10-CM | POA: Diagnosis not present

## 2019-01-24 DIAGNOSIS — K8020 Calculus of gallbladder without cholecystitis without obstruction: Secondary | ICD-10-CM | POA: Insufficient documentation

## 2019-01-24 MED ORDER — TECHNETIUM TC 99M MEBROFENIN IV KIT
5.0000 | PACK | Freq: Once | INTRAVENOUS | Status: AC | PRN
  Administered 2019-01-24: 10:00:00 5 via INTRAVENOUS

## 2019-01-29 ENCOUNTER — Telehealth (INDEPENDENT_AMBULATORY_CARE_PROVIDER_SITE_OTHER): Payer: Self-pay | Admitting: Internal Medicine

## 2019-01-29 DIAGNOSIS — K8020 Calculus of gallbladder without cholecystitis without obstruction: Secondary | ICD-10-CM

## 2019-01-29 DIAGNOSIS — R1011 Right upper quadrant pain: Secondary | ICD-10-CM

## 2019-01-29 NOTE — Telephone Encounter (Signed)
Will refer to Dr. Lovell Sheehan to see if he thinks it is her GB. If not, will proceed with an EGD.

## 2019-02-11 ENCOUNTER — Other Ambulatory Visit: Payer: Self-pay

## 2019-02-11 ENCOUNTER — Ambulatory Visit (INDEPENDENT_AMBULATORY_CARE_PROVIDER_SITE_OTHER): Payer: PRIVATE HEALTH INSURANCE | Admitting: General Surgery

## 2019-02-11 ENCOUNTER — Encounter: Payer: Self-pay | Admitting: General Surgery

## 2019-02-11 VITALS — BP 132/91 | HR 69 | Temp 98.0°F | Resp 16 | Ht 64.75 in | Wt 171.0 lb

## 2019-02-11 DIAGNOSIS — K8020 Calculus of gallbladder without cholecystitis without obstruction: Secondary | ICD-10-CM | POA: Diagnosis not present

## 2019-02-11 NOTE — Progress Notes (Signed)
Victoria Page; [id number]; [birth year]   HPI Patient is a 63 year old white female who was referred to my care by Deberah Castle for evaluation treatment of biliary colic.  Patient states that she has had known history of cholelithiasis for many years.  She also suffers from IBS.  She has had intermittent bloating and upper abdominal pain for some time now.  She states that approximately 1 month ago, she had a 2 to 3-week history of upper abdominal pain with bloating and nausea.  She does states she has intermittent episodes of fatty food intolerance.  She states the pain radiates across the top of her abdomen.  She is currently mostly concerned about pain in the left upper quadrant.  She denies fever, chills, or jaundice.  She did have a HIDA scan which showed a normal gallbladder ejection fraction.  She did experience some GI discomfort with the Ensure.  She states her symptoms have currently subsided except for the left upper quadrant abdominal pain.  She does control what she eats and avoids those foods that make her feel bad. Past Medical History:  Diagnosis Date  . Gallstones   . GERD (gastroesophageal reflux disease)   . Hypertension   . IBS (irritable bowel syndrome)   . LLQ pain 08/19/2014  . Plantar fasciitis     Past Surgical History:  Procedure Laterality Date  . COLONOSCOPY N/A 09/06/2016   Procedure: COLONOSCOPY;  Surgeon: Rogene Houston, MD;  Location: [facility];  Service: Endoscopy;  Laterality: N/A;  8:30  . FLEXIBLE SIGMOIDOSCOPY      Family History  Problem Relation Age of Onset  . Lung cancer Mother   . Heart attack Father   . Diverticulitis Paternal Grandmother   . Diverticulitis Brother        died from septic shock  . Breast cancer Daughter 52  . COPD Brother   . Colon cancer Neg Hx     Current Outpatient Medications on File Prior to Visit  Medication Sig Dispense Refill  . ALPRAZolam (XANAX) 0.5 MG tablet Take 0.5 mg by mouth daily as needed for  anxiety.   0  . calcium-vitamin D (OSCAL WITH D) 500-200 MG-UNIT tablet Take 1 tablet by mouth daily.    Marland Kitchen dicyclomine (BENTYL) 10 MG capsule Take 10 mg by mouth 4 (four) times daily as needed for spasms.    . hydrochlorothiazide (HYDRODIURIL) 25 MG tablet Take 25 mg by mouth daily.    . Omega-3 Fatty Acids (FISH OIL PO) Take 1 capsule by mouth daily.    Marland Kitchen omeprazole (PRILOSEC) 40 MG capsule Take 1 capsule (40 mg total) by mouth daily. 90 capsule 3   No current facility-administered medications on file prior to visit.     Allergies  Allergen Reactions  . Shrimp [Shellfish Allergy] Nausea And Vomiting    Social History   Substance and Sexual Activity  Alcohol Use Yes   Comment: "Once every 2 months"    Social History   Tobacco Use  Smoking Status Never Smoker  Smokeless Tobacco Never Used    Review of Systems  Constitutional: Positive for malaise/fatigue.  HENT: Positive for sinus pain.   Eyes: Positive for blurred vision.  Respiratory: Negative.   Cardiovascular: Negative.   Gastrointestinal: Positive for abdominal pain, heartburn and nausea.  Genitourinary: Negative.   Musculoskeletal: Positive for back pain, joint pain and neck pain.  Skin: Negative.   Neurological: Negative.   Endo/Heme/Allergies: Negative.   Psychiatric/Behavioral: Negative.  Objective   Vitals:   02/11/19 1336  BP: (!) 132/91  Pulse: 69  Resp: 16  Temp: 98 F (36.7 C)  SpO2: 97%    Physical Exam Vitals signs reviewed.  Constitutional:      Appearance: Normal appearance. She is not ill-appearing.  HENT:     Head: Normocephalic and atraumatic.  Eyes:     General: No scleral icterus. Cardiovascular:     Rate and Rhythm: Normal rate and regular rhythm.     Heart sounds: Normal heart sounds. No murmur. No friction rub. No gallop.   Pulmonary:     Effort: Pulmonary effort is normal. No respiratory distress.     Breath sounds: Normal breath sounds. No stridor. No wheezing, rhonchi  or rales.  Abdominal:     General: Abdomen is flat. Bowel sounds are normal. There is no distension.     Palpations: Abdomen is soft. There is no mass.     Tenderness: There is no abdominal tenderness. There is no guarding or rebound.     Hernia: No hernia is present.  Skin:    General: Skin is warm and dry.  Neurological:     Mental Status: She is alert and oriented to person, place, and time.   GI notes reviewed  Assessment  Cholelithiasis, currently asymptomatic Plan   I told the patient that she would most likely benefit from cholecystectomy as she does have intermittent episodes of biliary colic secondary to the gallstone.  Is difficult to a certain how frequently that is occurring.  As she feels okay right now and has have been gallstone for many years, she would like to defer surgical intervention at this time.  That is fine with me.  The signs and symptoms of biliary colic were discussed with the patient and literature was given.  She will call me should she have an increase in intensity or frequency of those symptoms.

## 2019-02-11 NOTE — Patient Instructions (Signed)
Biliary Colic, Adult  Biliary colic is severe pain caused by a problem with a small organ in the upper right part of your belly (gallbladder). The gallbladder stores a digestive fluid produced in the liver (bile) that helps the body break down fat. Bile and other digestive enzymes are carried from the liver to the small intestine through tube-like structures (bile ducts). The gallbladder and the bile ducts form the biliary tract. Sometimes hard deposits of digestive fluids form in the gallbladder (gallstones) and block the flow of bile from the gallbladder, causing biliary colic. This condition is also called a gallbladder attack. Gallstones can be as small as a grain of sand or as big as a golf ball. There could be just one gallstone in the gallbladder, or there could be many. What are the causes? Biliary colic is usually caused by gallstones. Less often, a tumor could block the flow of bile from the gallbladder and trigger biliary colic. What increases the risk? This condition is more likely to develop in:  Women.  People of Hispanic descent.  People with a family history of gallstones.  People who are obese.  People who suddenly or quickly lose weight.  People who eat a high-calorie, low-fiber diet that is rich in refined carbs (carbohydrates), such as white bread and white rice.  People who have an intestinal disease that affects nutrient absorption, such as Crohn disease.  People who have a metabolic condition, such as metabolic syndrome or diabetes. What are the signs or symptoms? Severe pain in the upper right side of the belly is the main symptom of biliary colic. You may feel this pain below the chest but above the hip. This pain often occurs at night or after eating a very fatty meal. This pain may get worse for up to an hour and last as long as 12 hours. In most cases, the pain fades (subsides) within a couple hours. Other symptoms of this condition include:  Nausea and  vomiting.  Pain under the right shoulder. How is this diagnosed? This condition is diagnosed based on your medical history, your symptoms, and a physical exam. You may have tests, including:  Blood tests to rule out infection or inflammation of the bile ducts, gallbladder, pancreas, or liver.  Imaging studies such as: ? Ultrasound. ? CT scan. ? MRI. In some cases, you may need to have an imaging study done using a small amount of radioactive material (nuclear medicine) to confirm the diagnosis. How is this treated? Treatment for this condition may include medicine to relieve your pain or nausea. If you have gallstones that are causing biliary colic, you may need surgery to remove the gallbladder (cholecystectomy). Gallstones can also be dissolved gradually with medicine. It may take months or years before the gallstones are completely gone. Follow these instructions at home:  Take over-the-counter and prescription medicines only as told by your health care provider.  Drink enough fluid to keep your urine clear or pale yellow.  Follow instructions from your health care provider about eating or drinking restrictions. These may include avoiding: ? Fatty, greasy, and fried foods. ? Any foods that make the pain worse. ? Overeating. ? Having a large meal after not eating for a while.  Keep all follow-up visits as told by your health care provider. This is important. How is this prevented? Steps to prevent this condition include:  Maintaining a healthy body weight.  Getting regular exercise.  Eating a healthy, high-fiber, low-fat diet.  Limiting how much   sugar and refined carbs you eat, such as sweets, white flour, and white rice. Contact a health care provider if:  Your pain lasts more than 5 hours.  You vomit.  You have a fever and chills.  Your pain gets worse. Get help right away if:  Your skin or the whites of your eyes look yellow (jaundice).  Your have tea-colored  urine and light-colored stools.  You are dizzy or you faint. Summary  Biliary colic is severe pain caused by a problem with a small organ in the upper right part of your belly (gallbladder).  Treatments for this condition include medicines that relieves your pain or nausea and medicines that slowly dissolves the gallstones.  If gallstones cause your biliary colic, the treatment is surgery to remove the gallbladder (cholecystectomy). This information is not intended to replace advice given to you by your health care provider. Make sure you discuss any questions you have with your health care provider. Document Released: 01/22/2006 Document Revised: 02/26/2017 Document Reviewed: 03/06/2016 Elsevier Interactive Patient Education  2019 Elsevier Inc.  

## 2019-04-05 IMAGING — DX DG RIBS 2V*L*
2 series · 2 of 2 positions shown · non-contrast
Comparison: 08/17/2017

CLINICAL DATA: Fell 6 weeks ago, anterior pain under LEFT breast

EXAM:
LEFT RIBS - 2 VIEW

[rib pa]
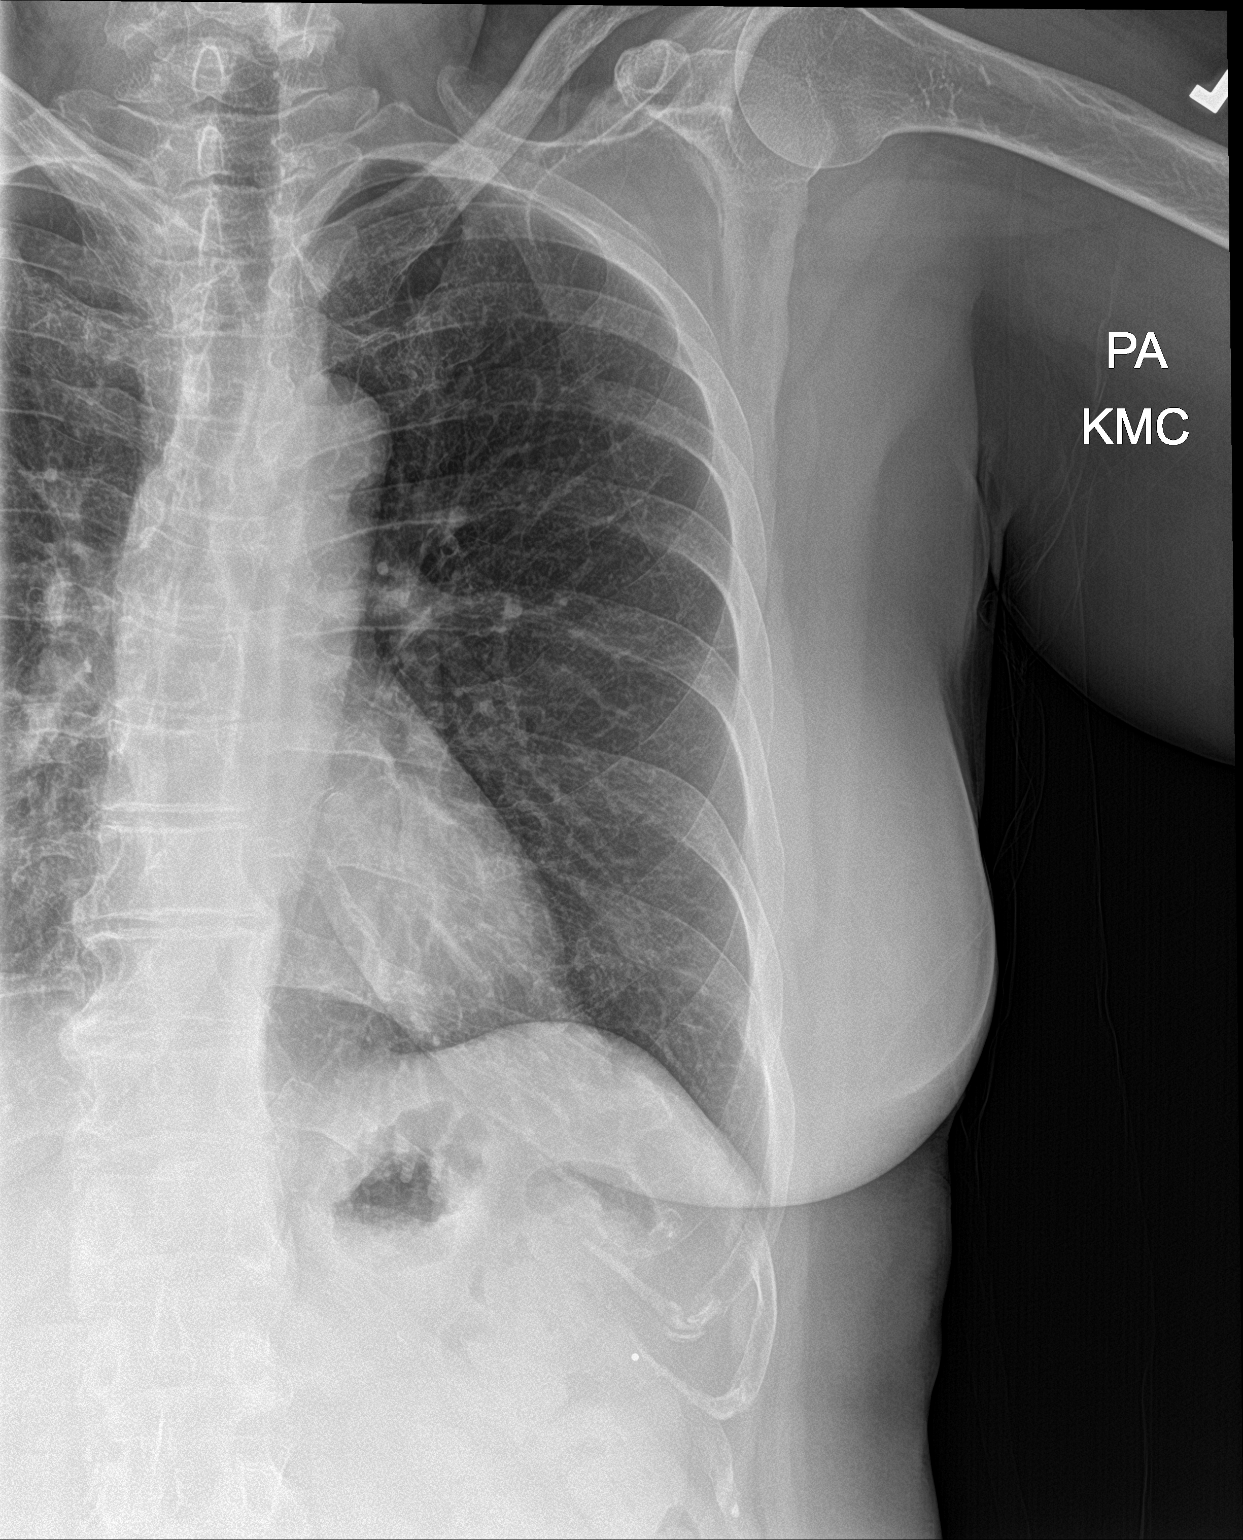

[rib pa obl]
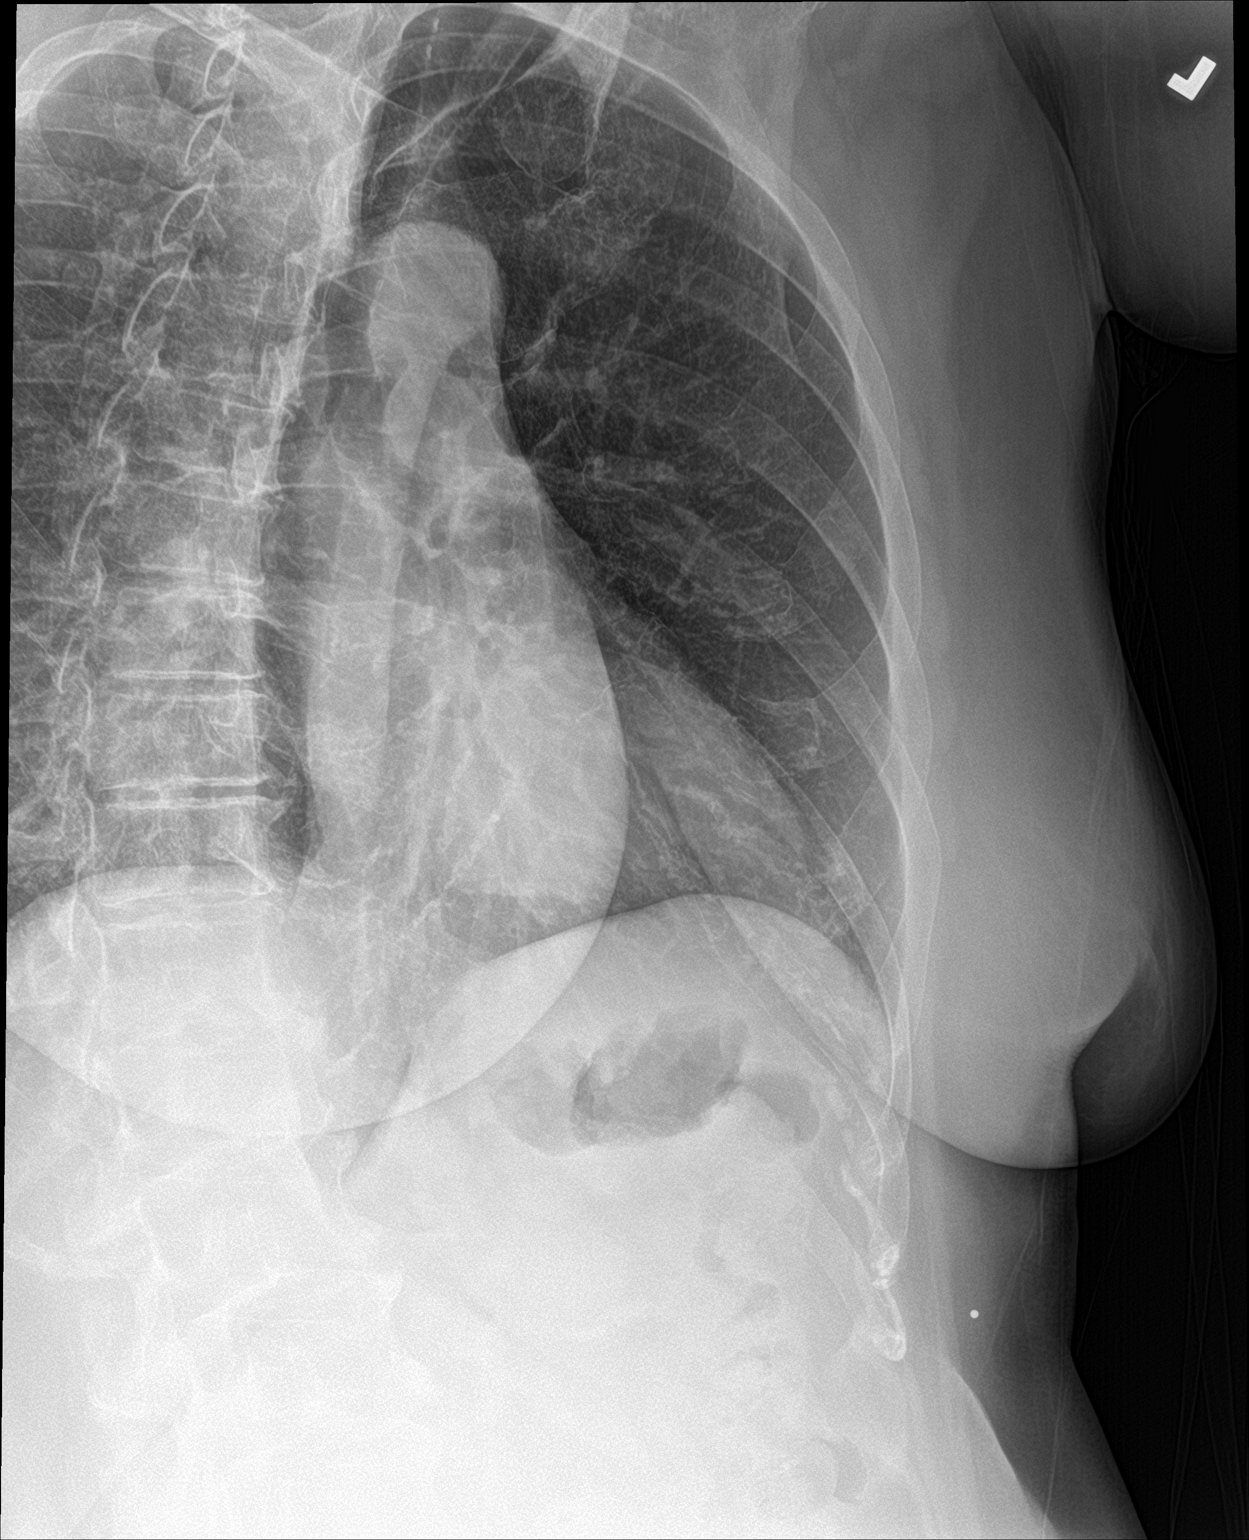

[2 of 2 positions shown; findings below may reference images not displayed]

FINDINGS: Diffuse osseous demineralization.

BB placed at site of symptoms lower anterior LEFT chest, overlying
anterior costal cartilage.

Mildly displaced fracture of the lateral LEFT ninth rib identified
at same site seen on previous exam.

No additional focal osseous abnormalities identified.
IMPRESSION: Mildly displaced fracture of lateral LEFT ninth rib.

## 2019-05-05 ENCOUNTER — Other Ambulatory Visit: Payer: Self-pay

## 2019-05-05 ENCOUNTER — Telehealth: Payer: Self-pay | Admitting: Women's Health

## 2019-05-05 ENCOUNTER — Other Ambulatory Visit: Payer: PRIVATE HEALTH INSURANCE

## 2019-05-05 DIAGNOSIS — R350 Frequency of micturition: Secondary | ICD-10-CM

## 2019-05-05 DIAGNOSIS — R102 Pelvic and perineal pain: Secondary | ICD-10-CM

## 2019-05-05 NOTE — Telephone Encounter (Signed)
Pt having symptoms of Possible UTI/ lower stomach pain, Urgency to urinate but very little will come out.  PLease advise if she can just come and drop of urine or if we need to work in

## 2019-05-07 ENCOUNTER — Telehealth: Payer: Self-pay | Admitting: *Deleted

## 2019-05-07 ENCOUNTER — Other Ambulatory Visit: Payer: Self-pay | Admitting: Women's Health

## 2019-05-07 MED ORDER — SULFAMETHOXAZOLE-TRIMETHOPRIM 800-160 MG PO TABS: 1.0000 | ORAL_TABLET | Freq: Two times a day (BID) | ORAL | 0 refills | Status: DC

## 2019-05-07 NOTE — Telephone Encounter (Signed)
Patient called for urine culture results.  Looks like it is positive.  Can you send script please?

## 2019-05-07 NOTE — Telephone Encounter (Signed)
LMOVM that urine culture was positive so antibiotics were sent to pharmacy.  Advised to take all of the medication even if her symptoms are gone.

## 2019-05-09 LAB — URINE CULTURE

## 2019-07-09 ENCOUNTER — Other Ambulatory Visit: Payer: Self-pay | Admitting: *Deleted

## 2019-07-09 DIAGNOSIS — Z20822 Contact with and (suspected) exposure to covid-19: Secondary | ICD-10-CM

## 2019-07-10 LAB — NOVEL CORONAVIRUS, NAA: SARS-CoV-2, NAA: NOT DETECTED

## 2019-07-25 ENCOUNTER — Other Ambulatory Visit: Payer: Self-pay

## 2019-07-25 DIAGNOSIS — Z20822 Contact with and (suspected) exposure to covid-19: Secondary | ICD-10-CM

## 2019-07-28 ENCOUNTER — Other Ambulatory Visit: Payer: Self-pay

## 2019-07-28 ENCOUNTER — Encounter: Payer: Self-pay | Admitting: Family Medicine

## 2019-07-28 ENCOUNTER — Ambulatory Visit (INDEPENDENT_AMBULATORY_CARE_PROVIDER_SITE_OTHER): Payer: PRIVATE HEALTH INSURANCE | Admitting: Family Medicine

## 2019-07-28 VITALS — BP 117/80 | HR 82 | Temp 98.9°F | Resp 15 | Ht 64.75 in | Wt 173.2 lb

## 2019-07-28 DIAGNOSIS — F419 Anxiety disorder, unspecified: Secondary | ICD-10-CM

## 2019-07-28 DIAGNOSIS — E781 Pure hyperglyceridemia: Secondary | ICD-10-CM

## 2019-07-28 DIAGNOSIS — K219 Gastro-esophageal reflux disease without esophagitis: Secondary | ICD-10-CM | POA: Insufficient documentation

## 2019-07-28 DIAGNOSIS — J3489 Other specified disorders of nose and nasal sinuses: Secondary | ICD-10-CM

## 2019-07-28 DIAGNOSIS — I10 Essential (primary) hypertension: Secondary | ICD-10-CM | POA: Diagnosis not present

## 2019-07-28 LAB — NOVEL CORONAVIRUS, NAA: SARS-CoV-2, NAA: NOT DETECTED

## 2019-07-28 MED ORDER — AMOXICILLIN 875 MG PO TABS: 875.0000 mg | ORAL_TABLET | Freq: Two times a day (BID) | ORAL | 0 refills | Status: DC

## 2019-07-28 MED ORDER — FAMOTIDINE 20 MG PO TABS: 20.0000 mg | ORAL_TABLET | Freq: Two times a day (BID) | ORAL | 5 refills | Status: DC

## 2019-07-28 NOTE — Patient Instructions (Addendum)
TAKE OMEPRAZOLE + PEPCID-if no improvement call GI for additional evaluation  Gastroesophageal Reflux Disease, Adult Gastroesophageal reflux (GER) happens when acid from the stomach flows up into the tube that connects the mouth and the stomach (esophagus). Normally, food travels down the esophagus and stays in the stomach to be digested. With GER, food and stomach acid sometimes move back up into the esophagus. You may have a disease called gastroesophageal reflux disease (GERD) if the reflux:  Happens often.  Causes frequent or very bad symptoms.  Causes problems such as damage to the esophagus. When this happens, the esophagus becomes sore and swollen (inflamed). Over time, GERD can make small holes (ulcers) in the lining of the esophagus. What are the causes? This condition is caused by a problem with the muscle between the esophagus and the stomach. When this muscle is weak or not normal, it does not close properly to keep food and acid from coming back up from the stomach. The muscle can be weak because of:  Tobacco use.  Pregnancy.  Having a certain type of hernia (hiatal hernia).  Alcohol use.  Certain foods and drinks, such as coffee, chocolate, onions, and peppermint. What increases the risk? You are more likely to develop this condition if you:  Are overweight.  Have a disease that affects your connective tissue.  Use NSAID medicines. What are the signs or symptoms? Symptoms of this condition include:  Heartburn.  Difficult or painful swallowing.  The feeling of having a lump in the throat.  A bitter taste in the mouth.  Bad breath.  Having a lot of saliva.  Having an upset or bloated stomach.  Belching.  Chest pain. Different conditions can cause chest pain. Make sure you see your doctor if you have chest pain.  Shortness of breath or noisy breathing (wheezing).  Ongoing (chronic) cough or a cough at night.  Wearing away of the surface of teeth  (tooth enamel).  Weight loss. How is this treated? Treatment will depend on how bad your symptoms are. Your doctor may suggest:  Changes to your diet.  Medicine.  Surgery. Follow these instructions at home: Eating and drinking   Follow a diet as told by your doctor. You may need to avoid foods and drinks such as: ? Coffee and tea (with or without caffeine). ? Drinks that contain alcohol. ? Energy drinks and sports drinks. ? Bubbly (carbonated) drinks or sodas. ? Chocolate and cocoa. ? Peppermint and mint flavorings. ? Garlic and onions. ? Horseradish. ? Spicy and acidic foods. These include peppers, chili powder, curry powder, vinegar, hot sauces, and BBQ sauce. ? Citrus fruit juices and citrus fruits, such as oranges, lemons, and limes. ? Tomato-based foods. These include red sauce, chili, salsa, and pizza with red sauce. ? Fried and fatty foods. These include donuts, french fries, potato chips, and high-fat dressings. ? High-fat meats. These include hot dogs, rib eye steak, sausage, ham, and bacon. ? High-fat dairy items, such as whole milk, butter, and cream cheese.  Eat small meals often. Avoid eating large meals.  Avoid drinking large amounts of liquid with your meals.  Avoid eating meals during the 2-3 hours before bedtime.  Avoid lying down right after you eat.  Do not exercise right after you eat. Lifestyle   Do not use any products that contain nicotine or tobacco. These include cigarettes, e-cigarettes, and chewing tobacco. If you need help quitting, ask your doctor.  Try to lower your stress. If you need help doing  this, ask your doctor.  If you are overweight, lose an amount of weight that is healthy for you. Ask your doctor about a safe weight loss goal. General instructions  Pay attention to any changes in your symptoms.  Take over-the-counter and prescription medicines only as told by your doctor. Do not take aspirin, ibuprofen, or other NSAIDs  unless your doctor says it is okay.  Wear loose clothes. Do not wear anything tight around your waist.  Raise (elevate) the head of your bed about 6 inches (15 cm).  Avoid bending over if this makes your symptoms worse.  Keep all follow-up visits as told by your doctor. This is important. Contact a doctor if:  You have new symptoms.  You lose weight and you do not know why.  You have trouble swallowing or it hurts to swallow.  You have wheezing or a cough that keeps happening.  Your symptoms do not get better with treatment.  You have a hoarse voice. Get help right away if:  You have pain in your arms, neck, jaw, teeth, or back.  You feel sweaty, dizzy, or light-headed.  You have chest pain or shortness of breath.  You throw up (vomit) and your throw-up looks like blood or coffee grounds.  You pass out (faint).  Your poop (stool) is bloody or black.  You cannot swallow, drink, or eat. Summary  If a person has gastroesophageal reflux disease (GERD), food and stomach acid move back up into the esophagus and cause symptoms or problems such as damage to the esophagus.  Treatment will depend on how bad your symptoms are.  Follow a diet as told by your doctor.  Take all medicines only as told by your doctor. This information is not intended to replace advice given to you by your health care provider. Make sure you discuss any questions you have with your health care provider. Document Released: 02/07/2008 Document Revised: 02/27/2018 Document Reviewed: 02/27/2018 Elsevier Patient Education  2020 Elsevier Inc.   mucinex 600mg  BID 12 hours Amoxil 850mg  BID -rx Vit D 1-2000IU /day

## 2019-07-28 NOTE — Progress Notes (Signed)
New Patient Office Visit  Subjective:  Patient ID: Victoria Page, female    DOB: 01-Dec-1955  Age: 63 y.o. MRN: [id number] Prescriptions Total Prescriptions: 26   Total Private Pay: 3   Fill Date ID   Written Drug Qty Days Prescriber Rx # Pharmacy Refill   Daily Dose* Pymt Type PMP    07/07/2019  2   07/01/2019  Alprazolam 1 MG Tablet  45.00  23 Jo Hal   [id number]   [place] [phone])   0  3.91 LME  Comm Ins   [place]  05/29/2019  2   05/29/2019  Alprazolam 1 MG Tablet  45.00  23 Jo Hal   [id number]   [place] [phone])   0  3.91 LME  Comm Ins   [place]  04/25/2019  2   04/25/2019  Alprazolam 1 MG Tablet  45.00  23 Jo Hal   [id number]   [place] [phone])   0  3.91 LME  Comm Ins   [place]  04/07/2019  2   04/07/2019  Alprazolam 1 MG Tablet  45.00  23 Jo Hal   [id number]   [place] [phone])   0  3.91 LME  Comm Ins   [place]  03/03/2019  2   03/03/2019  Alprazolam 1 MG Tablet  45.00  30 Jo Hal   [id number]   [place] [phone])   0  3.00 LME  Comm Ins   [place]  11/28/2018  2   11/19/2018  Alprazolam 1 MG Tablet  45.00  23 Jo Hal   [id number]   [place] [phone])   0  3.91 LME  Comm Ins   [place]  11/05/2018  2   11/05/2018  Alprazolam 1 MG Tablet  45.00  23 Jo Hal   [id number]   [place] (0197)   0  3.91 LME  Comm Ins   [place]  10/11/2018  1   07/16/2018  Alprazolam 1 MG Tablet  45.00  23 Darrick Meigs   [id number]   [place] [phone])   2  3.91 LME  Private Pay   [place]  09/02/2018  1   07/16/2018  Alprazolam 1 MG Tablet  45.00  23 Darrick Meigs   [id number]   [place] [phone])   1  3.91 LME  Comm Ins   [place]  08/07/2018  1   07/16/2018  Alprazolam 1 MG Tablet  45.00  23 Jo Hal   [id number]   [place] [phone])   0  3.91 LME  Comm Ins   [place]  07/08/2018  1   07/08/2018  Alprazolam 1 MG Tablet  45.00  23 Darrick Meigs   [id number]   [place] [phone])   0  3.91 LME  Comm Ins   [place]  06/07/2018  1   06/07/2018  Alprazolam 1 MG Tablet  45.00  23 Jo Hal   [id number]   [place] [phone])   0  3.91 LME  Comm Ins   [place]  05/09/2018  1   05/09/2018  Alprazolam 1 MG Tablet  45.00  23 Darrick Meigs   [id number]   [place] (0197)   0  3.91 LME  Private Pay     04/09/2018  1    04/09/2018  Alprazolam 1 MG Tablet  45.00  23 Jo Hal   [id number]   [place] [phone])   0  3.91 LME  Comm Ins     03/11/2018  1   02/06/2018  Alprazolam 1 MG Tablet  45.00  23 Jo Hal  [id number]   [place] [phone])   0  3.91 LME  [place]   [place]  02/05/2018  1   12/11/2017  Alprazolam 1 MG Tablet  45.00  23 Pat Kocher   [id number]   [place] [phone])   2  3.91 LME  Comm Ins   [place]  01/15/2018  1   01/07/2018  Tramadol Hcl 50 MG Tablet  42.00  7 Do Mck   [id number]   [place] (0197)   0  30.00 MME  Comm Ins   [place]  01/10/2018  1   12/11/2017  Alprazolam 1 MG Tablet  45.00  23 Jo Hal   [id number]   [place] [phone])   1  3.91 LME  Comm Ins   [place]  12/11/2017  1   12/11/2017  Alprazolam 1 MG Tablet  45.00  23 Jo Hal   [id number]   [place] [phone])   0  3.91 LME  Comm Ins   [place]  10/29/2017  1   10/29/2017  Alprazolam 0.5 MG Tablet  45.00  23 Jo Hal   [id number]   [place] [phone])   0  1.96 LME  Comm Ins   [place]  10/11/2017  1   10/11/2017  Carisoprodol 350 MG Tablet  40.00  14 Jo Hal   [id number]   [place] (0197)   0  0.80 LME  Comm Ins   [place]  09/25/2017  1   09/24/2017  Alprazolam 0.5 MG Tablet  45.00  23 Jo Hal   [id number]   [place] [phone])   0  1.96 LME  Comm Ins   [place]  09/04/2017  1   09/03/2017  Oxycodone-Acetaminophen 5-325  60.00  30 Jo Hal   [id number]   [place] (0174)   0  15.00 MME  Private Pay   [place]  08/25/2017  1   08/25/2017  Oxycodone-Acetaminophen 5-325  30.00  10 Ta And   [id number]   [place] (0197)   0  22.50 MME  Comm Ins   [place]  08/20/2017  1   08/18/2017  Hydrocodone-Acetamin 5-325 MG  20.00  4 Ju Ido   [id number]   [place] (0197)   0  25.00 MME  Comm Ins   [place]  08/18/2017  1   08/18/2017  Alprazolam 0.5 MG Tablet  45.00  22 Jo Hal   [id number]   [place] (0197)   0  2.05 LME  Comm Ins   [place]   09/04/2017  1   09/03/2017  Oxycodone-Acetaminophen 5-325  60.00  30 Jo Hal   [id number]   [place] (9233)   0  15.00 MME  Private Pay   [place]  08/25/2017  1   08/25/2017  Oxycodone-Acetaminophen 5-325  30.00  10 Ta And   [id number]   [place] (0197)   0  22.50 MME  Comm Ins   [place]  08/20/2017  1   08/18/2017   Hydrocodone-Acetamin 5-325 MG  20.00  4 Deliah Goody   [id number]   [place] (0197)   0  25.00 MME  Comm Ins   [place]  08/18/2017  1   08/18/2017  Alprazolam 0.5 MG Tablet  45.00  22 Jo Hal   [id number]   [place] (0197)   0  2.05 LME  Comm Ins   [place]  CC:  Chief Complaint  Patient presents with  . New Patient (Initial Visit)    establish care  HTN/GERD-chronic concerns  HPI Victoria Page presents for HTN-HCTZ daily, weight loss has helped control blood pressure-no CP/no SOB, no cardiac work up-stable GERD-omeprazole-40mg  daily, Tums and pepto tried for acute on chronic symptoms over the last few weeks, IBS-bentyl-given by GI-colonoscopy normal 2018-surgery recommended watch and wait before completing removal of gallbladder. No prior use of pepcid. Pt with no EGD , no ulcer history.    Burning in throat for a few days-clearing the throat-no mucous, facial pressure and ear pain.  Allergy trigger in the fall-zyrtec daily  Flonase, nasal rinses. No denies fever, SOB, cough  Past Medical History:  Diagnosis Date  . Gallstones   . GERD (gastroesophageal reflux disease)   . Hypertension   . IBS (irritable bowel syndrome)   . LLQ pain 08/19/2014  . Plantar fasciitis     Past Surgical History:  Procedure Laterality Date  . COLONOSCOPY N/A 09/06/2016   Procedure: COLONOSCOPY;  Surgeon: Malissa HippoNajeeb U Rehman, MD;  Location: [facility];  Service: Endoscopy;  Laterality: N/A;  8:30  . FLEXIBLE SIGMOIDOSCOPY      Family History  Problem Relation Age of Onset  . Lung cancer Mother   . Heart attack Father   . Diverticulitis Paternal Grandmother   . Diverticulitis Brother        died from septic shock  . Breast cancer Daughter [phone]  . COPD Brother   . Colon cancer Neg Hx     Social History   Socioeconomic History  . Marital status: Married    Spouse name: Not on file  . Number of children: 4  . Years of education: Not on file  . Highest education  level: Not on file  Occupational History  . Occupation: Lawyerubstitute teacher  . Occupation: Museum/gallery conservatorwaitress  Social Needs  . Financial resource strain: Not on file  . Food insecurity    Worry: Not on file    Inability: Not on file  . Transportation needs    Medical: Not on file    Non-medical: Not on file  Tobacco Use  . Smoking status: Never Smoker  . Smokeless tobacco: Never Used  Substance and Sexual Activity  . Alcohol use: Yes    Comment: "Once every 2 months"  . Drug use: No  . Sexual activity: Yes    Birth control/protection: Post-menopausal  Lifestyle  . Physical activity    Days per week: Not on file    Minutes per session: Not on file  . Stress: Not on file  Relationships  . Social Musicianconnections    Talks on phone: Not on file    Gets together: Not on file    Attends religious service: Not on file    Active member of club or organization: Not on file    Attends meetings of clubs or organizations: Not on file    Relationship status: Not on file  . Intimate partner violence    Fear of current or ex partner: Not on file    Emotionally abused: Not on file    Physically abused: Not on file    Forced sexual activity: Not on file  Other Topics Concern  . Not on file  Social History Narrative  . Not on file    ROS Review of Systems  Constitutional: Negative.   HENT: Positive for sinus pressure, sinus pain and sore throat. Negative for ear pain.  Eyes: Negative.   Respiratory: Positive for cough. Negative for wheezing.   Cardiovascular: Negative.   Gastrointestinal: Positive for abdominal distention.       GERD IBS  Endocrine: Negative.   Genitourinary: Negative.   Skin: Negative.   Allergic/Immunologic: Positive for environmental allergies.  Neurological: Positive for dizziness.  Hematological: Negative.   Psychiatric/Behavioral: The patient is nervous/anxious.        Pt stated prn use of xanax for anxiety    Objective:   Today's Vitals: BP 117/80   Pulse 82    Temp 98.9 F (37.2 C) (Oral)   Resp 15   Ht 5' 4.75" (1.645 m)   Wt 173 lb 3.2 oz (78.6 kg)   SpO2 96%   BMI 29.04 kg/m   Physical Exam Constitutional:      Appearance: Normal appearance.  HENT:     Head: Normocephalic and atraumatic.     Right Ear: Tympanic membrane, ear canal and external ear normal.     Left Ear: Tympanic membrane, ear canal and external ear normal.     Nose: Nose normal. No congestion.     Mouth/Throat:     Mouth: Mucous membranes are moist.  Eyes:     Conjunctiva/sclera: Conjunctivae normal.  Neck:     Musculoskeletal: Normal range of motion and neck supple.  Cardiovascular:     Rate and Rhythm: Normal rate and regular rhythm.     Heart sounds: Normal heart sounds.  Pulmonary:     Effort: Pulmonary effort is normal.     Breath sounds: Normal breath sounds.  Neurological:     Mental Status: She is alert and oriented to person, place, and time.  Psychiatric:        Mood and Affect: Mood normal.        Behavior: Behavior normal.     Assessment & Plan:  1. Gastroesophageal reflux disease without esophagitis Add pepcid + omeprazole-gastro if no improvement for possible EGD vs surgery for gallstone removal-symptoms have not improved with medication-avoid spicy foods, caffeine-decaf coffee-pt education discussed - COMPLETE METABOLIC PANEL WITH GFR - CBC with Differential  2. Essential hypertension HCTZ-pt needs labwork-last renal function 3/20-currently bp stable - COMPLETE METABOLIC PANEL WITH GFR - Lipid panel ecg 10/15 U/a normal 2020 3. Anxiety D/w pt can not rx xanax-feel pt more dependent on medication based fill rate and increase to  in 4/19 with 45 pills  every 4-6 weeks. Pt understands this medication can not be filled here and abruptly stopping medication may cause withdrawal due to amount of medication taken and length of time medication used.  Pt does not desire referral to behavioral health.  4. Hypertriglyceridemia-omega 3 1-3  grams daily-take with food-repeat lipid panel after holidays 5. Sinus pressure mucinex-trial-d/w pt if no improvement-augmentin rx given for treatment-risk/benefit/side effect-concern for worsening symptoms with holiday pt might need uc evaluation-with COVID cases climbing d/w pt would prefer trial of augmentin if continued sinus pressure, if additional concerns evaluation at UC Follow-up: fasting labwork  Drayden Lukas Mat Carne, MD

## 2019-07-29 ENCOUNTER — Other Ambulatory Visit: Payer: Self-pay | Admitting: Family Medicine

## 2019-07-29 LAB — CBC WITH DIFFERENTIAL/PLATELET
Absolute Monocytes: 662 cells/uL (ref 200–950)
Basophils Absolute: 41 cells/uL (ref 0–200)
Basophils Relative: 0.6 %
Eosinophils Absolute: 117 cells/uL (ref 15–500)
Eosinophils Relative: 1.7 %
HCT: 45.8 % — ABNORMAL HIGH (ref 35.0–45.0)
Hemoglobin: 15.6 g/dL — ABNORMAL HIGH (ref 11.7–15.5)
Lymphs Abs: 2546 cells/uL (ref 850–3900)
MCH: 30.6 pg (ref 27.0–33.0)
MCHC: 34.1 g/dL (ref 32.0–36.0)
MCV: 89.8 fL (ref 80.0–100.0)
MPV: 10.9 fL (ref 7.5–12.5)
Monocytes Relative: 9.6 %
Neutro Abs: 3533 cells/uL (ref 1500–7800)
Neutrophils Relative %: 51.2 %
Platelets: 246 10*3/uL (ref 140–400)
RBC: 5.1 10*6/uL (ref 3.80–5.10)
RDW: 12 % (ref 11.0–15.0)
Total Lymphocyte: 36.9 %
WBC: 6.9 10*3/uL (ref 3.8–10.8)

## 2019-07-29 LAB — LIPID PANEL
Cholesterol: 255 mg/dL — ABNORMAL HIGH (ref <200)
HDL: 57 mg/dL (ref >=50)
LDL Cholesterol (Calc): 162 mg/dL (calc) — ABNORMAL HIGH
Non-HDL Cholesterol (Calc): 198 mg/dL (calc) — ABNORMAL HIGH (ref <130)
Total CHOL/HDL Ratio: 4.5 (calc) (ref <5.0)
Triglycerides: 200 mg/dL — ABNORMAL HIGH (ref <150)

## 2019-07-29 LAB — COMPLETE METABOLIC PANEL WITH GFR
AG Ratio: 1.4 (calc) (ref 1.0–2.5)
ALT: 15 U/L (ref 6–29)
AST: 17 U/L (ref 10–35)
Albumin: 4.2 g/dL (ref 3.6–5.1)
Alkaline phosphatase (APISO): 94 U/L (ref 37–153)
BUN: 14 mg/dL (ref 7–25)
CO2: 33 mmol/L — ABNORMAL HIGH (ref 20–32)
Calcium: 9.7 mg/dL (ref 8.6–10.4)
Chloride: 100 mmol/L (ref 98–110)
Creat: 0.64 mg/dL (ref 0.50–0.99)
GFR, Est African American: 111 mL/min/{1.73_m2} (ref >=60)
GFR, Est Non African American: 96 mL/min/{1.73_m2} (ref >=60)
Globulin: 3 g/dL (calc) (ref 1.9–3.7)
Glucose, Bld: 87 mg/dL (ref 65–99)
Potassium: 4.2 mmol/L (ref 3.5–5.3)
Sodium: 138 mmol/L (ref 135–146)
Total Bilirubin: 0.6 mg/dL (ref 0.2–1.2)
Total Protein: 7.2 g/dL (ref 6.1–8.1)

## 2019-08-05 ENCOUNTER — Telehealth: Payer: Self-pay | Admitting: Family Medicine

## 2019-08-05 NOTE — Telephone Encounter (Signed)
Patient is calling and requesting her lab results. Patient states she got her labs done at [facility] on 11/22. CB  [phone]

## 2019-08-05 NOTE — Telephone Encounter (Signed)
Routing to Dr. Corum for results 

## 2019-08-11 ENCOUNTER — Other Ambulatory Visit: Payer: Self-pay

## 2019-08-11 ENCOUNTER — Telehealth (INDEPENDENT_AMBULATORY_CARE_PROVIDER_SITE_OTHER): Payer: PRIVATE HEALTH INSURANCE | Admitting: Family Medicine

## 2019-08-11 DIAGNOSIS — E781 Pure hyperglyceridemia: Secondary | ICD-10-CM | POA: Diagnosis not present

## 2019-08-12 NOTE — Patient Instructions (Signed)
Diet and exercise modification Tuna or Salmon -2 servings/week

## 2019-08-12 NOTE — Progress Notes (Signed)
Virtual Visit via Telephone Note  I connected with Victoria Page on 08/12/19 at 11:20 AM EST by telephone and verified that I am speaking with the correct person using two identifiers.DOB/address  Location Patient:home Provider:clinic   I discussed the limitations, risks, security and privacy concerns of performing an evaluation and management service by telephone and the availability of in person appointments. I also discussed with the patient that there may be a patient responsible charge related to this service. The patient expressed understanding and agreed to proceed.   History of Present Illness:   elevated LDL cholesterol-no medication Takes omega 3 for triglycerides Pt states she is watching diet carefully Observations/Objective: none  Assessment and Plan:  1. Hypertriglyceridemia Omega 3-non statin currently-risk/benefit/side effects d/w pt Diet modificaiton Follow Up Instructions:recheck lipid panel in 6 months    I discussed the assessment and treatment plan with the patient. The patient was provided an opportunity to ask questions and all were answered. The patient agreed with the plan and demonstrated an understanding of the instructions.   The patient was advised to call back or seek an in-person evaluation for evaluation-blood work prior  I provided 10 minutes of non-face-to-face time during this encounter.   Shelonda Saxe Hannah Beat, MD

## 2019-08-18 ENCOUNTER — Other Ambulatory Visit (HOSPITAL_COMMUNITY): Payer: Self-pay | Admitting: Family Medicine

## 2019-08-18 DIAGNOSIS — Z1231 Encounter for screening mammogram for malignant neoplasm of breast: Secondary | ICD-10-CM

## 2019-08-21 ENCOUNTER — Other Ambulatory Visit: Payer: Self-pay

## 2019-08-21 ENCOUNTER — Ambulatory Visit (HOSPITAL_COMMUNITY)
Admission: RE | Admit: 2019-08-21 | Discharge: 2019-08-21 | Disposition: A | Discharge status: Home / Self Care | Payer: PRIVATE HEALTH INSURANCE | Source: Ambulatory Visit | Attending: Family Medicine | Admitting: Family Medicine

## 2019-08-21 DIAGNOSIS — Z1231 Encounter for screening mammogram for malignant neoplasm of breast: Secondary | ICD-10-CM

## 2019-08-25 ENCOUNTER — Other Ambulatory Visit: Payer: Self-pay

## 2019-08-25 ENCOUNTER — Ambulatory Visit (INDEPENDENT_AMBULATORY_CARE_PROVIDER_SITE_OTHER): Payer: PRIVATE HEALTH INSURANCE | Admitting: Family Medicine

## 2019-08-25 ENCOUNTER — Encounter: Payer: Self-pay | Admitting: Family Medicine

## 2019-08-25 VITALS — BP 130/90 | HR 79 | Temp 98.2°F | Ht 64.0 in | Wt 172.0 lb

## 2019-08-25 DIAGNOSIS — I10 Essential (primary) hypertension: Secondary | ICD-10-CM

## 2019-08-25 DIAGNOSIS — K219 Gastro-esophageal reflux disease without esophagitis: Secondary | ICD-10-CM | POA: Diagnosis not present

## 2019-08-25 DIAGNOSIS — M8580 Other specified disorders of bone density and structure, unspecified site: Secondary | ICD-10-CM | POA: Diagnosis not present

## 2019-08-25 DIAGNOSIS — E785 Hyperlipidemia, unspecified: Secondary | ICD-10-CM

## 2019-08-25 DIAGNOSIS — Z0000 Encounter for general adult medical examination without abnormal findings: Secondary | ICD-10-CM | POA: Diagnosis not present

## 2019-08-25 DIAGNOSIS — E781 Pure hyperglyceridemia: Secondary | ICD-10-CM

## 2019-08-25 NOTE — Progress Notes (Signed)
12/21/20201:55 PM  Victoria Page Jan 22, 1956, 63 y.o., female [id number]  Chief Complaint  Patient presents with  . Annual Exam    HPI:  Osteopenia-DEXA  2018 HTN-slightly elevated with pt  not taking HCTZ on a regular basis GERD-pepcid did not help along with omeprazole-gallstone noted-biliary concern likely related to stone-pt does not wish to have surgery Burping and pain noted -no fried foods, pt feels very unlikely related to gallstones Eye lid surgery -recommended due to interferences with vision Fall Risk  07/28/2019 11/12/2018 08/13/2018  Falls in the past year? 0 0 1  Number falls in past yr: 0 - 0  Injury with Fall? 0 - 1   Depression screen [facility] 2/9 07/28/2019 08/13/2018  Decreased Interest 0 0  Down, Depressed, Hopeless 0 0  PHQ - 2 Score 0 0    Allergies  Allergen Reactions  . Shrimp [Shellfish Allergy] Nausea And Vomiting    Prior to Admission medications   Medication Sig Start Date End Date Taking? Authorizing Provider  ALPRAZolam Duanne Moron) 1 MG tablet Take 0.5-1 mg by mouth 2 (two) times daily as needed. 07/07/19   [provider]  amoxicillin (AMOXIL) 875 MG tablet Take 1 tablet (875 mg total) by mouth 2 (two) times daily. 07/28/19   Caycee Wanat, Rex Kras, MD  calcium-vitamin D (OSCAL WITH D) 500-200 MG-UNIT tablet Take 1 tablet by mouth daily.    [provider]  dicyclomine (BENTYL) 10 MG capsule Take 10 mg by mouth 4 (four) times daily as needed for spasms.    [provider]  famotidine (PEPCID) 20 MG tablet Take 1 tablet (20 mg total) by mouth 2 (two) times daily. 07/28/19   Qiara Minetti, Rex Kras, MD  hydrochlorothiazide (HYDRODIURIL) 25 MG tablet Take 25 mg by mouth daily.    [provider]  IBU 800 MG tablet Take 800 mg by mouth 3 (three) times daily as needed. 07/01/19   [provider]  Omega-3 Fatty Acids (FISH OIL PO) Take 1 capsule by mouth daily.    [provider]  omeprazole (PRILOSEC) 40 MG  capsule Take 1 capsule (40 mg total) by mouth daily. 01/09/19   Setzer, Rona Ravens, NP    Past Medical History:  Diagnosis Date  . Gallstones   . GERD (gastroesophageal reflux disease)   . Hypertension   . IBS (irritable bowel syndrome)   . LLQ pain 08/19/2014  . Plantar fasciitis     Past Surgical History:  Procedure Laterality Date  . COLONOSCOPY N/A 09/06/2016   Procedure: COLONOSCOPY;  Surgeon: Rogene Houston, MD;  Location[facility];  Service: Endoscopy;  Laterality: N/A;  8:30  . FLEXIBLE SIGMOIDOSCOPY      Social History   Tobacco Use  . Smoking status: Never Smoker  . Smokeless tobacco: Never Used  Substance Use Topics  . Alcohol use: Yes    Comment: "Once every 2 months"    Social History   Social History Narrative  . Not on file    Family History  Problem Relation Age of Onset  . Lung cancer Mother   . Heart attack Father   . Diverticulitis Paternal Grandmother   . Diverticulitis Brother        died from septic shock  . Breast cancer Daughter 73  . COPD Brother   . Colon cancer Neg Hx     ROS: Constitutional: no loss of appetite,  unexplained weight loss/gain HEENT: no difficulty with hearing, sinus problems-ongoing, glasses -utd -reading /  driving, small cataract-w/i the last year evaluation CV: no irregular heartbeat, no chest pain, no swelling of the feet or legs, leg pain with walking  RESP: no short of breath, no  prolonged cough, no wheezing,no sputum production GI: no egd in the past-omeprazole , no longer taking pepcid, gallstone noted-pt does not want the stone taken out, burping the worse concern GU: no painful urination, no frequent of urination, no urgency, no bladder problems MS: right knee- joint pain, no swelling in the joints,  Osteopenia-noted on DEXA 2018 Skin:no persistent rash, new skin lesions, no hair loss Neuro:sinus headaches with cluster in the early morning, no  weakness, dizziness or LOC PSY: no insomnia, depression,  anxiety Endo: no intolerance to heat or cold, no increase in thirst Heme: no easy brusing, anemia, swollen lymph nodes Allergy: seasonal allergies-no shots   Today's Vitals   08/25/19 1345  BP: 130/90  Pulse: 79  Temp: 98.2 F (36.8 C)  TempSrc: Oral  SpO2: 97%  Weight: 172 lb (78 kg)  Height: 5\' 4"  (1.626 m)   Body mass index is 29.52 kg/m.   EXAM:  Constitutional:  HEENT: normocephalic, atraumatic, scalp with no lesions, no hair loss Sclera white, conjunctiva non injected,  External ear bilat  with no lesions or masses, auditory canal bilat normal Eardrum bilat with positive light reflex Nose clear drainage with no lesions and swelling of the turbinates bilat Throat and mouth with good dentition, no lesions, no bleeding Pharynx and tonsils with no exudate or erythema Neck: normal flexion/extension/rotation Thyroid: no enlargement Lungs: clear breath sounds to auscultation Heart: no lifts or heaves , normal S1/2 with no murmur on auscultation Abdomen: no scars or striae, normal bowel sounds, no tenderness to palpation, liver and spleen non palpable Back: no CVA tenderness  Ext: upper- normal flexion/extension-wrists, elbows, shoulders  lower-normal flexion-knees, hips, ankles, pulses posterior tibial and dorsalis pedis 2+ bilat equal Lymph nodes-anterior and posterior cervical not palpable Neuro: awake and alert, oriented to person, place and time, normal gait and balance   ASSESSMENT/PLAN: 1. Osteopenia, unspecified location - DG Bone Density; Future Calcium + Vit D 2. Gastroesophageal reflux disease without esophagitis Omeprazole-staple-continued burping 3. Essential hypertension HCTZ 25mg  Reviewed labwork 4. Routine adult health maintenance Mammogram normal DEXA suggested 5. Hypertriglyceridemia Omega 3   , MD

## 2019-08-25 NOTE — Patient Instructions (Addendum)
Calcium 500-600mg  +Vit D +dairy in the diet( recommendation for 1200mg  Ca/day) Recommended Vit D 2000IU daily DEXA

## 2019-08-26 ENCOUNTER — Telehealth: Payer: Self-pay | Admitting: Family Medicine

## 2019-08-26 ENCOUNTER — Other Ambulatory Visit: Payer: Self-pay | Admitting: Family Medicine

## 2019-08-26 MED ORDER — DICLOFENAC SODIUM 1 % EX GEL: 2.0000 g | Freq: Four times a day (QID) | CUTANEOUS | 0 refills | Status: DC

## 2019-08-26 NOTE — Telephone Encounter (Signed)
Routing to Dr. Corum for advice ? 

## 2019-08-26 NOTE — Telephone Encounter (Signed)
[place] states that the pharmacist told her a Rx for Neuropathy Medication (cream) would be better and less expensive, please send to [facility]. Urine will be done at next appointment

## 2019-08-26 NOTE — Telephone Encounter (Signed)
Not sure what medication pt is referring to that pharmacy wants called rather than otc  Urine can be completed at next appointment unless pt having acute symptoms

## 2019-08-26 NOTE — Telephone Encounter (Signed)
Patient is calling and was seen yesterday and states her and Dr. Holly Bodily discussed a urine specimen but she never did one, she also states tingling in hand and feet was discussed and the pharmacy told her a prescription cream for this works better than over the counter and would like to know if that could be called into [facility]

## 2019-09-18 ENCOUNTER — Other Ambulatory Visit: Payer: Self-pay

## 2019-09-18 ENCOUNTER — Encounter (INDEPENDENT_AMBULATORY_CARE_PROVIDER_SITE_OTHER): Payer: Self-pay | Admitting: Gastroenterology

## 2019-09-18 ENCOUNTER — Ambulatory Visit (INDEPENDENT_AMBULATORY_CARE_PROVIDER_SITE_OTHER): Payer: PRIVATE HEALTH INSURANCE | Admitting: Gastroenterology

## 2019-09-18 VITALS — BP 133/89 | HR 75 | Temp 97.2°F | Ht 65.0 in | Wt 171.6 lb

## 2019-09-18 DIAGNOSIS — R1012 Left upper quadrant pain: Secondary | ICD-10-CM

## 2019-09-18 DIAGNOSIS — R1032 Left lower quadrant pain: Secondary | ICD-10-CM

## 2019-09-18 DIAGNOSIS — R110 Nausea: Secondary | ICD-10-CM

## 2019-09-18 MED ORDER — HYOSCYAMINE SULFATE 0.125 MG SL SUBL: 0.1250 mg | SUBLINGUAL_TABLET | Freq: Four times a day (QID) | SUBLINGUAL | 0 refills | Status: DC | PRN

## 2019-09-18 NOTE — Patient Instructions (Signed)
We are scheduling a CT scan for evaluation-we are waiting to get approval from your insurance prior to scheduling this.  I am checking labs today.  I sent hyoscyamine to your pharmacy to try, this dissolves under your tongue and is for abdominal pain.  Continue very bland diet in interim.

## 2019-09-18 NOTE — Progress Notes (Addendum)
Patient profile: Victoria Page is a 64 y.o. female seen for evaluation of abd pain-last seen May 2020  History of Present Illness: Victoria Page is seen today for abdominal pain-she has been followed chronically in the past for intermittent abdominal pain, previously felt related to combination of biliary colic and irritable bowel.  She reports developing an attack 5 days ago that lasted several days to the point of severity she could not sleep for 3 nights in a row.  Previously attacks have been triggered by possible fatty foods, she did eat [employer] before the pain began.  She describes pain in her mid and lower abdomen that radiates to her back, clearly worse on the left than the right.  Does notice it worsens significant when laying flat. She also notes is very painful to eat during the episodes and she is scared to eat.  She typically goes to a bland diet the first day such as oatmeal, soups, toast, etc.  Pain does improve a little bit if she can have a bowel movement during the episode.  Feels these episodes have been chronic but are becoming much more severe in intensity. Occurring few times per year.  Associated with significant nausea during episodes without vomiting.  She chronically has GERD symptoms with Pepcid that is generally well controlled.  She does have increased burping and belching during the episodes.  She reports trying omeprazole which did not improve her symptoms.  She has also dicyclomine during the episodes which did not help.  Chronically bowel habits are several times a day and looser, during episodes they tend to be more formed given not eating.  She denies any blood in stool or black stool.   She denies any tobacco alcohol or NSAIDs.  Wt Readings from Last 3 Encounters:  09/18/19 171 lb 9.6 oz (77.8 kg)  08/25/19 172 lb (78 kg)  07/28/19 173 lb 3.2 oz (78.6 kg)     Last Colonoscopy: 2018-entire colon normal, external hemorrhoids Last  Endoscopy: none prior    Past Medical History:  Past Medical History:  Diagnosis Date  . Gallstones   . GERD (gastroesophageal reflux disease)   . Hypertension   . IBS (irritable bowel syndrome)   . LLQ pain 08/19/2014  . Plantar fasciitis     Problem List: Patient Active Problem List   Diagnosis Date Noted  . Osteopenia 08/25/2019  . Gastroesophageal reflux disease without esophagitis 07/28/2019  . Essential hypertension 07/28/2019  . Anxiety 07/28/2019  . Hypertriglyceridemia 07/28/2019  . Sinus pressure 07/28/2019  . History of ovarian cyst 08/13/2018  . Screening for colorectal cancer 08/13/2018  . Routine adult health maintenance 08/13/2018  . Uterine tenderness 11/16/2017  . Vaginal atrophy 11/16/2017  . Vulvar irritation 11/16/2017  . UTI (urinary tract infection) 11/13/2017  . Encounter for screening colonoscopy 09/05/2016  . LLQ pain 08/19/2014  . CERVICAL RADICULITIS 06/12/2007  . SHOULDER PAIN 05/14/2007  . NECK PAIN, ACUTE 05/14/2007  . HIGH BLOOD PRESSURE 03/05/2007    Past Surgical History: Past Surgical History:  Procedure Laterality Date  . COLONOSCOPY N/A 09/06/2016   Procedure: COLONOSCOPY;  Surgeon: Rogene Houston, MD;  Location: [facility];  Service: Endoscopy;  Laterality: N/A;  8:30  . FLEXIBLE SIGMOIDOSCOPY      Allergies: Allergies  Allergen Reactions  . Shrimp [Shellfish Allergy] Nausea And Vomiting      Home Medications:  Current Outpatient Medications:  .  ALPRAZolam (XANAX) 1 MG tablet, Take 0.5-1 mg  by mouth 2 (two) times daily as needed., Disp: , Rfl:  .  calcium-vitamin D (OSCAL WITH D) 500-200 MG-UNIT tablet, Take 1 tablet by mouth daily., Disp: , Rfl:  .  diclofenac Sodium (VOLTAREN) 1 % GEL, Apply 2 g topically 4 (four) times daily., Disp: 100 g, Rfl: 0 .  famotidine (PEPCID) 20 MG tablet, Take 20 mg by mouth 2 (two) times daily., Disp: , Rfl:  .  hydrochlorothiazide (HYDRODIURIL) 25 MG tablet, Take 25 mg by mouth daily.,  Disp: , Rfl:  .  Omega-3 Fatty Acids (FISH OIL PO), Take 1 capsule by mouth daily., Disp: , Rfl:  .  dicyclomine (BENTYL) 10 MG capsule, Take 10 mg by mouth 4 (four) times daily as needed for spasms., Disp: , Rfl:  .  hyoscyamine (LEVSIN SL) 0.125 MG SL tablet, Place 1 tablet (0.125 mg total) under the tongue every 6 (six) hours as needed (abd pain)., Disp: 30 tablet, Rfl: 0 .  IBU 800 MG tablet, Take 800 mg by mouth 3 (three) times daily as needed., Disp: , Rfl:  .  omeprazole (PRILOSEC) 40 MG capsule, Take 1 capsule (40 mg total) by mouth daily. (Patient not taking: Reported on 09/18/2019), Disp: 90 capsule, Rfl: 3   Family History: family history includes Breast cancer (age of onset: 4) in her daughter; COPD in her brother; Diverticulitis in her brother and paternal grandmother; Heart attack in her father; Lung cancer in her mother.   No known family history colon polyps or colon cancer    Social History:   reports that she has never smoked. She has never used smokeless tobacco. She reports current alcohol use. She reports that she does not use drugs.   Review of Systems: Constitutional: Denies weight loss/weight gain  Eyes: No changes in vision. ENT: No oral lesions, sore throat.  GI: see HPI.  Heme/Lymph: No easy bruising.  CV: No chest pain.  GU: No hematuria.  Integumentary: No rashes.  Neuro: No headaches.  Psych: No depression/anxiety.  Endocrine: No heat/cold intolerance.  Allergic/Immunologic: No urticaria.  Resp: No cough, SOB.  Musculoskeletal: No joint swelling.    Physical Examination: BP 133/89 (BP Location: Right Arm, Patient Position: Sitting, Cuff Size: Large)   Pulse 75   Temp (!) 97.2 F (36.2 C) (Temporal)   Ht 5\' 5"  (1.651 m)   Wt 171 lb 9.6 oz (77.8 kg)   BMI 28.56 kg/m  Gen: NAD, alert and oriented x 4 HEENT: PEERLA, EOMI, Neck: supple, no JVD Chest: CTA bilaterally, no wheezes, crackles, or other adventitious sounds CV: RRR, no m/g/c/r Abd:  significant TTP LUQ, left flank and LLQ. Non tender RUQ. Normal bowel sounds. Non distended. No rebound/guarding  Ext: no edema, well perfused with 2+ pulses, Skin: no rash or lesions noted on observed skin Lymph: no noted LAD  Data Reviewed:  01/2019- 02/2019 IMPRESSION: Cholelithiasis without sonographic evidence of acute cholecystitis.  01/2019-HIDA IMPRESSION: Normal ejection fraction of radiotracer from the gallbladder. The patient did experience abdominal pain with the oral Ensure consumption. Cystic and common bile ducts are patent as is evidencedby visualization of gallbladder and small bowel. EF 98%  07/2019-CBC with hemoglobin 15.6, otherwise normal, CMP normal except CO2 33  CT 2018 after fall-Pancreas: No evidence of injury. No ductal dilatation or inflammation. Fatty atrophy of the pancreatic head  CT 2017-IMPRESSION: Diffuse decreased attenuation in the head and uncinate process of the pancreas consistent with focal fatty infiltration. No definitive mass lesion is seen.  Assessment/Plan: Ms. Jasslyn Finkel is a 64 y.o. female   1.  Abdominal pain-more prominent on left abdomen associated with nausea-reports chronic issues with "attacks" which are becoming worse in severity.  This week unable to sleep x3 nights due to worsening pain with laying flat.  Unresponsive to dicyclomine.  Improved somewhat bland diet.  She has known history of gallstones and did have pain with Ensure ingestion on HIDA last year but feels pain she is currently having is much more severe than after Ensure.  She is specifically more tender on left than right so lower suspicion for gallbladder. history of some pancreatic atrophy on prior CT.  Will get repeat CT scan for evaluation, this will be ordered ASAP- r/o pancreatitis, diverticulitis, other abd pathology.  Will also check labs as above.  We will try Levsin in interim.  Bland diet reviewed.  Further recommendations pending  [name] was seen today for  follow-up.  Diagnoses and all orders for this visit:  LUQ pain -     CBC with Differential -     CMP -     Amylase -     Lipase  LLQ pain -     CBC with Differential -     CMP -     Amylase -     Lipase  Nausea without vomiting -     CBC with Differential -     CMP -     Amylase -     Lipase  Other orders -     hyoscyamine (LEVSIN SL) 0.125 MG SL tablet; Place 1 tablet (0.125 mg total) under the tongue every 6 (six) hours as needed (abd pain).   CT a/p ordered STAT   I personally performed the service, non-incident to. (WP)  Tawni Pummel, [facility]

## 2019-09-19 LAB — CBC WITH DIFFERENTIAL/PLATELET
Absolute Monocytes: 589 cells/uL (ref 200–950)
Basophils Absolute: 28 cells/uL (ref 0–200)
Basophils Relative: 0.5 %
Eosinophils Absolute: 110 cells/uL (ref 15–500)
Eosinophils Relative: 2 %
HCT: 45.6 % — ABNORMAL HIGH (ref 35.0–45.0)
Hemoglobin: 15.4 g/dL (ref 11.7–15.5)
Lymphs Abs: 1898 cells/uL (ref 850–3900)
MCH: 30.3 pg (ref 27.0–33.0)
MCHC: 33.8 g/dL (ref 32.0–36.0)
MCV: 89.8 fL (ref 80.0–100.0)
MPV: 10.9 fL (ref 7.5–12.5)
Monocytes Relative: 10.7 %
Neutro Abs: 2877 cells/uL (ref 1500–7800)
Neutrophils Relative %: 52.3 %
Platelets: 242 10*3/uL (ref 140–400)
RBC: 5.08 10*6/uL (ref 3.80–5.10)
RDW: 12.1 % (ref 11.0–15.0)
Total Lymphocyte: 34.5 %
WBC: 5.5 10*3/uL (ref 3.8–10.8)

## 2019-09-19 LAB — COMPLETE METABOLIC PANEL WITH GFR
AG Ratio: 1.4 (calc) (ref 1.0–2.5)
ALT: 9 U/L (ref 6–29)
AST: 15 U/L (ref 10–35)
Albumin: 4.1 g/dL (ref 3.6–5.1)
Alkaline phosphatase (APISO): 88 U/L (ref 37–153)
BUN: 13 mg/dL (ref 7–25)
CO2: 30 mmol/L (ref 20–32)
Calcium: 9.7 mg/dL (ref 8.6–10.4)
Chloride: 101 mmol/L (ref 98–110)
Creat: 0.8 mg/dL (ref 0.50–0.99)
GFR, Est African American: 92 mL/min/{1.73_m2} (ref >=60)
GFR, Est Non African American: 79 mL/min/{1.73_m2} (ref >=60)
Globulin: 3 g/dL (calc) (ref 1.9–3.7)
Glucose, Bld: 92 mg/dL (ref 65–139)
Potassium: 4.2 mmol/L (ref 3.5–5.3)
Sodium: 139 mmol/L (ref 135–146)
Total Bilirubin: 0.4 mg/dL (ref 0.2–1.2)
Total Protein: 7.1 g/dL (ref 6.1–8.1)

## 2019-09-19 LAB — AMYLASE: Amylase: 47 U/L (ref 21–101)

## 2019-09-19 LAB — LIPASE: Lipase: 22 U/L (ref 7–60)

## 2019-09-22 ENCOUNTER — Telehealth (INDEPENDENT_AMBULATORY_CARE_PROVIDER_SITE_OTHER): Payer: Self-pay | Admitting: Gastroenterology

## 2019-09-22 ENCOUNTER — Telehealth (INDEPENDENT_AMBULATORY_CARE_PROVIDER_SITE_OTHER): Payer: Self-pay | Admitting: *Deleted

## 2019-09-22 DIAGNOSIS — R110 Nausea: Secondary | ICD-10-CM

## 2019-09-22 DIAGNOSIS — R1032 Left lower quadrant pain: Secondary | ICD-10-CM

## 2019-09-22 DIAGNOSIS — R1012 Left upper quadrant pain: Secondary | ICD-10-CM

## 2019-09-22 NOTE — Telephone Encounter (Signed)
Ann - order in chart for CT to be scheduled this week, thanks!

## 2019-09-22 NOTE — Telephone Encounter (Signed)
CT sch'd 09/25/19, patient aware

## 2019-09-22 NOTE — Telephone Encounter (Signed)
Please call patient, she has questions about the need for her CT - I have this sch'd for this Thursday [phone])

## 2019-09-23 MED ORDER — PANTOPRAZOLE SODIUM 40 MG PO TBEC: 40.0000 mg | DELAYED_RELEASE_TABLET | Freq: Two times a day (BID) | ORAL | 3 refills | Status: DC

## 2019-09-23 NOTE — Telephone Encounter (Signed)
CT Has been canceled.

## 2019-09-23 NOTE — Telephone Encounter (Addendum)
I dicussed symptoms w/ patient. She is feeling some better compared to when in office but still having pain, has been able to sleep through pain last few nights. Pain worsens w/ eating. Still doing very bland diet.   Given high cost of ct and she has had some improvement in pain particulary at night she would like to hold off on CT - Ann please cancel w/ radiology.   She would like to try high dose protonix, sent to pharmacy, has been on low dose omeprazole w/o improvement in past. Rx sent to pharmacy.   Also needs OV f/up with me in 3-4 weeks thanks.

## 2019-09-23 NOTE — Addendum Note (Signed)
Addended by: Tawni Pummel A on: 09/23/2019 07:30 AM   Modules accepted: Orders

## 2019-09-23 NOTE — Addendum Note (Signed)
Addended by: Tawni Pummel A on: 09/23/2019 10:16 AM   Modules accepted: Orders

## 2019-09-25 ENCOUNTER — Ambulatory Visit (HOSPITAL_COMMUNITY): Payer: Self-pay

## 2019-09-25 ENCOUNTER — Telehealth (INDEPENDENT_AMBULATORY_CARE_PROVIDER_SITE_OTHER): Payer: Self-pay | Admitting: Gastroenterology

## 2019-09-25 NOTE — Telephone Encounter (Signed)
Please notify patient her celiac panel labs are not back yet-I should get this Monday or Tuesday and will notify her when I do.

## 2019-09-25 NOTE — Telephone Encounter (Signed)
Patient called regarding results - please advise - ph# [phone]

## 2019-09-26 LAB — CELIAC DISEASE PANEL
(tTG) Ab, IgA: 1 U/mL
(tTG) Ab, IgG: 2 U/mL
Gliadin IgA: 9 Units
Gliadin IgG: 2 Units
Immunoglobulin A: 341 mg/dL — ABNORMAL HIGH (ref 70–320)

## 2019-09-29 ENCOUNTER — Telehealth (INDEPENDENT_AMBULATORY_CARE_PROVIDER_SITE_OTHER): Payer: Self-pay | Admitting: Gastroenterology

## 2019-09-29 NOTE — Telephone Encounter (Signed)
Patient called stating she had thought of something that happened a few months ago - stated she went to Dr Kenton Kingfisher because she was having this green vaginal discharge - everything he did came back negative but thought maybe it had something to do with her abd pain - please advise - ph# [phone]

## 2019-09-30 NOTE — Telephone Encounter (Signed)
Please notify patient I do not think this contributes to her GI symptoms but appreciate the update. Thanks.

## 2019-10-01 ENCOUNTER — Telehealth (INDEPENDENT_AMBULATORY_CARE_PROVIDER_SITE_OTHER): Payer: Self-pay | Admitting: Gastroenterology

## 2019-10-01 MED ORDER — POLYETHYLENE GLYCOL 3350 17 GM/SCOOP PO POWD: 17.0000 g | Freq: Every day | ORAL | 1 refills | Status: DC

## 2019-10-01 NOTE — Telephone Encounter (Signed)
If patient feels she is constipated not moving her bowels well she can do several doses of MiraLAX to help get stools moving better. Please let her know, thanks

## 2019-10-01 NOTE — Telephone Encounter (Signed)
Victoria Page - would you please send in a RX for the Miralax to [facility]. It may be cheaper for the patient than buying over the counter?

## 2019-10-01 NOTE — Telephone Encounter (Signed)
Patient left voice mail message stating she thinks she needs some kind of abdominal cleansing - please advise - [phone]

## 2019-10-01 NOTE — Telephone Encounter (Signed)
MiraLAX refill sent to pharmacy for patient to see if it is cheaper.

## 2019-10-01 NOTE — Addendum Note (Signed)
Addended by: Tawni Pummel A on: 10/01/2019 01:24 PM   Modules accepted: Orders

## 2019-11-26 ENCOUNTER — Telehealth: Payer: Self-pay | Admitting: Family Medicine

## 2019-11-27 MED ORDER — FISH OIL 1000 MG PO CAPS: 1.0000 | ORAL_CAPSULE | Freq: Every day | ORAL | 1 refills | Status: DC

## 2019-11-27 NOTE — Telephone Encounter (Signed)
Patient is calling and states she was notified by her pharmacy that this was refused due to her being allergic to shell fish. Patient states she has been taking fish oil for over a year, and is not allergic to shell fish and would like this taken off her chart so it could be filled. Patient states she is only allergic to shrimp.

## 2019-11-27 NOTE — Addendum Note (Signed)
Addended by: Lorenda Hatchet R on: 11/27/2019 01:26 PM   Modules accepted: Orders

## 2019-11-27 NOTE — Telephone Encounter (Signed)
Rx has been sent  

## 2019-12-18 ENCOUNTER — Other Ambulatory Visit: Payer: Self-pay | Admitting: Family Medicine

## 2020-02-04 ENCOUNTER — Telehealth (INDEPENDENT_AMBULATORY_CARE_PROVIDER_SITE_OTHER): Payer: Self-pay | Admitting: Gastroenterology

## 2020-02-04 NOTE — Telephone Encounter (Signed)
Patient called left voice mail message stating she saw Liborio Nixon back at the beginning of the year - would like to know if she could get a prescription for Linzess - please advise ph# [phone]

## 2020-02-06 NOTE — Telephone Encounter (Signed)
Patient was called and given information.

## 2020-02-06 NOTE — Telephone Encounter (Signed)
I will send Rx for linzess - will start at middle dose of , please let her know to take 30 min before breakfast. We can increase or decrease dose as needed. Thanks.

## 2020-02-11 ENCOUNTER — Other Ambulatory Visit (INDEPENDENT_AMBULATORY_CARE_PROVIDER_SITE_OTHER): Payer: Self-pay | Admitting: Gastroenterology

## 2020-02-11 MED ORDER — LINACLOTIDE 145 MCG PO CAPS: 145.0000 ug | ORAL_CAPSULE | Freq: Every day | ORAL | 3 refills | Status: DC

## 2020-02-11 NOTE — Progress Notes (Signed)
Rx for linzess sent to pharmacy

## 2020-02-15 ENCOUNTER — Ambulatory Visit
Admission: EM | Admit: 2020-02-15 | Discharge: 2020-02-15 | Disposition: A | Discharge status: Home / Self Care | Payer: 59 | Attending: Emergency Medicine | Admitting: Emergency Medicine

## 2020-02-15 ENCOUNTER — Other Ambulatory Visit: Payer: Self-pay

## 2020-02-15 ENCOUNTER — Ambulatory Visit: Payer: Self-pay

## 2020-02-15 DIAGNOSIS — Z20822 Contact with and (suspected) exposure to covid-19: Secondary | ICD-10-CM

## 2020-02-15 DIAGNOSIS — J069 Acute upper respiratory infection, unspecified: Secondary | ICD-10-CM | POA: Diagnosis not present

## 2020-02-15 MED ORDER — BENZONATATE 100 MG PO CAPS
100.0000 mg | ORAL_CAPSULE | Freq: Three times a day (TID) | ORAL | 0 refills | Status: DC
Start: 2020-02-15 — End: 2020-03-03

## 2020-02-15 MED ORDER — PREDNISONE 20 MG PO TABS
20.0000 mg | ORAL_TABLET | Freq: Two times a day (BID) | ORAL | 0 refills | Status: AC
Start: 2020-02-15 — End: 2020-02-20

## 2020-02-15 NOTE — ED Triage Notes (Signed)
Pt presents with c/o nasal congestion and cough for past couple of days

## 2020-02-15 NOTE — ED Provider Notes (Signed)
[facility]   [id number] 02/15/20 Arrival Time: 1052   CC: COVID symptoms  SUBJECTIVE: History from: patient.  Victoria Page is a 64 y.o. female who presents with sinus pain/ pressure in forehead, runny nose, nasal congestion and cough x 2 days.  Denies sick exposure to COVID, flu or strep.  Denies recent travel.  Has tried OTC medications without relief.  Symptoms are made worse at night.  Reports previous symptoms in the past.   Denies fever, chills, sore throat, SOB, wheezing, chest pain, nausea, changes in bowel or bladder habits.     ROS: As per HPI.  All other pertinent ROS negative.     Past Medical History:  Diagnosis Date  . Gallstones   . GERD (gastroesophageal reflux disease)   . Hypertension   . IBS (irritable bowel syndrome)   . LLQ pain 08/19/2014  . Plantar fasciitis    Past Surgical History:  Procedure Laterality Date  . COLONOSCOPY N/A 09/06/2016   Procedure: COLONOSCOPY;  Surgeon: Malissa Hippo, MD;  Location: [facility];  Service: Endoscopy;  Laterality: N/A;  8:30  . FLEXIBLE SIGMOIDOSCOPY     Allergies  Allergen Reactions  . Shrimp [Shellfish Allergy] Nausea And Vomiting   No current facility-administered medications on file prior to encounter.   Current Outpatient Medications on File Prior to Encounter  Medication Sig Dispense Refill  . ALPRAZolam (XANAX) 1 MG tablet Take 0.5-1 mg by mouth 2 (two) times daily as needed.    . calcium-vitamin D (OSCAL WITH D) 500-200 MG-UNIT tablet Take 1 tablet by mouth daily.    . diclofenac Sodium (VOLTAREN) 1 % GEL Apply 2 g topically 4 (four) times daily. 100 g 0  . dicyclomine (BENTYL) 10 MG capsule Take 10 mg by mouth 4 (four) times daily as needed for spasms.    . hydrochlorothiazide (HYDRODIURIL) 25 MG tablet Take 25 mg by mouth daily.    . IBU 800 MG tablet Take 800 mg by mouth 3 (three) times daily as needed.    . linaclotide (LINZESS) 145 MCG CAPS capsule Take 1 capsule (145 mcg  total) by mouth daily before breakfast. 30 capsule 3  . omega-3 acid ethyl esters (LOVAZA) 1 g capsule TAKE 2 CAPSULES BY MOUTH TWICE DAILY. 120 capsule 0  . Omega-3 Fatty Acids (FISH OIL) 1000 MG CAPS Take 1 capsule (1,000 mg total) by mouth daily. 90 capsule 1  . omeprazole (PRILOSEC) 40 MG capsule Take 1 capsule (40 mg total) by mouth daily. (Patient not taking: Reported on 09/18/2019) 90 capsule 3  . pantoprazole (PROTONIX) 40 MG tablet Take 1 tablet (40 mg total) by mouth 2 (two) times daily before a meal. 60 tablet 3  . polyethylene glycol powder (GLYCOLAX/MIRALAX) 17 GM/SCOOP powder Take 17 g by mouth daily. 850 g 1  . [DISCONTINUED] hyoscyamine (LEVSIN SL) 0.125 MG SL tablet Place 1 tablet (0.125 mg total) under the tongue every 6 (six) hours as needed (abd pain). 30 tablet 0   Social History   Socioeconomic History  . Marital status: Married    Spouse name: Not on file  . Number of children: 4  . Years of education: Not on file  . Highest education level: Not on file  Occupational History  . Occupation: Lawyer  . Occupation: waitress  Tobacco Use  . Smoking status: Never Smoker  . Smokeless tobacco: Never Used  Vaping Use  . Vaping Use: Never used  Substance and Sexual Activity  .  Alcohol use: Yes    Comment: "Once every 2 months"  . Drug use: No  . Sexual activity: Yes    Birth control/protection: Post-menopausal  Other Topics Concern  . Not on file  Social History Narrative  . Not on file   Social Determinants of Health   Financial Resource Strain:   . Difficulty of Paying Living Expenses:   Food Insecurity:   . Worried About Programme researcher, broadcasting/film/video in the Last Year:   . Barista in the Last Year:   Transportation Needs:   . Freight forwarder (Medical):   Marland Kitchen Lack of Transportation (Non-Medical):   Physical Activity:   . Days of Exercise per Week:   . Minutes of Exercise per Session:   Stress:   . Feeling of Stress :   Social Connections:    . Frequency of Communication with Friends and Family:   . Frequency of Social Gatherings with Friends and Family:   . Attends Religious Services:   . Active Member of Clubs or Organizations:   . Attends Banker Meetings:   Marland Kitchen Marital Status:   Intimate Partner Violence:   . Fear of Current or Ex-Partner:   . Emotionally Abused:   Marland Kitchen Physically Abused:   . Sexually Abused:    Family History  Problem Relation Age of Onset  . Lung cancer Mother   . Heart attack Father   . Diverticulitis Paternal Grandmother   . Diverticulitis Brother        died from septic shock  . Breast cancer Daughter 40  . COPD Brother   . Colon cancer Neg Hx     OBJECTIVE:  Vitals:   02/15/20 1105  BP: 127/86  Pulse: 80  Resp: 20  Temp: 98.7 F (37.1 C)  SpO2: 96%     General appearance: alert; appears mildly fatigued, but nontoxic; speaking in full sentences and tolerating own secretions HEENT: NCAT; Ears: EACs clear, TMs pearly gray; Eyes: PERRL.  EOM grossly intact. Sinuses: nontender; Nose: nares patent without rhinorrhea, Throat: oropharynx clear, tonsils non erythematous or enlarged, uvula midline  Neck: supple without LAD Lungs: unlabored respirations, symmetrical air entry; cough: absent; no respiratory distress; CTAB Heart: regular rate and rhythm.   Skin: warm and dry Psychological: alert and cooperative; normal mood and affect   ASSESSMENT & PLAN:  1. COVID-19 ruled out   2. Viral URI with cough     Meds ordered this encounter  Medications  . benzonatate (TESSALON) 100 MG capsule    Sig: Take 1 capsule (100 mg total) by mouth every 8 (eight) hours.    Dispense:  21 capsule    Refill:  0    Order Specific Question:   Supervising Provider    Answer:   Eustace Moore [[id number]]  . predniSONE (DELTASONE) 20 MG tablet    Sig: Take 1 tablet (20 mg total) by mouth 2 (two) times daily with a meal for 5 days.    Dispense:  10 tablet    Refill:  0    Order Specific  Question:   Supervising Provider    Answer:   Eustace Moore [[id number]]    COVID testing ordered.  It will take between 2-5  days for test results.  Someone will contact you regarding abnormal results.    In the meantime: You should remain isolated in your home for 10 days from symptom onset AND greater than 72 hours after symptoms resolution (absence of fever  without the use of fever-reducing medication and improvement in respiratory symptoms), whichever is longer Get plenty of rest and push fluids Prednisone prescribed for congestion and sinus pressure Tessalon Perles prescribed for cough Use OTC zyrtec for nasal congestion, runny nose, and/or sore throat Use OTC flonase for nasal congestion and runny nose Use medications daily for symptom relief Use OTC medications like ibuprofen or tylenol as needed fever or pain Call or go to the ED if you have any new or worsening symptoms such as fever, worsening cough, shortness of breath, chest tightness, chest pain, turning blue, changes in mental status, etc...   Reviewed expectations re: course of current medical issues. Questions answered. Outlined signs and symptoms indicating need for more acute intervention. Patient verbalized understanding. After Visit Summary given.         Lestine Box, PA-C 02/15/20 1121

## 2020-02-15 NOTE — Discharge Instructions (Signed)
COVID testing ordered.  It will take between 2-5  days for test results.  Someone will contact you regarding abnormal results.    In the meantime: You should remain isolated in your home for 10 days from symptom onset AND greater than 72 hours after symptoms resolution (absence of fever without the use of fever-reducing medication and improvement in respiratory symptoms), whichever is longer Get plenty of rest and push fluids Prednisone prescribed for congestion and sinus pressure Tessalon Perles prescribed for cough Use OTC zyrtec for nasal congestion, runny nose, and/or sore throat Use OTC flonase for nasal congestion and runny nose Use medications daily for symptom relief Use OTC medications like ibuprofen or tylenol as needed fever or pain Call or go to the ED if you have any new or worsening symptoms such as fever, worsening cough, shortness of breath, chest tightness, chest pain, turning blue, changes in mental status, etc..Marland Kitchen

## 2020-02-16 LAB — NOVEL CORONAVIRUS, NAA: SARS-CoV-2, NAA: NOT DETECTED

## 2020-02-16 LAB — SARS-COV-2, NAA 2 DAY TAT

## 2020-02-17 IMAGING — MG DIGITAL SCREENING BILATERAL MAMMOGRAM WITH TOMO AND CAD
7 series · 8 of 23 positions shown · non-contrast
Comparison: Previous exam(s).

CLINICAL DATA: Screening.

EXAM:
DIGITAL SCREENING BILATERAL MAMMOGRAM WITH TOMO AND CAD

[L CC synth-2D]
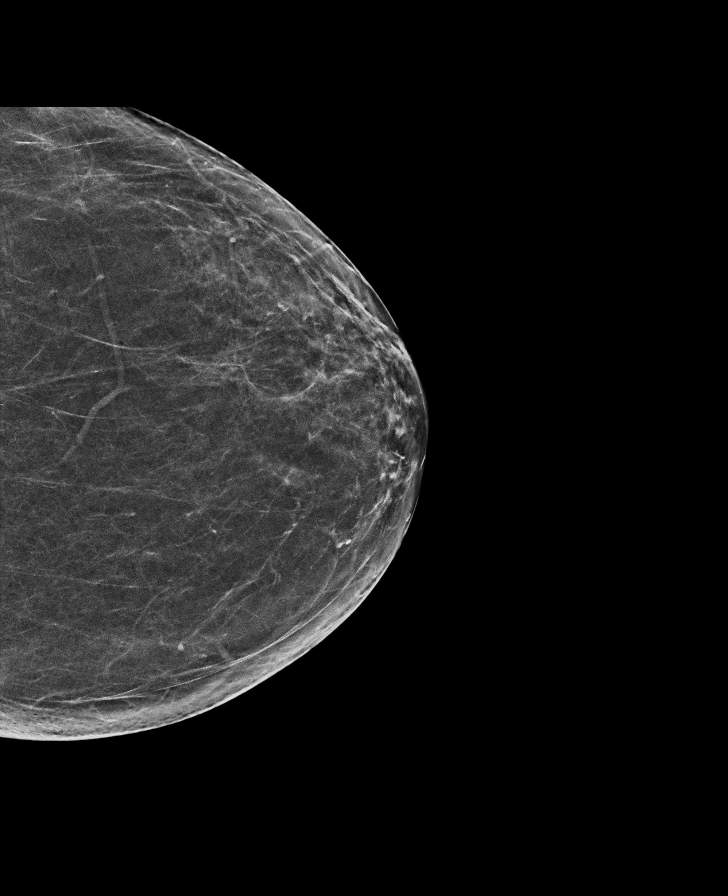

[R MLO synth-2D]
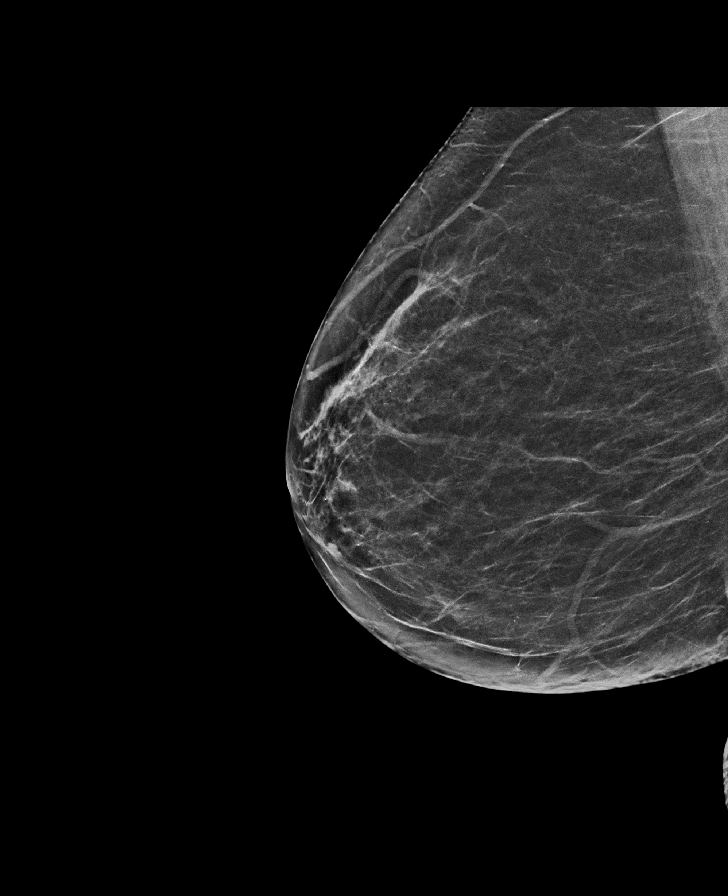

[L MLO synth-2D]
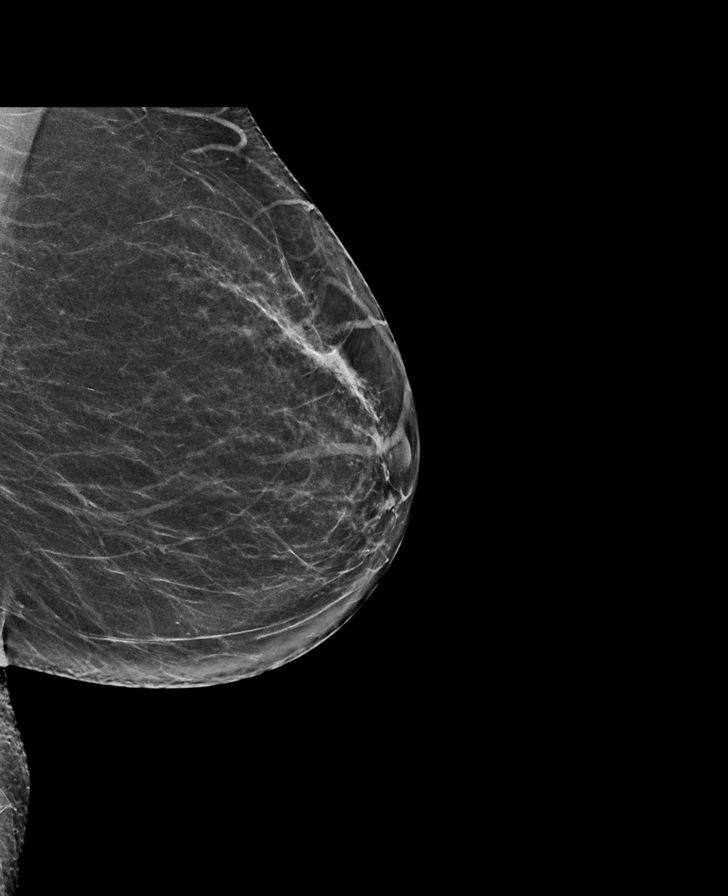

[L CC tomo · 2 of 64 frames shown]
[frame 21/64]
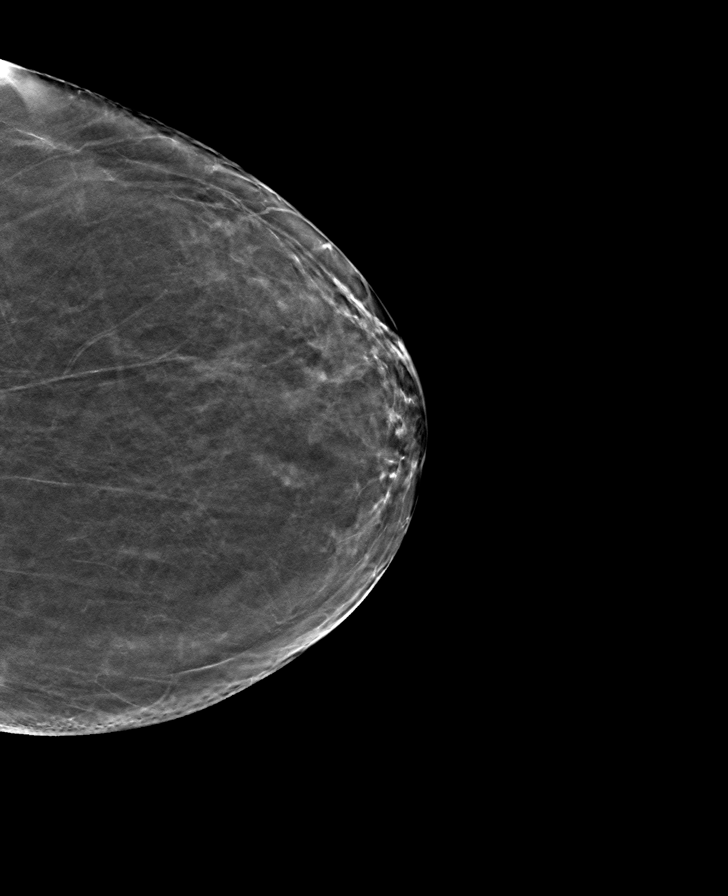
[frame 33/64]
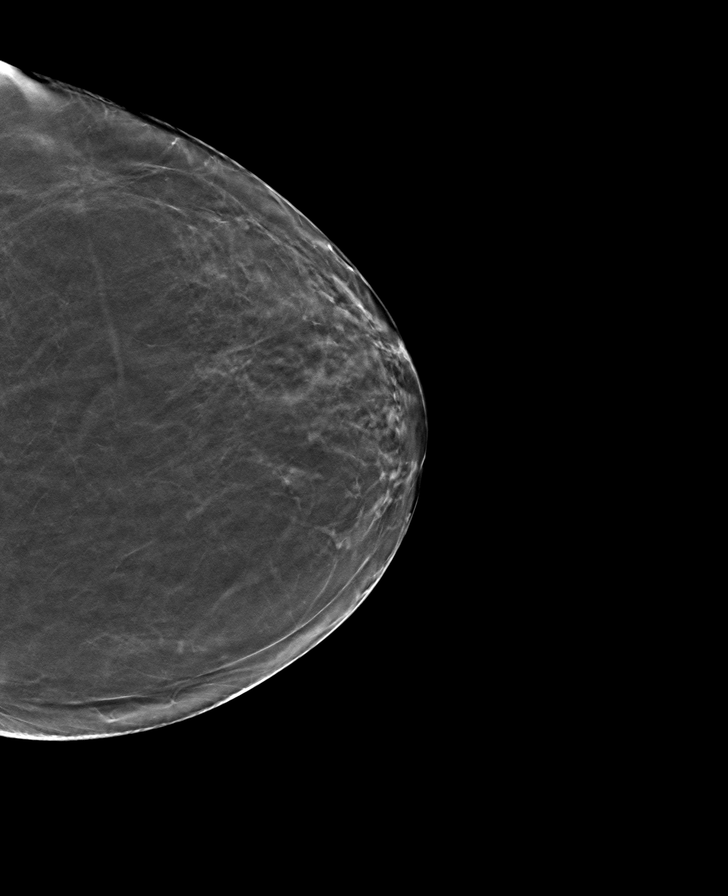

[L MLO tomo · tomo slice 34/67.0]
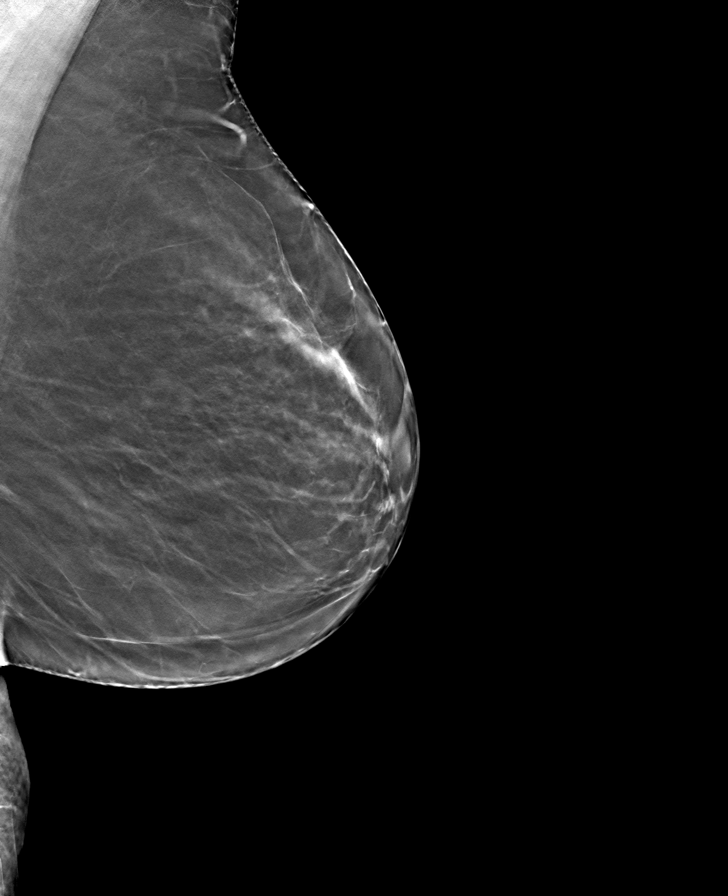

[R CC tomo · tomo slice 33/64.0]
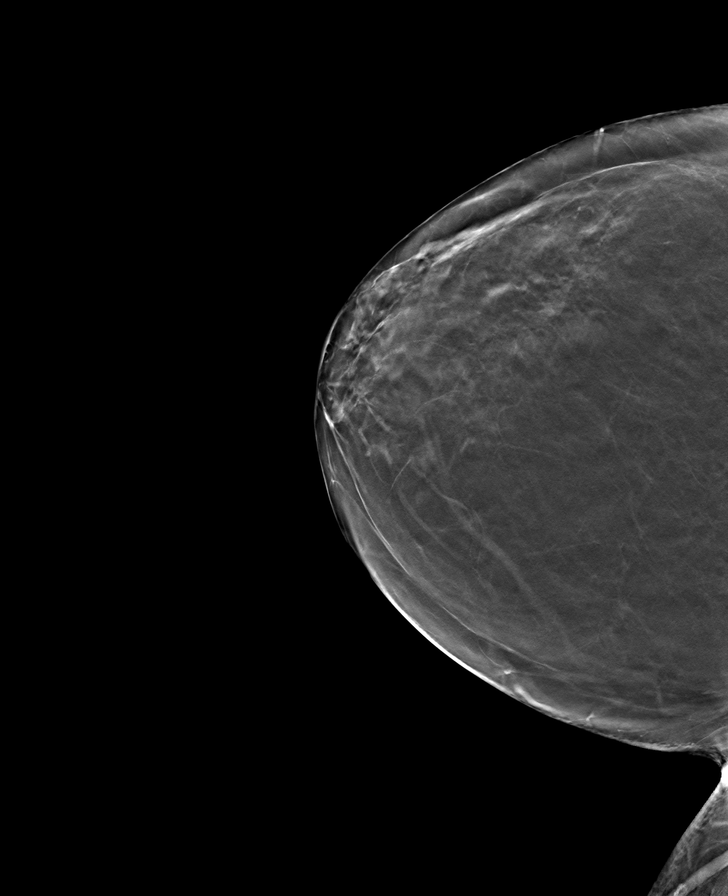

[R MLO tomo · tomo slice 35/68.0]
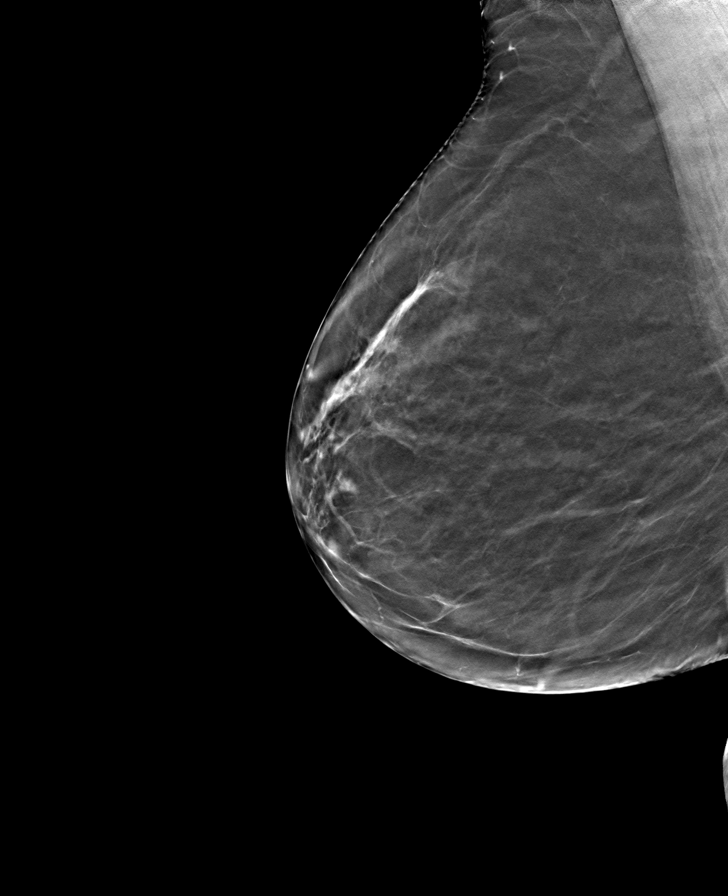

[8 of 23 positions shown; findings below may reference images not displayed]

ACR Breast Density Category b: There are scattered areas of
fibroglandular density.
FINDINGS: There are no findings suspicious for malignancy. Images were
processed with CAD.
IMPRESSION: No mammographic evidence of malignancy. A result letter of this
screening mammogram will be mailed directly to the patient.

RECOMMENDATION:
Screening mammogram in one year. (Code:[id number])

BI-RADS CATEGORY  1: Negative.

## 2020-02-18 ENCOUNTER — Other Ambulatory Visit: Payer: Self-pay

## 2020-02-18 ENCOUNTER — Ambulatory Visit (INDEPENDENT_AMBULATORY_CARE_PROVIDER_SITE_OTHER): Payer: 59 | Admitting: Bariatrics

## 2020-02-18 ENCOUNTER — Encounter (INDEPENDENT_AMBULATORY_CARE_PROVIDER_SITE_OTHER): Payer: Self-pay | Admitting: Bariatrics

## 2020-02-18 VITALS — BP 142/80 | HR 67 | Temp 98.6°F | Ht 64.0 in | Wt 175.0 lb

## 2020-02-18 DIAGNOSIS — Z0289 Encounter for other administrative examinations: Secondary | ICD-10-CM

## 2020-02-18 DIAGNOSIS — R0602 Shortness of breath: Secondary | ICD-10-CM | POA: Diagnosis not present

## 2020-02-18 DIAGNOSIS — R5383 Other fatigue: Secondary | ICD-10-CM | POA: Diagnosis not present

## 2020-02-18 DIAGNOSIS — I10 Essential (primary) hypertension: Secondary | ICD-10-CM

## 2020-02-18 DIAGNOSIS — K588 Other irritable bowel syndrome: Secondary | ICD-10-CM

## 2020-02-18 DIAGNOSIS — Z9189 Other specified personal risk factors, not elsewhere classified: Secondary | ICD-10-CM | POA: Diagnosis not present

## 2020-02-18 DIAGNOSIS — K219 Gastro-esophageal reflux disease without esophagitis: Secondary | ICD-10-CM

## 2020-02-18 DIAGNOSIS — Z1331 Encounter for screening for depression: Secondary | ICD-10-CM

## 2020-02-18 DIAGNOSIS — Z6830 Body mass index (BMI) 30.0-30.9, adult: Secondary | ICD-10-CM

## 2020-02-18 DIAGNOSIS — E781 Pure hyperglyceridemia: Secondary | ICD-10-CM

## 2020-02-18 DIAGNOSIS — E669 Obesity, unspecified: Secondary | ICD-10-CM

## 2020-02-19 ENCOUNTER — Ambulatory Visit: Payer: PRIVATE HEALTH INSURANCE | Admitting: Family Medicine

## 2020-02-19 ENCOUNTER — Encounter (INDEPENDENT_AMBULATORY_CARE_PROVIDER_SITE_OTHER): Payer: Self-pay | Admitting: Bariatrics

## 2020-02-19 LAB — COMPREHENSIVE METABOLIC PANEL
ALT: 12 IU/L (ref 0–32)
AST: 14 IU/L (ref 0–40)
Albumin/Globulin Ratio: 1.5 (ref 1.2–2.2)
Albumin: 4.4 g/dL (ref 3.8–4.8)
Alkaline Phosphatase: 103 IU/L (ref 48–121)
BUN/Creatinine Ratio: 35 — ABNORMAL HIGH (ref 12–28)
BUN: 19 mg/dL (ref 8–27)
Bilirubin Total: 0.6 mg/dL (ref 0.0–1.2)
CO2: 24 mmol/L (ref 20–29)
Calcium: 9.7 mg/dL (ref 8.7–10.3)
Chloride: 102 mmol/L (ref 96–106)
Creatinine, Ser: 0.55 mg/dL — ABNORMAL LOW (ref 0.57–1.00)
GFR calc Af Amer: 115 mL/min/{1.73_m2} (ref >59)
GFR calc non Af Amer: 100 mL/min/{1.73_m2} (ref >59)
Globulin, Total: 2.9 g/dL (ref 1.5–4.5)
Glucose: 86 mg/dL (ref 65–99)
Potassium: 4.4 mmol/L (ref 3.5–5.2)
Sodium: 140 mmol/L (ref 134–144)
Total Protein: 7.3 g/dL (ref 6.0–8.5)

## 2020-02-19 LAB — VITAMIN D 25 HYDROXY (VIT D DEFICIENCY, FRACTURES): Vit D, 25-Hydroxy: 32.2 ng/mL (ref 30.0–100.0)

## 2020-02-19 LAB — LIPID PANEL WITH LDL/HDL RATIO
Cholesterol, Total: 231 mg/dL — ABNORMAL HIGH (ref 100–199)
HDL: 62 mg/dL (ref >39)
LDL Chol Calc (NIH): 154 mg/dL — ABNORMAL HIGH (ref 0–99)
LDL/HDL Ratio: 2.5 ratio (ref 0.0–3.2)
Triglycerides: 87 mg/dL (ref 0–149)
VLDL Cholesterol Cal: 15 mg/dL (ref 5–40)

## 2020-02-19 LAB — TSH: TSH: 1.83 u[IU]/mL (ref 0.450–4.500)

## 2020-02-19 LAB — HEMOGLOBIN A1C
Est. average glucose Bld gHb Est-mCnc: 105 mg/dL
Hgb A1c MFr Bld: 5.3 % (ref 4.8–5.6)

## 2020-02-19 LAB — INSULIN, RANDOM: INSULIN: 11.4 u[IU]/mL (ref 2.6–24.9)

## 2020-02-19 LAB — T3: T3, Total: 103 ng/dL (ref 71–180)

## 2020-02-19 LAB — T4, FREE: Free T4: 0.93 ng/dL (ref 0.82–1.77)

## 2020-02-19 NOTE — Progress Notes (Signed)
Chief Complaint:   OBESITY Victoria Page (MR# [id number]) is a 64 y.o. female who presents for evaluation and treatment of obesity and related comorbidities. Current BMI is Body mass index is 30.04 kg/m.Victoria Page has been struggling with her weight for many years and has been unsuccessful in either losing weight, maintaining weight loss, or reaching her healthy weight goal.  Victoria Page is currently in the action stage of change and ready to dedicate time achieving and maintaining a healthier weight. Victoria Page is interested in becoming our patient and working on intensive lifestyle modifications including (but not limited to) diet and exercise for weight loss.  Victoria Page does like to [employer]. She craves ice cream, pizza, and pickles. She states that she is a "picky" eater.  Victoria Page's habits were reviewed today and are as follows: her desired weight loss is 25 lbs, she has been heavy most of her life, she started gaining weight after her last pregnancy in 1976, her heaviest weight ever was 220 pounds, she is a picky eater and doesn't like to eat healthier foods, she craves ice cream, pizza, and pickles, she snacks frequently in the evenings, she skips lunch frequently, she frequently makes poor food choices, she has binge eating behaviors and she struggles with emotional eating.  Depression Screen Victoria Page's Food and Mood (modified PHQ-9) score was 8.  Depression screen PHQ 2/9 02/18/2020  Decreased Interest 2  Down, Depressed, Hopeless 1  PHQ - 2 Score 3  Altered sleeping 1  Tired, decreased energy 2  Change in appetite 1  Feeling bad or failure about yourself  0  Trouble concentrating 0  Moving slowly or fidgety/restless 1  Suicidal thoughts 0  PHQ-9 Score 8  Difficult doing work/chores Somewhat difficult   Subjective:   Other fatigue. Victoria Page denies daytime somnolence and admits to waking up still tired. Patent has a history of symptoms of morning headache. Victoria Page  generally gets 7-8 hours of sleep per night, and states that she has generally restful sleep. Snoring is present. Apneic episodes are present. Epworth Sleepiness Score is 5.  SOB (shortness of breath) on exertion. Victoria Page notes increasing shortness of breath with certain activities and seems to be worsening over time with weight gain. She notes getting out of breath sooner with activity than she used to. This has not gotten worse recently. Victoria Page denies shortness of breath at rest or orthopnea.  Essential hypertension. Victoria Page is taking HCTZ. Blood pressure is reasonably well controlled.  BP Readings from Last 3 Encounters:  02/18/20 (!) 142/80  02/15/20 127/86  09/18/19 133/89   Lab Results  Component Value Date   CREATININE 0.80 09/18/2019   CREATININE 0.64 07/29/2019   Gastroesophageal reflux disease, unspecified whether esophagitis present. Victoria Page is taking omeprazole. Symptoms are controlled with medication.  Hypertriglyceridemia. Victoria Page was taking Levsin. Last triglyceride level was elevated at 200 on 07/29/2019.  Other irritable bowel syndrome. Victoria Page reports having both diarrhea and constipation. She has been taking Linzess for 1 week.  Depression screening. Victoria Page has a mildly positive depression screen with a PHQ-9 score of 8.  At risk for heart disease. Victoria Page is at a higher than average risk for cardiovascular disease due to hypertension and obesity.   Assessment/Plan:   Other fatigue. Victoria Page does feel that her weight is causing her energy to be lower than it should be. Fatigue may be related to obesity, depression or many other causes. Labs will be ordered, and in the meanwhile, Victoria Page will focus on self care including  making healthy food choices, increasing physical activity and focusing on stress reduction. EKG 12-Lead, Hemoglobin A1c, Insulin, random, VITAMIN D 25 Hydroxy (Vit-D Deficiency, Fractures), T3, T4, free, TSH testing ordered today.  SOB  (shortness of breath) on exertion. Victoria Page does not feel that she gets out of breath more easily that she used to when she exercises. Victoria Page's shortness of breath appears to be obesity related and exercise induced. She has agreed to work on weight loss and gradually increase exercise to treat her exercise induced shortness of breath. Will continue to monitor closely. VITAMIN D 25 Hydroxy (Vit-D Deficiency, Fractures), T3, T4, free, TSH ordered today.  Essential hypertension. Victoria Page is working on healthy weight loss and exercise to improve blood pressure control. We will watch for signs of hypotension as she continues her lifestyle modifications. She will continue her medication as directed. Comprehensive metabolic panel ordered today.  Gastroesophageal reflux disease, unspecified whether esophagitis present. Intensive lifestyle modifications are the first line treatment for this issue. We discussed several lifestyle modifications today and she will continue to work on diet, exercise and weight loss efforts. Orders and follow up as documented in patient record. Victoria Page will continue her medication as directed.  Counseling . If a person has gastroesophageal reflux disease (GERD), food and stomach acid move back up into the esophagus and cause symptoms or problems such as damage to the esophagus. . Anti-reflux measures include: raising the head of the bed, avoiding tight clothing or belts, avoiding eating late at night, not lying down shortly after mealtime, and achieving weight loss. . Avoid ASA, NSAID's, caffeine, alcohol, and tobacco.  . OTC Pepcid and/or Tums are often very helpful for as needed use.  Victoria Page Kitchen However, for persisting chronic or daily symptoms, stronger medications like Omeprazole may be needed. . You may need to avoid foods and drinks such as: ? Coffee and tea (with or without caffeine). ? Drinks that contain alcohol. ? Energy drinks and sports drinks. ? Bubbly (carbonated) drinks or  sodas. ? Chocolate and cocoa. ? Peppermint and mint flavorings. ? Garlic and onions. ? Horseradish. ? Spicy and acidic foods. These include peppers, chili powder, curry powder, vinegar, hot sauces, and BBQ sauce. ? Citrus fruit juices and citrus fruits, such as oranges, lemons, and limes. ? Tomato-based foods. These include red sauce, chili, salsa, and pizza with red sauce. ? Fried and fatty foods. These include donuts, french fries, potato chips, and high-fat dressings.  ? High-fat meats. These include hot dogs, rib eye steak, sausage, ham, and bacon.  Hypertriglyceridemia. Victoria Page will control with diet and exercise. Lipid Panel With LDL/HDL Ratio ordered today.  Other irritable bowel syndrome. Victoria Page will continue her medication as directed.  Depression screening. Victoria Page had a positive depression screening. Depression is commonly associated with obesity and often results in emotional eating behaviors. We will monitor this closely and work on CBT to help improve the non-hunger eating patterns. Referral to Psychology may be required if no improvement is seen as she continues in our clinic.  At risk for heart disease. Victoria Page was given approximately 15 minutes of coronary artery disease prevention counseling today. She is 64 y.o. female and has risk factors for heart disease including obesity. We discussed intensive lifestyle modifications today with an emphasis on specific weight loss instructions and strategies.   Repetitive spaced learning was employed today to elicit superior memory formation and behavioral change.  Class 1 obesity with serious comorbidity and body mass index (BMI) of 30.0 to 30.9 in adult, unspecified obesity  type.  Victoria Page is currently in the action stage of change and her goal is to continue with weight loss efforts. I recommend Victoria Page begin the structured treatment plan as follows:  She has agreed to the Category 3 Plan.  She will work on meal planning and  intentional eating.   We reviewed with the patient labs from 09/18/2019 including CMP, CBC, and glucose.   Exercise goals: All adults should avoid inactivity. Some physical activity is better than none, and adults who participate in any amount of physical activity gain some health benefits.   Behavioral modification strategies: increasing lean protein intake, decreasing simple carbohydrates, increasing vegetables, increasing water intake, decreasing eating out, no skipping meals, meal planning and cooking strategies and keeping healthy foods in the home.  She was informed of the importance of frequent follow-up visits to maximize her success with intensive lifestyle modifications for her multiple health conditions. She was informed we would discuss her lab results at her next visit unless there is a critical issue that needs to be addressed sooner. Victoria Page agreed to keep her next visit at the agreed upon time to discuss these results.  Objective:   Blood pressure (!) 142/80, pulse 67, temperature 98.6 F (37 C), height 5\' 4"  (1.626 m), weight 175 lb (79.4 kg), SpO2 97 %. Body mass index is 30.04 kg/m.  EKG: Sinus  Rhythm with a rate of 62 BPM. Low voltage in precordial leads may be related to body habitus. Otherwise normal.  Indirect Calorimeter completed today shows a VO2 of 277 and a REE of 1930.  Her calculated basal metabolic rate is thus her basal metabolic rate is better than expected.  General: Cooperative, alert, well developed, in no acute distress. HEENT: Conjunctivae and lids unremarkable. Cardiovascular: Regular rhythm.  Lungs: Normal work of breathing. Neurologic: No focal deficits.   Lab Results  Component Value Date   WBC 5.5 09/18/2019   HGB 15.4 09/18/2019   HCT 45.6 (H) 09/18/2019   MCV 89.8 09/18/2019   PLT 242 09/18/2019   No results found for: IRON, TIBC, FERRITIN  Attestation Statements:   Reviewed by clinician on day of visit: allergies, medications,  problem list, medical history, surgical history, family history, social history, and previous encounter notes.  09/20/2019, am acting as Fernanda Drum for Energy manager, DO   I have reviewed the above documentation for accuracy and completeness, and I agree with the above. [employer], DO

## 2020-03-03 ENCOUNTER — Encounter (INDEPENDENT_AMBULATORY_CARE_PROVIDER_SITE_OTHER): Payer: Self-pay | Admitting: Bariatrics

## 2020-03-03 ENCOUNTER — Ambulatory Visit (INDEPENDENT_AMBULATORY_CARE_PROVIDER_SITE_OTHER): Payer: 59 | Admitting: Bariatrics

## 2020-03-03 ENCOUNTER — Other Ambulatory Visit: Payer: Self-pay

## 2020-03-03 VITALS — BP 123/77 | HR 69 | Temp 98.2°F | Ht 64.0 in | Wt 171.0 lb

## 2020-03-03 DIAGNOSIS — Z9189 Other specified personal risk factors, not elsewhere classified: Secondary | ICD-10-CM

## 2020-03-03 DIAGNOSIS — E669 Obesity, unspecified: Secondary | ICD-10-CM | POA: Diagnosis not present

## 2020-03-03 DIAGNOSIS — E559 Vitamin D deficiency, unspecified: Secondary | ICD-10-CM | POA: Diagnosis not present

## 2020-03-03 DIAGNOSIS — I10 Essential (primary) hypertension: Secondary | ICD-10-CM | POA: Diagnosis not present

## 2020-03-03 DIAGNOSIS — Z6830 Body mass index (BMI) 30.0-30.9, adult: Secondary | ICD-10-CM

## 2020-03-03 NOTE — Progress Notes (Signed)
Chief Complaint:   OBESITY Victoria Page is here to discuss her progress with her obesity treatment plan along with follow-up of her obesity related diagnoses. Victoria Page is on the Category 3 Plan and states she is following her eating plan approximately 95% of the time. Victoria Page states she is walking 30-45 minutes 3-4 times per week.  Today's visit was #: 2 Starting weight: 175 lbs Starting date: 02/18/2020 Today's weight: 171 lbs Today's date: 03/03/2020 Total lbs lost to date: 4 Total lbs lost since last in-office visit: 4  Interim History: Victoria Page is down 4 lbs. She did struggle with eggs and protein at dinner. She has IBS and reports signs and symptoms may have worsened.  Subjective:   Vitamin D deficiency. Marthann is not on Vitamin D supplementation.    Ref. Range 02/18/2020 14:39  Vitamin D, 25-Hydroxy Latest Ref Range: 30.0 - 100.0 ng/mL 32.2   Essential hypertension. Victoria Page is taking HCTZ.  BP Readings from Last 3 Encounters:  03/03/20 123/77  02/18/20 (!) 142/80  02/15/20 127/86   Lab Results  Component Value Date   CREATININE 0.55 (L) 02/18/2020   CREATININE 0.80 09/18/2019   CREATININE 0.64 07/29/2019   At risk for activity intolerance. Victoria Page is at risk of exercise intolerance due to obesity.  Assessment/Plan:   Vitamin D deficiency. Low Vitamin D level contributes to fatigue and are associated with obesity, breast, and colon cancer. She was given a prescription for Vitamin D @50 ,000 IU every week #4 with 0 refills and will follow-up for routine testing of Vitamin D, at least 2-3 times per year to avoid over-replacement.  Essential hypertension. Victoria Page is working on healthy weight loss and exercise to improve blood pressure control. We will watch for signs of hypotension as she continues her lifestyle modifications. She will continue HCTZ as directed.  At risk for activity intolerance. Victoria Page was given approximately 15 minutes of exercise  intolerance counseling today. She is 64 y.o. female and has risk factors exercise intolerance including obesity. We discussed intensive lifestyle modifications today with an emphasis on specific weight loss instructions and strategies. 65 will slowly increase activity as tolerated.  Repetitive spaced learning was employed today to elicit superior memory formation and behavioral change.  Class 1 obesity with serious comorbidity and body mass index (BMI) of 30.0 to 30.9 in adult, unspecified obesity type - BMI greater than 30 at start.  Victoria Page is currently in the action stage of change. As such, her goal is to continue with weight loss efforts. She has agreed to the Category 3 Plan.   She will work on meal planning and intentional eating.   We reviewed with the patient labs from 02/18/2020 including CMP, lipids, and Vitamin D.  Protein substitutions for 2 oz of meat were given as well as goals for breakfast, lunch, and dinner.  Exercise goals: All adults should avoid inactivity. Some physical activity is better than none, and adults who participate in any amount of physical activity gain some health benefits.  Behavioral modification strategies: increasing lean protein intake, decreasing simple carbohydrates, increasing vegetables, increasing water intake, decreasing eating out, no skipping meals, meal planning and cooking strategies, keeping healthy foods in the home and planning for success.  Victoria Page has agreed to follow-up with our clinic in 2 weeks. She was informed of the importance of frequent follow-up visits to maximize her success with intensive lifestyle modifications for her multiple health conditions.   Objective:   Blood pressure 123/77, pulse 69, temperature 98.2  F (36.8 C), height 5\' 4"  (1.626 m), weight 171 lb (77.6 kg), SpO2 96 %. Body mass index is 29.35 kg/m.  General: Cooperative, alert, well developed, in no acute distress. HEENT: Conjunctivae and lids  unremarkable. Cardiovascular: Regular rhythm.  Lungs: Normal work of breathing. Neurologic: No focal deficits.   Lab Results  Component Value Date   CREATININE 0.55 (L) 02/18/2020   BUN 19 02/18/2020   NA 140 02/18/2020   K 4.4 02/18/2020   CL 102 02/18/2020   CO2 24 02/18/2020   Lab Results  Component Value Date   ALT 12 02/18/2020   AST 14 02/18/2020   ALKPHOS 103 02/18/2020   BILITOT 0.6 02/18/2020   Lab Results  Component Value Date   HGBA1C 5.3 02/18/2020   Lab Results  Component Value Date   INSULIN 11.4 02/18/2020   Lab Results  Component Value Date   TSH 1.830 02/18/2020   Lab Results  Component Value Date   CHOL 231 (H) 02/18/2020   HDL 62 02/18/2020   LDLCALC 154 (H) 02/18/2020   TRIG 87 02/18/2020   CHOLHDL 4.5 07/29/2019   Lab Results  Component Value Date   WBC 5.5 09/18/2019   HGB 15.4 09/18/2019   HCT 45.6 (H) 09/18/2019   MCV 89.8 09/18/2019   PLT 242 09/18/2019   No results found for: IRON, TIBC, FERRITIN  Attestation Statements:   Reviewed by clinician on day of visit: allergies, medications, problem list, medical history, surgical history, family history, social history, and previous encounter notes.  09/20/2019, am acting as Fernanda Drum for Energy manager, DO   I have reviewed the above documentation for accuracy and completeness, and I agree with the above. [employer], DO

## 2020-03-23 ENCOUNTER — Other Ambulatory Visit: Payer: Self-pay

## 2020-03-23 ENCOUNTER — Ambulatory Visit (INDEPENDENT_AMBULATORY_CARE_PROVIDER_SITE_OTHER): Payer: 59 | Admitting: Bariatrics

## 2020-03-23 ENCOUNTER — Encounter (INDEPENDENT_AMBULATORY_CARE_PROVIDER_SITE_OTHER): Payer: Self-pay | Admitting: Bariatrics

## 2020-03-23 VITALS — BP 136/81 | HR 68 | Temp 98.0°F | Ht 64.0 in | Wt 171.0 lb

## 2020-03-23 DIAGNOSIS — K219 Gastro-esophageal reflux disease without esophagitis: Secondary | ICD-10-CM

## 2020-03-23 DIAGNOSIS — K588 Other irritable bowel syndrome: Secondary | ICD-10-CM | POA: Diagnosis not present

## 2020-03-23 DIAGNOSIS — E669 Obesity, unspecified: Secondary | ICD-10-CM

## 2020-03-23 DIAGNOSIS — I10 Essential (primary) hypertension: Secondary | ICD-10-CM | POA: Diagnosis not present

## 2020-03-23 DIAGNOSIS — Z9189 Other specified personal risk factors, not elsewhere classified: Secondary | ICD-10-CM

## 2020-03-23 DIAGNOSIS — Z6830 Body mass index (BMI) 30.0-30.9, adult: Secondary | ICD-10-CM

## 2020-03-24 ENCOUNTER — Encounter (INDEPENDENT_AMBULATORY_CARE_PROVIDER_SITE_OTHER): Payer: Self-pay | Admitting: Bariatrics

## 2020-03-24 NOTE — Progress Notes (Signed)
Chief Complaint:   OBESITY Victoria Page is here to discuss her progress with her obesity treatment plan along with follow-up of her obesity related diagnoses. Victoria Page is on the Category 3 Plan and states she is following her eating plan approximately 95% of the time. Victoria Page states she is walking 30-45 minutes 3-4 times per week.  Today's visit was #: 3 Starting weight: 175 lbs Starting date: 02/18/2020 Today's weight: 171 lbs Today's date: 03/23/2020 Total lbs lost to date: 4 Total lbs lost since last in-office visit: 0  Interim History: Victoria Page's weight remains the same. She ate at a [employer] last night. She reports eating more fruit.  Subjective:   Gastroesophageal reflux disease, unspecified whether esophagitis present. Victoria Page is taking Prilosec.  Essential hypertension. Victoria Page is taking HCTZ.  BP Readings from Last 3 Encounters:  03/23/20 136/81  03/03/20 123/77  02/18/20 (!) 142/80   Lab Results  Component Value Date   CREATININE 0.55 (L) 02/18/2020   CREATININE 0.80 09/18/2019   CREATININE 0.64 07/29/2019   Other irritable bowel syndrome, (alternating). Victoria Page is taking Linzess.  At risk for heart disease. Victoria Page is at a higher than average risk for cardiovascular disease due to hypertension.   Assessment/Plan:   Gastroesophageal reflux disease, unspecified whether esophagitis present. Intensive lifestyle modifications are the first line treatment for this issue. We discussed several lifestyle modifications today and she will continue to work on diet, exercise and weight loss efforts. Orders and follow up as documented in patient record. Victoria Page will continue her medication as directed.   Counseling . If a person has gastroesophageal reflux disease (GERD), food and stomach acid move back up into the esophagus and cause symptoms or problems such as damage to the esophagus. . Anti-reflux measures include: raising the head of the bed,  avoiding tight clothing or belts, avoiding eating late at night, not lying down shortly after mealtime, and achieving weight loss. . Avoid ASA, NSAID's, caffeine, alcohol, and tobacco.  . OTC Pepcid and/or Tums are often very helpful for as needed use.  Victoria Page Kitchen However, for persisting chronic or daily symptoms, stronger medications like Omeprazole may be needed. . You may need to avoid foods and drinks such as: ? Coffee and tea (with or without caffeine). ? Drinks that contain alcohol. ? Energy drinks and sports drinks. ? Bubbly (carbonated) drinks or sodas. ? Chocolate and cocoa. ? Peppermint and mint flavorings. ? Garlic and onions. ? Horseradish. ? Spicy and acidic foods. These include peppers, chili powder, curry powder, vinegar, hot sauces, and BBQ sauce. ? Citrus fruit juices and citrus fruits, such as oranges, lemons, and limes. ? Tomato-based foods. These include red sauce, chili, salsa, and pizza with red sauce. ? Fried and fatty foods. These include donuts, french fries, potato chips, and high-fat dressings. ? High-fat meats. These include hot dogs, rib eye steak, sausage, ham, and bacon.  Essential hypertension. Victoria Page is working on healthy weight loss and exercise to improve blood pressure control. We will watch for signs of hypotension as she continues her lifestyle modifications. She will continue her medication as directed.   Other irritable bowel syndrome, (alternating). Victoria Page will use glycerin suppositories for retained stool, Miralax 3 times a week, and will increase her water intake.  At risk for heart disease. Victoria Page was given approximately 15 minutes of coronary artery disease prevention counseling today. She is 64 y.o. female and has risk factors for heart disease including obesity. We discussed intensive lifestyle modifications today with an emphasis  on specific weight loss instructions and strategies.   Repetitive spaced learning was employed today to elicit superior  memory formation and behavioral change.  BMI greater than 30 at start.  Victoria Page is currently in the action stage of change. As such, her goal is to continue with weight loss efforts. She has agreed to the Category 2 Plan + 100 calories.  She will work on meal planning and intentional eating.   Handout was provided on "[employer]."   Exercise goals: Victoria Page will continue walking 30-45 minutes 4 times per week.  Behavioral modification strategies: increasing lean protein intake, decreasing simple carbohydrates, increasing vegetables, increasing water intake, decreasing eating out, no skipping meals, meal planning and cooking strategies, keeping healthy foods in the home and planning for success.  Victoria Page has agreed to follow-up with our clinic in 2-3 weeks. She was informed of the importance of frequent follow-up visits to maximize her success with intensive lifestyle modifications for her multiple health conditions.   Objective:   Blood pressure 136/81, pulse 68, temperature 98 F (36.7 C), height 5\' 4"  (1.626 m), weight 171 lb (77.6 kg), SpO2 98 %. Body mass index is 29.35 kg/m.  General: Cooperative, alert, well developed, in no acute distress. HEENT: Conjunctivae and lids unremarkable. Cardiovascular: Regular rhythm.  Lungs: Normal work of breathing. Neurologic: No focal deficits.   Lab Results  Component Value Date   CREATININE 0.55 (L) 02/18/2020   BUN 19 02/18/2020   NA 140 02/18/2020   K 4.4 02/18/2020   CL 102 02/18/2020   CO2 24 02/18/2020   Lab Results  Component Value Date   ALT 12 02/18/2020   AST 14 02/18/2020   ALKPHOS 103 02/18/2020   BILITOT 0.6 02/18/2020   Lab Results  Component Value Date   HGBA1C 5.3 02/18/2020   Lab Results  Component Value Date   INSULIN 11.4 02/18/2020   Lab Results  Component Value Date   TSH 1.830 02/18/2020   Lab Results  Component Value Date   CHOL 231 (H) 02/18/2020   HDL 62 02/18/2020   LDLCALC 154 (H)  02/18/2020   TRIG 87 02/18/2020   CHOLHDL 4.5 07/29/2019   Lab Results  Component Value Date   WBC 5.5 09/18/2019   HGB 15.4 09/18/2019   HCT 45.6 (H) 09/18/2019   MCV 89.8 09/18/2019   PLT 242 09/18/2019   No results found for: IRON, TIBC, FERRITIN  Attestation Statements:   Reviewed by clinician on day of visit: allergies, medications, problem list, medical history, surgical history, family history, social history, and previous encounter notes.  09/20/2019, am acting as Fernanda Drum for Energy manager, DO   I have reviewed the above documentation for accuracy and completeness, and I agree with the above. [employer], DO

## 2020-04-13 ENCOUNTER — Ambulatory Visit (INDEPENDENT_AMBULATORY_CARE_PROVIDER_SITE_OTHER): Payer: 59 | Admitting: Bariatrics

## 2020-04-13 ENCOUNTER — Other Ambulatory Visit: Payer: Self-pay

## 2020-04-13 ENCOUNTER — Encounter (INDEPENDENT_AMBULATORY_CARE_PROVIDER_SITE_OTHER): Payer: Self-pay | Admitting: Bariatrics

## 2020-04-13 VITALS — BP 129/86 | HR 77 | Temp 98.0°F | Ht 64.0 in | Wt 167.0 lb

## 2020-04-13 DIAGNOSIS — Z6830 Body mass index (BMI) 30.0-30.9, adult: Secondary | ICD-10-CM | POA: Diagnosis not present

## 2020-04-13 DIAGNOSIS — E669 Obesity, unspecified: Secondary | ICD-10-CM | POA: Diagnosis not present

## 2020-04-13 DIAGNOSIS — E8881 Metabolic syndrome: Secondary | ICD-10-CM

## 2020-04-13 DIAGNOSIS — I10 Essential (primary) hypertension: Secondary | ICD-10-CM | POA: Diagnosis not present

## 2020-04-14 NOTE — Progress Notes (Signed)
Chief Complaint:   OBESITY Victoria Page is here to discuss her progress with her obesity treatment plan along with follow-up of her obesity related diagnoses. Victoria Page is on the Category 2 Plan + 100 calories and states she is following her eating plan approximately 95% of the time. Victoria Page states she is walking 30-45 minutes 2 times per week.  Today's visit was #: 4 Starting weight: 175 lbs Starting date: 02/18/2020 Today's weight: 167 lbs Today's date: 04/13/2020 Total lbs lost to date: 8 Total lbs lost since last in-office visit: 4  Interim History: Victoria Page is down 4 lbs. She states that she found out what she did wrong in the past (intermittent snacking).  Subjective:   Essential hypertension. Victoria Page is taking HCTZ. Blood pressure is controlled.  BP Readings from Last 3 Encounters:  04/13/20 129/86  03/23/20 136/81  03/03/20 123/77   Lab Results  Component Value Date   CREATININE 0.55 (L) 02/18/2020   CREATININE 0.80 09/18/2019   CREATININE 0.64 07/29/2019   Insulin resistance, mild. Victoria Page has a diagnosis of insulin resistance based on her elevated fasting insulin level >5. She continues to work on diet and exercise to decrease her risk of diabetes.  Lab Results  Component Value Date   INSULIN 11.4 02/18/2020   Lab Results  Component Value Date   HGBA1C 5.3 02/18/2020   Assessment/Plan:   Essential hypertension. Victoria Page is working on healthy weight loss and exercise to improve blood pressure control. We will watch for signs of hypotension as she continues her lifestyle modifications. She will continue HCTZ as directed.   Insulin resistance, mild. Victoria Page will continue to work on weight loss, exercise, increasing healthy fats and protein, and decreasing simple carbohydrates to help decrease the risk of diabetes. Victoria Page agreed to follow-up with [place] as directed to closely monitor her progress.  Class 1 obesity with serious comorbidity and body mass  index (BMI) of 30.0 to 30.9 in adult, unspecified obesity type - BMI greater than 30 at start.  Victoria Page is currently in the action stage of change. As such, her goal is to continue with weight loss efforts. She has agreed to the Category 2 Plan + 100 calories.   She will work on meal planning, intentional eating, and will stop snacking after eating everything on the plan.  Exercise goals: Victoria Page is walking for exercise and doing well.  Behavioral modification strategies: increasing lean protein intake, decreasing simple carbohydrates, increasing vegetables, increasing water intake, decreasing eating out, no skipping meals, meal planning and cooking strategies, keeping healthy foods in the home and planning for success.  Victoria Page has agreed to follow-up with our clinic in 2 weeks. She was informed of the importance of frequent follow-up visits to maximize her success with intensive lifestyle modifications for her multiple health conditions.   Objective:   Blood pressure 129/86, pulse 77, temperature 98 F (36.7 C), height 5\' 4"  (1.626 m), weight 167 lb (75.8 kg), SpO2 96 %. Body mass index is 28.67 kg/m.  General: Cooperative, alert, well developed, in no acute distress. HEENT: Conjunctivae and lids unremarkable. Cardiovascular: Regular rhythm.  Lungs: Normal work of breathing. Neurologic: No focal deficits.   Lab Results  Component Value Date   CREATININE 0.55 (L) 02/18/2020   BUN 19 02/18/2020   NA 140 02/18/2020   K 4.4 02/18/2020   CL 102 02/18/2020   CO2 24 02/18/2020   Lab Results  Component Value Date   ALT 12 02/18/2020   AST 14 02/18/2020  ALKPHOS 103 02/18/2020   BILITOT 0.6 02/18/2020   Lab Results  Component Value Date   HGBA1C 5.3 02/18/2020   Lab Results  Component Value Date   INSULIN 11.4 02/18/2020   Lab Results  Component Value Date   TSH 1.830 02/18/2020   Lab Results  Component Value Date   CHOL 231 (H) 02/18/2020   HDL 62 02/18/2020    LDLCALC 154 (H) 02/18/2020   TRIG 87 02/18/2020   CHOLHDL 4.5 07/29/2019   Lab Results  Component Value Date   WBC 5.5 09/18/2019   HGB 15.4 09/18/2019   HCT 45.6 (H) 09/18/2019   MCV 89.8 09/18/2019   PLT 242 09/18/2019   No results found for: IRON, TIBC, FERRITIN  Attestation Statements:   Reviewed by clinician on day of visit: allergies, medications, problem list, medical history, surgical history, family history, social history, and previous encounter notes.  Time spent on visit including pre-visit chart review and post-visit charting and care was 20 minutes.   Fernanda Drum, am acting as Energy manager for [employer], DO   I have reviewed the above documentation for accuracy and completeness, and I agree with the above. Corinna Capra, DO

## 2020-04-15 ENCOUNTER — Encounter (INDEPENDENT_AMBULATORY_CARE_PROVIDER_SITE_OTHER): Payer: Self-pay | Admitting: Bariatrics

## 2020-04-19 ENCOUNTER — Other Ambulatory Visit: Payer: Self-pay

## 2020-04-19 ENCOUNTER — Other Ambulatory Visit (INDEPENDENT_AMBULATORY_CARE_PROVIDER_SITE_OTHER): Payer: 59 | Admitting: *Deleted

## 2020-04-19 VITALS — BP 132/85 | HR 66 | Ht 64.0 in | Wt 172.0 lb

## 2020-04-19 DIAGNOSIS — R319 Hematuria, unspecified: Secondary | ICD-10-CM | POA: Diagnosis not present

## 2020-04-19 DIAGNOSIS — R300 Dysuria: Secondary | ICD-10-CM

## 2020-04-19 LAB — POCT URINALYSIS DIPSTICK
Glucose, UA: NEGATIVE
Ketones, UA: NEGATIVE
Leukocytes, UA: NEGATIVE
Nitrite, UA: NEGATIVE
Protein, UA: NEGATIVE

## 2020-04-19 MED ORDER — PHENAZOPYRIDINE HCL 200 MG PO TABS
200.0000 mg | ORAL_TABLET | Freq: Three times a day (TID) | ORAL | 0 refills | Status: DC | PRN
Start: 2020-04-19 — End: 2020-04-27

## 2020-04-19 MED ORDER — SULFAMETHOXAZOLE-TRIMETHOPRIM 800-160 MG PO TABS
1.0000 | ORAL_TABLET | Freq: Two times a day (BID) | ORAL | 0 refills | Status: DC
Start: 2020-04-19 — End: 2020-04-27

## 2020-04-19 NOTE — Progress Notes (Signed)
Chart reviewed for nurse visit. Agree with plan of care. Will rx septra ds 1 bid x 7 days and pyridium tid prn Adline Potter, NP 04/19/2020 2:02 PM

## 2020-04-19 NOTE — Progress Notes (Signed)
° °  NURSE VISIT- UTI SYMPTOMS   SUBJECTIVE:  Victoria Page is a 64 y.o. [id number] female here for UTI symptoms. She is a GYN patient. She reports dysuria, flank pain bilaterally, hematuria, lower abdominal pain, urinary frequency and urinary urgency.  OBJECTIVE:  BP 132/85 (BP Location: Left Arm, Patient Position: Sitting, Cuff Size: Normal)    Pulse 66    Ht 5\' 4"  (1.626 m)    Wt 172 lb (78 kg)    BMI 29.52 kg/m   Appears well, in no apparent distress  Results for orders placed or performed in visit on 04/19/20 (from the past 24 hour(s))  POCT Urinalysis Dipstick   Collection Time: 04/19/20  1:53 PM  Result Value Ref Range   Color, UA     Clarity, UA     Glucose, UA Negative Negative   Bilirubin, UA     Ketones, UA neg    Spec Grav, UA     Blood, UA small    pH, UA     Protein, UA Negative Negative   Urobilinogen, UA     Nitrite, UA neg    Leukocytes, UA Negative Negative   Appearance     Odor      ASSESSMENT: GYN patient with UTI symptoms and negative nitrites  PLAN: Discussed with 04/21/20, AGNP   Rx sent by provider today: Yes Urine culture sent Call or return to clinic prn if these symptoms worsen or fail to improve as anticipated. Follow-up: as needed   Cyril Mourning  04/19/2020 1:56 PM

## 2020-04-19 NOTE — Addendum Note (Signed)
Addended by: Cyril Mourning A on: 04/19/2020 02:03 PM   Modules accepted: Orders

## 2020-04-21 LAB — URINE CULTURE

## 2020-04-22 ENCOUNTER — Telehealth: Payer: Self-pay | Admitting: Adult Health

## 2020-04-22 NOTE — Telephone Encounter (Signed)
Victoria Page is taking septra ds, that should take care of the + [facility]

## 2020-04-27 ENCOUNTER — Ambulatory Visit (INDEPENDENT_AMBULATORY_CARE_PROVIDER_SITE_OTHER): Payer: 59 | Admitting: Bariatrics

## 2020-04-27 ENCOUNTER — Ambulatory Visit (INDEPENDENT_AMBULATORY_CARE_PROVIDER_SITE_OTHER): Payer: 59 | Admitting: Family Medicine

## 2020-04-27 ENCOUNTER — Other Ambulatory Visit: Payer: Self-pay

## 2020-04-27 ENCOUNTER — Encounter (INDEPENDENT_AMBULATORY_CARE_PROVIDER_SITE_OTHER): Payer: Self-pay | Admitting: Family Medicine

## 2020-04-27 VITALS — BP 124/84 | HR 71 | Temp 98.2°F | Ht 64.0 in | Wt 165.0 lb

## 2020-04-27 DIAGNOSIS — Z6830 Body mass index (BMI) 30.0-30.9, adult: Secondary | ICD-10-CM | POA: Diagnosis not present

## 2020-04-27 DIAGNOSIS — E8881 Metabolic syndrome: Secondary | ICD-10-CM

## 2020-04-27 DIAGNOSIS — E7849 Other hyperlipidemia: Secondary | ICD-10-CM

## 2020-04-27 DIAGNOSIS — E669 Obesity, unspecified: Secondary | ICD-10-CM

## 2020-04-27 NOTE — Progress Notes (Signed)
Chief Complaint:   OBESITY Victoria Page is here to discuss her progress with her obesity treatment plan along with follow-up of her obesity related diagnoses. Victoria Page is on the Category 2 Plan + 100 calories and states she is following her eating plan approximately 90% of the time. Victoria Page states she is walking for 45 minutes 4 times per week.  Today's visit was #: 5 Starting weight: 175 lbs Starting date: 02/18/2020 Today's weight: 165 lbs Today's date: 04/27/2020 Total lbs lost to date: 10 Total lbs lost since last in-office visit: 2  Interim History: Victoria Page voices the last few weeks has been relatively easy with the exception for the drive to eat ice cream and oatmeal. She voices she is looking for an alternative to eggs. She is feeling full, and not able to get in all the protein at dinner. She is doing apple, peanut butter, and celery for snacks.  Subjective:   1. Other hyperlipidemia Victoria Page's last LDL was 154, HDL 62, and triglycerides 87. She is not on statin.  2. Insulin resistance Victoria Page's last A1c was 5.3 and insulin 11.4. She is not on medications, and she notes minimal carbohydrate cravings.  Assessment/Plan:   1. Other hyperlipidemia Cardiovascular risk and specific lipid/LDL goals reviewed. We discussed several lifestyle modifications today. Victoria Page will continue to work on diet, exercise and weight loss efforts. We will repeat labs in early October. Orders and follow up as documented in patient record.   Counseling Intensive lifestyle modifications are the first line treatment for this issue.  Dietary changes: Increase soluble fiber. Decrease simple carbohydrates.  Exercise changes: Moderate to vigorous-intensity aerobic activity 150 minutes per week if tolerated.  Lipid-lowering medications: see documented in medical record.  2. Insulin resistance Victoria Page will continue her Category 2 plan, and will continue to work on weight loss, exercise, and  decreasing simple carbohydrates to help decrease the risk of diabetes. We will repeat labs in 2 months. Victoria Page agreed to follow-up with [place] as directed to closely monitor her progress.  3. Class 1 obesity with serious comorbidity and body mass index (BMI) of 30.0 to 30.9 in adult, unspecified obesity type Victoria Page is currently in the action stage of change. As such, her goal is to continue with weight loss efforts. She has agreed to the Category 2 Plan + 100 calories with oatmeal option at breakfast.   Exercise goals: All adults should avoid inactivity. Some physical activity is better than none, and adults who participate in any amount of physical activity gain some health benefits.  Behavioral modification strategies: increasing lean protein intake, increasing vegetables, meal planning and cooking strategies, keeping healthy foods in the home and planning for success.  Victoria Page has agreed to follow-up with our clinic in 2 weeks. She was informed of the importance of frequent follow-up visits to maximize her success with intensive lifestyle modifications for her multiple health conditions.   Objective:   Blood pressure 124/84, pulse 71, temperature 98.2 F (36.8 C), temperature source Oral, height 5\' 4"  (1.626 m), weight 165 lb (74.8 kg), SpO2 98 %. Body mass index is 28.32 kg/m.  General: Cooperative, alert, well developed, in no acute distress. HEENT: Conjunctivae and lids unremarkable. Cardiovascular: Regular rhythm.  Lungs: Normal work of breathing. Neurologic: No focal deficits.   Lab Results  Component Value Date   CREATININE 0.55 (L) 02/18/2020   BUN 19 02/18/2020   NA 140 02/18/2020   K 4.4 02/18/2020   CL 102 02/18/2020   CO2 24 02/18/2020  Lab Results  Component Value Date   ALT 12 02/18/2020   AST 14 02/18/2020   ALKPHOS 103 02/18/2020   BILITOT 0.6 02/18/2020   Lab Results  Component Value Date   HGBA1C 5.3 02/18/2020   Lab Results  Component Value Date    INSULIN 11.4 02/18/2020   Lab Results  Component Value Date   TSH 1.830 02/18/2020   Lab Results  Component Value Date   CHOL 231 (H) 02/18/2020   HDL 62 02/18/2020   LDLCALC 154 (H) 02/18/2020   TRIG 87 02/18/2020   CHOLHDL 4.5 07/29/2019   Lab Results  Component Value Date   WBC 5.5 09/18/2019   HGB 15.4 09/18/2019   HCT 45.6 (H) 09/18/2019   MCV 89.8 09/18/2019   PLT 242 09/18/2019   No results found for: IRON, TIBC, FERRITIN  Attestation Statements:   Reviewed by clinician on day of visit: allergies, medications, problem list, medical history, surgical history, family history, social history, and previous encounter notes.  Time spent on visit including pre-visit chart review and post-visit care and charting was 15 minutes.    I, Burt Knack, am acting as transcriptionist for Reuben Likes, MD.  I have reviewed the above documentation for accuracy and completeness, and I agree with the above. - Katherina Mires, MD

## 2020-04-28 ENCOUNTER — Telehealth: Payer: Self-pay | Admitting: Adult Health

## 2020-04-28 NOTE — Telephone Encounter (Signed)
Telephoned patient at home number. Patient states finished all but 3 days of the antibiotic because it made her sick. Patient not experiencing any UTI symptoms currently. Patient will call back and schedule if symptoms return.

## 2020-04-28 NOTE — Telephone Encounter (Signed)
Patient called stating that she came in and left a Urine and it states that she had a UTI, the medication that got prescribed to her she can not take and would like to know if there is another that can be sent to [facility] pharmacy. Please contact pt

## 2020-05-11 ENCOUNTER — Other Ambulatory Visit: Payer: Self-pay

## 2020-05-11 ENCOUNTER — Encounter (INDEPENDENT_AMBULATORY_CARE_PROVIDER_SITE_OTHER): Payer: Self-pay | Admitting: Bariatrics

## 2020-05-11 ENCOUNTER — Other Ambulatory Visit (INDEPENDENT_AMBULATORY_CARE_PROVIDER_SITE_OTHER): Payer: Self-pay | Admitting: Gastroenterology

## 2020-05-11 ENCOUNTER — Ambulatory Visit (INDEPENDENT_AMBULATORY_CARE_PROVIDER_SITE_OTHER): Payer: 59 | Admitting: Bariatrics

## 2020-05-11 VITALS — BP 138/86 | HR 69 | Temp 98.0°F | Ht 64.0 in | Wt 166.0 lb

## 2020-05-11 DIAGNOSIS — Z6830 Body mass index (BMI) 30.0-30.9, adult: Secondary | ICD-10-CM

## 2020-05-11 DIAGNOSIS — I10 Essential (primary) hypertension: Secondary | ICD-10-CM

## 2020-05-11 DIAGNOSIS — Z9189 Other specified personal risk factors, not elsewhere classified: Secondary | ICD-10-CM

## 2020-05-11 DIAGNOSIS — E559 Vitamin D deficiency, unspecified: Secondary | ICD-10-CM | POA: Diagnosis not present

## 2020-05-11 DIAGNOSIS — E669 Obesity, unspecified: Secondary | ICD-10-CM | POA: Diagnosis not present

## 2020-05-12 ENCOUNTER — Encounter: Payer: Self-pay | Admitting: Gastroenterology

## 2020-05-12 ENCOUNTER — Telehealth: Payer: Self-pay | Admitting: Gastroenterology

## 2020-05-12 NOTE — Progress Notes (Signed)
Chief Complaint:   Victoria Page is here to discuss her progress with her Victoria treatment plan along with follow-up of her Victoria related diagnoses. Victoria Page is on the Category 2 Plan + 100 calories and states she is following her eating plan approximately 90% of the time. Victoria Page states she is walking 30-45 minutes 2 times per week.  Today's visit was #: 6 Starting weight: 175 lbs Starting date: 02/18/2020 Today's weight: 166 lbs Today's date: 05/11/2020 Total lbs lost to date: 9 Total lbs lost since last in-office visit: 0  Interim History: Victoria Page is up about 1 lb. She states she is drinking diet soda.  Subjective:   Essential hypertension. [name] is taking HCTZ. Blood pressure is controlled.  BP Readings from Last 3 Encounters:  05/11/20 138/86  04/27/20 124/84  04/19/20 132/85   Lab Results  Component Value Date   CREATININE 0.55 (L) 02/18/2020   CREATININE 0.80 09/18/2019   CREATININE 0.64 07/29/2019   Vitamin D deficiency. Victoria Page is taking OTC Vitamin D3 2,000 units.   Ref. Range 02/18/2020 14:39  Vitamin D, 25-Hydroxy Latest Ref Range: 30.0 - 100.0 ng/mL 32.2   At risk for constipation. Victoria Page is at increased risk for constipation due to IBS. Victoria Page denies hard, infrequent stools currently.   Assessment/Plan:   Essential hypertension. Ja is working on healthy weight loss and exercise to improve blood pressure control. We will watch for signs of hypotension as she continues her lifestyle modifications. She will continue her medication as directed.   Vitamin D deficiency. Low Vitamin D level contributes to fatigue and are associated with Victoria, breast, and colon cancer. She was given a prescription for Vitamin D, Ergocalciferol, (DRISDOL) 1.25 MG (50000 UNIT) CAPS capsule every week #4 with 0 refills and will follow-up for routine testing of Vitamin D, at least 2-3 times per year to avoid over-replacement.   At risk for  constipation. Victoria Page was given approximately 15 minutes of counseling today regarding prevention of constipation. She was encouraged to increase water and fiber intake. Victoria Page will use Smooth Move Tea and Linzess as needed.  Class 1 Victoria with serious comorbidity and body mass index (BMI) of 30.0 to 30.9 in adult, unspecified Victoria type.  [name] is currently in the action stage of change. As such, her goal is to continue with weight loss efforts. She has agreed to the Category 2 Plan + 100 calories.   She will work on meal planning, intentional eating, and increasing her water intake.   Handout was provided on Smart Fruit Choices.  Exercise goals: All adults should avoid inactivity. Some physical activity is better than none, and adults who participate in any amount of physical activity gain some health benefits.  Behavioral modification strategies: increasing lean protein intake, decreasing simple carbohydrates, increasing vegetables, increasing water intake, decreasing eating out, no skipping meals, meal planning and cooking strategies, keeping healthy foods in the home and planning for success.  Victoria Page has agreed to follow-up with our clinic in 2-3 weeks. She was informed of the importance of frequent follow-up visits to maximize her success with intensive lifestyle modifications for her multiple health conditions.   Objective:   Blood pressure 138/86, pulse 69, temperature 98 F (36.7 C), height 5\' 4"  (1.626 m), weight 166 lb (75.3 kg), SpO2 94 %. Body mass index is 28.49 kg/m.  General: Cooperative, alert, well developed, in no acute distress. HEENT: Conjunctivae and lids unremarkable. Cardiovascular: Regular rhythm.  Lungs: Normal work of breathing. Neurologic: No  focal deficits.   Lab Results  Component Value Date   CREATININE 0.55 (L) 02/18/2020   BUN 19 02/18/2020   NA 140 02/18/2020   K 4.4 02/18/2020   CL 102 02/18/2020   CO2 24 02/18/2020   Lab Results    Component Value Date   ALT 12 02/18/2020   AST 14 02/18/2020   ALKPHOS 103 02/18/2020   BILITOT 0.6 02/18/2020   Lab Results  Component Value Date   HGBA1C 5.3 02/18/2020   Lab Results  Component Value Date   INSULIN 11.4 02/18/2020   Lab Results  Component Value Date   TSH 1.830 02/18/2020   Lab Results  Component Value Date   CHOL 231 (H) 02/18/2020   HDL 62 02/18/2020   LDLCALC 154 (H) 02/18/2020   TRIG 87 02/18/2020   CHOLHDL 4.5 07/29/2019   Lab Results  Component Value Date   WBC 5.5 09/18/2019   HGB 15.4 09/18/2019   HCT 45.6 (H) 09/18/2019   MCV 89.8 09/18/2019   PLT 242 09/18/2019   No results found for: IRON, TIBC, FERRITIN  Attestation Statements:   Reviewed by clinician on day of visit: allergies, medications, problem list, medical history, surgical history, family history, social history, and previous encounter notes.  Fernanda Drum, am acting as Energy manager for [employer], DO   I have reviewed the above documentation for accuracy and completeness, and I agree with the above. Corinna Capra, DO

## 2020-05-12 NOTE — Telephone Encounter (Signed)
Hey Dr. Adela Lank, patient is a previous Dr. Juanda Chance patient but has seen another Gi in the past year, states she had to switch GI doctors because of her insurance. She now has insurance again that we take and wants to come back to our office. You are the DOD from this afternoon, please advise if you will accept patient. Thank you!

## 2020-05-12 NOTE — Telephone Encounter (Signed)
Patient is scheduled for 07/13/2020.

## 2020-05-12 NOTE — Telephone Encounter (Signed)
I'm happy to see her, that is fine, thanks

## 2020-05-13 MED ORDER — VITAMIN D (ERGOCALCIFEROL) 1.25 MG (50000 UNIT) PO CAPS: 50000.0000 [IU] | ORAL_CAPSULE | ORAL | 0 refills | Status: DC

## 2020-05-17 ENCOUNTER — Other Ambulatory Visit (HOSPITAL_COMMUNITY)
Admission: RE | Admit: 2020-05-17 | Discharge: 2020-05-17 | Disposition: A | Discharge status: Home / Self Care | Payer: 59 | Source: Ambulatory Visit | Attending: Adult Health | Admitting: Adult Health

## 2020-05-17 ENCOUNTER — Encounter: Payer: Self-pay | Admitting: Adult Health

## 2020-05-17 ENCOUNTER — Ambulatory Visit (INDEPENDENT_AMBULATORY_CARE_PROVIDER_SITE_OTHER): Payer: 59 | Admitting: Adult Health

## 2020-05-17 ENCOUNTER — Other Ambulatory Visit: Payer: Self-pay

## 2020-05-17 VITALS — BP 119/76 | HR 68 | Ht 64.5 in | Wt 170.0 lb

## 2020-05-17 DIAGNOSIS — Z01419 Encounter for gynecological examination (general) (routine) without abnormal findings: Secondary | ICD-10-CM | POA: Insufficient documentation

## 2020-05-17 DIAGNOSIS — Z87440 Personal history of urinary (tract) infections: Secondary | ICD-10-CM | POA: Diagnosis not present

## 2020-05-17 DIAGNOSIS — Z1211 Encounter for screening for malignant neoplasm of colon: Secondary | ICD-10-CM

## 2020-05-17 LAB — HEMOCCULT GUIAC POC 1CARD (OFFICE): Fecal Occult Blood, POC: NEGATIVE

## 2020-05-17 LAB — POCT URINALYSIS DIPSTICK
Blood, UA: NEGATIVE
Glucose, UA: NEGATIVE
Ketones, UA: NEGATIVE
Leukocytes, UA: NEGATIVE
Nitrite, UA: NEGATIVE
Protein, UA: NEGATIVE

## 2020-05-17 NOTE — Progress Notes (Addendum)
Patient ID: Bosie Helper, female   DOB: 1955/10/11, 64 y.o.   MRN: [id number]  History of Present Illness: Denai is a 64 year old white female,divorced, PM in for well woman gyn exam and pap. Has lost 10 lbs going to weight loss center. PCP is [facility].   Current Medications, Allergies, Past Medical History, Past Surgical History, Family History and Social History were reviewed in [employer] record.     Review of Systems: Patient denies any headaches, hearing loss, fatigue, blurred vision, shortness of breath, chest pain, abdominal pain, problems with bowel movements(has IBS, sees Anna), urination, or intercourse. No joint pain or mood swings.    Physical Exam:BP 119/76 (BP Location: Left Arm, Patient Position: Sitting, Cuff Size: Normal)    Pulse 68    Ht 5' 4.5" (1.638 m)    Wt 170 lb (77.1 kg)    BMI 28.73 kg/m  urine is negative. General:  Well developed, well nourished, no acute distress Skin:  Warm and dry Neck:  Midline trachea, normal thyroid, good ROM, no lymphadenopathy.no carotid bruits heard Lungs; Clear to auscultation bilaterally Breast:  No dominant palpable mass, retraction, or nipple discharge Cardiovascular: Regular rate and rhythm Abdomen:  Soft, non tender, no hepatosplenomegaly Pelvic:  External genitalia is normal in appearance, no lesions.  The vagina is pale with loss of moisture and rugae. Urethra has no lesions or masses. The cervix is smooth, pap with high risk HPV genotyping performed. Uterus is felt to be normal size, shape, and contour.  No adnexal masses or tenderness noted.Bladder is non tender, no masses felt. Rectal: Good sphincter tone, no polyps, or hemorrhoids felt.  Hemoccult negative. Extremities/musculoskeletal:  No swelling or varicosities noted, no clubbing or cyanosis Psych:  No mood changes, alert and cooperative,seems happy AA is 2 Fall risk is low PHQ 9 score is 3, no SI   [employer] - 05/17/20 1941        Pregnancy Intention Screening   Does the patient want to become pregnant in the next year? No    Does the patient's partner want to become pregnant in the next year? No    Would the patient like to discuss contraceptive options today? No      Contraception Wrap Up   Current Method No Method - Other Reason   pM   End Method No Method - Other Reason   PM   Contraception Counseling Provided No         Examination chaperoned by Malachy Mood LPN.   Impression and Plan:  1. Encounter for gynecological examination with Papanicolaou smear of cervix Pap sent Physical yearly with PCP Pap in 3 years if normal Mammogram yearly Colonoscopy per GI Labs with PCP  2. History of UTI   3. Encounter for screening fecal occult blood testing

## 2020-05-20 LAB — CYTOLOGY - PAP
Comment: NEGATIVE
Diagnosis: NEGATIVE
Diagnosis: REACTIVE
High risk HPV: NEGATIVE

## 2020-05-26 ENCOUNTER — Encounter (INDEPENDENT_AMBULATORY_CARE_PROVIDER_SITE_OTHER): Payer: Self-pay | Admitting: Bariatrics

## 2020-05-26 ENCOUNTER — Other Ambulatory Visit: Payer: Self-pay

## 2020-05-26 ENCOUNTER — Ambulatory Visit (INDEPENDENT_AMBULATORY_CARE_PROVIDER_SITE_OTHER): Payer: 59 | Admitting: Bariatrics

## 2020-05-26 VITALS — BP 118/76 | HR 65 | Temp 97.9°F | Ht 64.0 in | Wt 164.0 lb

## 2020-05-26 DIAGNOSIS — Z9189 Other specified personal risk factors, not elsewhere classified: Secondary | ICD-10-CM

## 2020-05-26 DIAGNOSIS — E669 Obesity, unspecified: Secondary | ICD-10-CM

## 2020-05-26 DIAGNOSIS — Z6830 Body mass index (BMI) 30.0-30.9, adult: Secondary | ICD-10-CM | POA: Diagnosis not present

## 2020-05-26 DIAGNOSIS — I10 Essential (primary) hypertension: Secondary | ICD-10-CM

## 2020-05-26 DIAGNOSIS — E559 Vitamin D deficiency, unspecified: Secondary | ICD-10-CM

## 2020-05-26 MED ORDER — VITAMIN D (ERGOCALCIFEROL) 1.25 MG (50000 UNIT) PO CAPS: 50000.0000 [IU] | ORAL_CAPSULE | ORAL | 0 refills | Status: DC

## 2020-05-27 NOTE — Progress Notes (Signed)
Chief Complaint:   OBESITY Victoria Page is here to discuss her progress with her obesity treatment plan along with follow-up of her obesity related diagnoses. Victoria Page is on the Category 2 Plan and states she is following her eating plan approximately 90% of the time. Victoria Page states she is walking 45 minutes 2 times per week.  Today's visit was #: 7 Starting weight: 175 lbs Starting date: 02/18/2020 Today's weight: 164 lbs Today's date: 05/26/2020 Total lbs lost to date: 11 Total lbs lost since last in-office visit: 2  Interim History: Victoria is down 2 lbs since her last visit and doing well overall. She reports doing well with her water and protein intake.  Subjective:   Essential hypertension. Blood pressure is controlled.  BP Readings from Last 3 Encounters:  05/26/20 118/76  05/17/20 119/76  05/11/20 138/86   Lab Results  Component Value Date   CREATININE 0.55 (L) 02/18/2020   CREATININE 0.80 09/18/2019   CREATININE 0.64 07/29/2019   Vitamin D deficiency. No nausea, vomiting, or muscle weakness.    Ref. Range 02/18/2020 14:39  Vitamin D, 25-Hydroxy Latest Ref Range: 30.0 - 100.0 ng/mL 32.2   At risk for osteoporosis. Victoria Page is at higher risk of osteopenia and osteoporosis due to Vitamin D deficiency.   Assessment/Plan:   Essential hypertension. Victoria Page is working on healthy weight loss and exercise to improve blood pressure control. We will watch for signs of hypotension as she continues her lifestyle modifications. She will continue HCTZ as directed.   Vitamin D deficiency. Low Vitamin D level contributes to fatigue and are associated with obesity, breast, and colon cancer. She was given a prescription for Vitamin D, Ergocalciferol, (DRISDOL) 1.25 MG (50000 UNIT) CAPS capsule every week #4 with 0 refills and will follow-up for routine testing of Vitamin D, at least 2-3 times per year to avoid over-replacement.   At risk for osteoporosis. Victoria Page was given  approximately 15 minutes of osteoporosis prevention counseling today. Victoria Page is at risk for osteopenia and osteoporosis due to her Vitamin D deficiency. She was encouraged to take her Vitamin D and follow her higher calcium diet and increase strengthening exercise to help strengthen her bones and decrease her risk of osteopenia and osteoporosis.  Repetitive spaced learning was employed today to elicit superior memory formation and behavioral change.  Class 1 obesity with serious comorbidity and body mass index (BMI) of 30.0 to 30.9 in adult, unspecified obesity type - BMI greater than 30 at the start of the program.  Victoria Page is currently in the action stage of change. As such, her goal is to continue with weight loss efforts. She has agreed to the Category 2 Plan.   She will work on meal planning and intentional eating.   Exercise goals: All adults should avoid inactivity. Some physical activity is better than none, and adults who participate in any amount of physical activity gain some health benefits.  Behavioral modification strategies: increasing lean protein intake, decreasing simple carbohydrates, increasing vegetables, increasing water intake, decreasing eating out, no skipping meals, meal planning and cooking strategies, keeping healthy foods in the home and planning for success.  Victoria Page has agreed to follow-up with our clinic fasting in 3 weeks. She was informed of the importance of frequent follow-up visits to maximize her success with intensive lifestyle modifications for her multiple health conditions.   Objective:   Blood pressure 118/76, pulse 65, temperature 97.9 F (36.6 C), height 5\' 4"  (1.626 m), weight 164 lb (74.4 kg),  SpO2 98 %. Body mass index is 28.15 kg/m.  General: Cooperative, alert, well developed, in no acute distress. HEENT: Conjunctivae and lids unremarkable. Cardiovascular: Regular rhythm.  Lungs: Normal work of breathing. Neurologic: No focal deficits.     Lab Results  Component Value Date   CREATININE 0.55 (L) 02/18/2020   BUN 19 02/18/2020   NA 140 02/18/2020   K 4.4 02/18/2020   CL 102 02/18/2020   CO2 24 02/18/2020   Lab Results  Component Value Date   ALT 12 02/18/2020   AST 14 02/18/2020   ALKPHOS 103 02/18/2020   BILITOT 0.6 02/18/2020   Lab Results  Component Value Date   HGBA1C 5.3 02/18/2020   Lab Results  Component Value Date   INSULIN 11.4 02/18/2020   Lab Results  Component Value Date   TSH 1.830 02/18/2020   Lab Results  Component Value Date   CHOL 231 (H) 02/18/2020   HDL 62 02/18/2020   LDLCALC 154 (H) 02/18/2020   TRIG 87 02/18/2020   CHOLHDL 4.5 07/29/2019   Lab Results  Component Value Date   WBC 5.5 09/18/2019   HGB 15.4 09/18/2019   HCT 45.6 (H) 09/18/2019   MCV 89.8 09/18/2019   PLT 242 09/18/2019   No results found for: IRON, TIBC, FERRITIN  Attestation Statements:   Reviewed by clinician on day of visit: allergies, medications, problem list, medical history, surgical history, family history, social history, and previous encounter notes.  Fernanda Drum, am acting as Energy manager for [employer], DO   I have reviewed the above documentation for accuracy and completeness, and I agree with the above. Corinna Capra, DO

## 2020-05-28 LAB — BASIC METABOLIC PANEL
BUN: 14 (ref 4–21)
CO2: 28 — AB (ref 13–22)
Chloride: 101 (ref 99–108)
Creatinine: 0.7 (ref 0.5–1.1)
Glucose: 88
Potassium: 4.7 (ref 3.4–5.3)
Sodium: 140 (ref 137–147)

## 2020-05-28 LAB — LIPID PANEL
Cholesterol: 244 — AB (ref 0–200)
HDL: 61 (ref 35–70)
LDL Cholesterol: 160
Triglycerides: 127 (ref 40–160)

## 2020-05-28 LAB — COMPREHENSIVE METABOLIC PANEL
Calcium: 9.8 (ref 8.7–10.7)
GFR calc Af Amer: 103
GFR calc non Af Amer: 89

## 2020-05-31 ENCOUNTER — Encounter (INDEPENDENT_AMBULATORY_CARE_PROVIDER_SITE_OTHER): Payer: Self-pay | Admitting: Bariatrics

## 2020-06-16 ENCOUNTER — Ambulatory Visit (INDEPENDENT_AMBULATORY_CARE_PROVIDER_SITE_OTHER): Payer: 59 | Admitting: Family Medicine

## 2020-06-16 ENCOUNTER — Encounter (INDEPENDENT_AMBULATORY_CARE_PROVIDER_SITE_OTHER): Payer: Self-pay | Admitting: Family Medicine

## 2020-06-16 ENCOUNTER — Other Ambulatory Visit: Payer: Self-pay

## 2020-06-16 VITALS — BP 110/76 | HR 78 | Temp 98.1°F | Ht 64.0 in | Wt 164.0 lb

## 2020-06-16 DIAGNOSIS — Z9189 Other specified personal risk factors, not elsewhere classified: Secondary | ICD-10-CM | POA: Diagnosis not present

## 2020-06-16 DIAGNOSIS — Z6830 Body mass index (BMI) 30.0-30.9, adult: Secondary | ICD-10-CM

## 2020-06-16 DIAGNOSIS — E669 Obesity, unspecified: Secondary | ICD-10-CM

## 2020-06-16 DIAGNOSIS — E7849 Other hyperlipidemia: Secondary | ICD-10-CM | POA: Diagnosis not present

## 2020-06-16 DIAGNOSIS — E782 Mixed hyperlipidemia: Secondary | ICD-10-CM | POA: Insufficient documentation

## 2020-06-16 DIAGNOSIS — E8881 Metabolic syndrome: Secondary | ICD-10-CM | POA: Insufficient documentation

## 2020-06-16 DIAGNOSIS — E559 Vitamin D deficiency, unspecified: Secondary | ICD-10-CM | POA: Diagnosis not present

## 2020-06-16 MED ORDER — VITAMIN D (ERGOCALCIFEROL) 1.25 MG (50000 UNIT) PO CAPS: 50000.0000 [IU] | ORAL_CAPSULE | ORAL | 0 refills | Status: DC

## 2020-06-17 NOTE — Progress Notes (Signed)
Chief Complaint:   OBESITY Victoria Page is here to discuss her progress with her obesity treatment plan along with follow-up of her obesity related diagnoses. Victoria Page is on the Category 2 Plan and states she is following her eating plan approximately 50% of the time. Victoria Page states she has not exercised over the last 2-3 weeks.  Today's visit was #: 8 Starting weight: 175 lbs Starting date: 02/18/2020 Today's weight: 164 lbs Today's date: 06/16/2020 Total lbs lost to date: 11 lbs Total lbs lost since last in-office visit: 0  Interim History: Victoria Page was previously seen by colleagues.  She was given direction last office visit by Dr. Manson Passey to wok on meal planning and intentional eating, which she did not do.  She ate out a lot and drank more caloric beverages such as smoothies, etc.  She plans on getting back to it in the very near future, including starting to exercise again.  Assessment/Plan:   1. Other hyperlipidemia She was recently seen at her PCP's office and had blood work.  She has not heart back about the results yet.  She denies any chest pain, claudication or myalgias.  She is on no medication.  No symptoms or concerns.  Plan:  Continue to lower sat/trans fats in diet.  Reminded her of the importance of 93% lean meat in her diet and increasing fiber.  Start exercising again and increase as tolerated.  Continue prudent nutritional plan and weight loss.  Lab Results  Component Value Date   ALT 12 02/18/2020   AST 14 02/18/2020   ALKPHOS 103 02/18/2020   BILITOT 0.6 02/18/2020   Lab Results  Component Value Date   CHOL 231 (H) 02/18/2020   HDL 62 02/18/2020   LDLCALC 154 (H) 02/18/2020   TRIG 87 02/18/2020   CHOLHDL 4.5 07/29/2019   2. Insulin resistance Victoria Page has a diagnosis of insulin resistance based on her elevated fasting insulin level >5. She continues to work on diet and exercise to decrease her risk of diabetes.  Denies any concerns or issues.  No  medication.  Plan:  Continue prudent nutritional plan and weight loss.  Consider checking A1c and fasting insulin at next office visit as well as other labs as deemed necessary since it will be over 4 months since last checked.  Lab Results  Component Value Date   INSULIN 11.4 02/18/2020   Lab Results  Component Value Date   HGBA1C 5.3 02/18/2020   3. Vitamin D deficiency  Victoria Page A Yarbough has a history of Vitamin D deficiency with resultant generalized fatigue as her primary symptom.  she is taking vitamin D 50,000 IU weekly for this deficiency and tolerating it well without side-effect.   Most recent Vitamin D lab reviewed-  level: 32.2.  Plan:   - Discussed importance of vitamin D (as well as calcium) to their health and well-being.   - We reviewed possible symptoms of low Vitamin D including low energy, depressed mood, muscle aches, joint aches, osteoporosis etc.  - We discussed that low Vitamin D levels may be linked to an increased risk of cardiovascular events and even increased risk of cancers- such as colon and breast.   - Educated pt that weight loss will likely improve availability of vitamin D, thus encouraged Victoria Page to continue with meal plan and their weight loss efforts to further improve this condition  - I recommend pt take a weekly prescription vit D- see script below- which pt agrees to after discussion of  risks and benefits of this medication.      - Informed patient this may be a lifelong thing, and she was encouraged to continue to take the medicine until pt told otherwise.   We will need to monitor levels regularly ( q 3-4 mo on average )  to keep levels within normal limits.   - All pt's questions and concerns regarding this condition addressed  -Refill Vitamin D, Ergocalciferol, (DRISDOL) 1.25 MG (50000 UNIT) CAPS capsule; Take 1 capsule (50,000 Units total) by mouth every 7 (seven) days.  Dispense: 4 capsule; Refill: 0  4. At risk for heart disease Due  to Victoria Page's current state of health and medical condition(s), she is at a higher risk for heart disease.   This puts the patient at much greater risk to subsequently develop cardiopulmonary conditions that can significantly affect patient's quality of life in a negative manner as well.    At least 10 minutes was spent on counseling Victoria Page about these concerns today and I stressed the importance of reversing risks factors of obesity, esp truncal and visceral fat, hypertension, hyperlipidemia, pre-diabetes.   Initial goal is to lose at least 5-10% of starting weight to help reduce these risk factors.   Counseling: Intensive lifestyle modifications discussed with Victoria Page as most appropriate first line treatment.  she will continue to work on diet, exercise and weight loss efforts.  We will continue to reassess these conditions on a fairly regular basis in an attempt to decrease patient's overall morbidity and mortality.  Evidence-based interventions for health behavior change were utilized today including the discussion of self monitoring techniques, problem-solving barriers and SMART goal setting techniques.  Specifically regarding patient's less desirable eating habits and patterns, we employed the technique of small changes when Victoria Page has not been able to fully commit to her prudent nutritional plan.  5. Class 1 obesity with serious comorbidity and body mass index (BMI) of 30.0 to 30.9 in adult, unspecified obesity type  Victoria Page is currently in the action stage of change. As such, her goal is to continue with weight loss efforts. She has agreed to the Category 2 Plan.   Exercise goals: For substantial health benefits, adults should do at least 150 minutes (2 hours and 30 minutes) a week of moderate-intensity, or 75 minutes (1 hour and 15 minutes) a week of vigorous-intensity aerobic physical activity, or an equivalent combination of moderate- and vigorous-intensity aerobic activity. Aerobic  activity should be performed in episodes of at least 10 minutes, and preferably, it should be spread throughout the week. Get back to her walking 30-45 minutes 3-5 days per week.  Behavioral modification strategies: increasing lean protein intake, increasing water intake, decreasing eating out, meal planning and cooking strategies and planning for success.  Laryn has agreed to follow-up with our clinic in 2-3 weeks fasting. She was informed of the importance of frequent follow-up visits to maximize her success with intensive lifestyle modifications for her multiple health conditions.   Objective:   Blood pressure 110/76, pulse 78, temperature 98.1 F (36.7 C), height 5\' 4"  (1.626 m), weight 164 lb (74.4 kg), SpO2 99 %. Body mass index is 28.15 kg/m.  General: Cooperative, alert, well developed, in no acute distress. HEENT: Conjunctivae and lids unremarkable. Cardiovascular: Regular rhythm.  Lungs: Normal work of breathing. Neurologic: No focal deficits.   Lab Results  Component Value Date   CREATININE 0.55 (L) 02/18/2020   BUN 19 02/18/2020   NA 140 02/18/2020   K 4.4 02/18/2020  CL 102 02/18/2020   CO2 24 02/18/2020   Lab Results  Component Value Date   ALT 12 02/18/2020   AST 14 02/18/2020   ALKPHOS 103 02/18/2020   BILITOT 0.6 02/18/2020   Lab Results  Component Value Date   HGBA1C 5.3 02/18/2020   Lab Results  Component Value Date   INSULIN 11.4 02/18/2020   Lab Results  Component Value Date   TSH 1.830 02/18/2020   Lab Results  Component Value Date   CHOL 231 (H) 02/18/2020   HDL 62 02/18/2020   LDLCALC 154 (H) 02/18/2020   TRIG 87 02/18/2020   CHOLHDL 4.5 07/29/2019   Lab Results  Component Value Date   WBC 5.5 09/18/2019   HGB 15.4 09/18/2019   HCT 45.6 (H) 09/18/2019   MCV 89.8 09/18/2019   PLT 242 09/18/2019   Attestation Statements:   Reviewed by clinician on day of visit: allergies, medications, problem list, medical history, surgical  history, family history, social history, and previous encounter notes.  I, Insurance claims handler, CMA, am acting as Energy manager for [employer], DO.  I have reviewed the above documentation for accuracy and completeness, and I agree with the above. Carlye Grippe, D.O.  The 21st Century Cures Act was signed into law in 2016 which includes the topic of electronic health records.  This provides immediate access to information in MyChart.  This includes consultation notes, operative notes, office notes, lab results and pathology reports.  If you have any questions about what you read please let us know at your next visit so we can discuss your concerns and take corrective action if need be.  We are right here with you.

## 2020-06-24 ENCOUNTER — Other Ambulatory Visit (INDEPENDENT_AMBULATORY_CARE_PROVIDER_SITE_OTHER): Payer: Self-pay

## 2020-07-05 ENCOUNTER — Encounter (INDEPENDENT_AMBULATORY_CARE_PROVIDER_SITE_OTHER): Payer: Self-pay | Admitting: Family Medicine

## 2020-07-05 ENCOUNTER — Other Ambulatory Visit: Payer: Self-pay

## 2020-07-05 ENCOUNTER — Ambulatory Visit (INDEPENDENT_AMBULATORY_CARE_PROVIDER_SITE_OTHER): Payer: 59 | Admitting: Family Medicine

## 2020-07-05 VITALS — BP 120/81 | HR 65 | Temp 97.5°F | Ht 64.0 in | Wt 162.0 lb

## 2020-07-05 DIAGNOSIS — E559 Vitamin D deficiency, unspecified: Secondary | ICD-10-CM | POA: Diagnosis not present

## 2020-07-05 DIAGNOSIS — E8881 Metabolic syndrome: Secondary | ICD-10-CM

## 2020-07-05 DIAGNOSIS — Z9189 Other specified personal risk factors, not elsewhere classified: Secondary | ICD-10-CM | POA: Insufficient documentation

## 2020-07-05 DIAGNOSIS — I10 Essential (primary) hypertension: Secondary | ICD-10-CM

## 2020-07-05 DIAGNOSIS — E7849 Other hyperlipidemia: Secondary | ICD-10-CM | POA: Diagnosis not present

## 2020-07-05 DIAGNOSIS — E669 Obesity, unspecified: Secondary | ICD-10-CM

## 2020-07-05 DIAGNOSIS — Z6830 Body mass index (BMI) 30.0-30.9, adult: Secondary | ICD-10-CM

## 2020-07-05 DIAGNOSIS — R5383 Other fatigue: Secondary | ICD-10-CM | POA: Insufficient documentation

## 2020-07-05 MED ORDER — VITAMIN D (ERGOCALCIFEROL) 1.25 MG (50000 UNIT) PO CAPS: 50000.0000 [IU] | ORAL_CAPSULE | ORAL | 0 refills | Status: DC

## 2020-07-05 NOTE — Patient Instructions (Signed)
The 10-year ASCVD risk score Denman George DC Montez Hageman., et al., 2013) is: 5.7%*   Values used to calculate the score:     Age: 64 years     Sex: Female     Is Non-Hispanic African American: No     Diabetic: No     Tobacco smoker: No     Systolic Blood Pressure: 120 mmHg     Is BP treated: Yes     HDL Cholesterol: 61 mg/dL*     Total Cholesterol: 244 mg/dL*     * - Cholesterol units were assumed for this score calculation

## 2020-07-06 LAB — VITAMIN D 25 HYDROXY (VIT D DEFICIENCY, FRACTURES): Vit D, 25-Hydroxy: 53.4 ng/mL (ref 30.0–100.0)

## 2020-07-06 LAB — VITAMIN B12: Vitamin B-12: 731 pg/mL (ref 232–1245)

## 2020-07-06 LAB — INSULIN, RANDOM: INSULIN: 9.9 u[IU]/mL (ref 2.6–24.9)

## 2020-07-06 NOTE — Progress Notes (Signed)
Chief Complaint:   OBESITY Victoria Page is here to discuss her progress with her obesity treatment plan along with follow-up of her obesity related diagnoses. Victoria Page is on the Category 2 Plan and states she is following her eating plan approximately 90% of the time. Victoria Page states she is walking for 30-45 minutes 3 times per week.  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  She has stuck to the plan more over the past couple of weeks.  She focused on proteins and vegetables.   No issues or concerns.   Denies hunger or cravings.    She was recently seen at her PCP's office - CMP and FLP were obtained, but no other labs.  She is fasting today for labs.    Assessment/Plan:    Orders Placed This Encounter  Procedures  . VITAMIN D 25 Hydroxy (Vit-D Deficiency, Fractures)  . Vitamin B12  . Insulin, random    Meds ordered this encounter  Medications  . Vitamin D, Ergocalciferol, (DRISDOL) 1.25 MG (50000 UNIT) CAPS capsule    Sig: Take 1 capsule (50,000 Units total) by mouth every 7 (seven) days.    Dispense:  4 capsule    Refill:  0     1. Insulin resistance Discussed labs with patient today.  Victoria Page's insulin level was 1.4 and A1c was 5.3.  shehas lost 13 pounds since starting her weight loss journey with [place].    Plan:  Check fasting insulin level today.  Continue to decrease simple carbs and increase exercise to 150 minutes per week.  Lab Results  Component Value Date   INSULIN 11.4 02/18/2020   Lab Results  Component Value Date   HGBA1C 5.3 02/18/2020   - Insulin, random    2. Other hyperlipidemia Discussed labs with patient today.  HDL 61 at  PCP's, LDL 154 on 02/18/2020.  Recently at PCP's office  LDL was greater than 160.   She was told by her PCP that no medication was needed due to elevated HDL and good triglycerides per PCP.  Plan:    PCP recommended red yeast rice.  I concur since ASCVD risk is now 5.7%, pt declines low dose statin.  Increase exercise, decrease saturated fats by following the meal plan.  Recheck labs in 4 months.  The 10-year ASCVD risk score Victoria George DC Montez Hageman., et al., 2013) is: 5.7%*   Values used to calculate the score:     Age: 64 years     Sex: Female     Is Non-Hispanic African American: No     Diabetic: No     Tobacco smoker: No     Systolic Blood Pressure: 120 mmHg     Is BP treated: Yes     HDL Cholesterol: 61 mg/dL*     Total Cholesterol: 244 mg/dL*     * - Cholesterol units were assumed for this score calculation  Lab Results  Component Value Date   ALT 12 02/18/2020   AST 14 02/18/2020   ALKPHOS 103 02/18/2020   BILITOT 0.6 02/18/2020   Lab Results  Component Value Date   CHOL 244 (A) 05/28/2020   HDL 61  05/28/2020   LDLCALC 160 05/28/2020   TRIG 127 05/28/2020   CHOLHDL 4.5 07/29/2019   - Vitamin B12    3. Essential hypertension Discussed labs with patient today.  She is taking HCTZ.  Blood pressure is running 120s/85 or less at home.  Recent serum creatinine was within normal limits at PCPs office.  She checks her blood pressure at home once weekly on average.  Denies symptoms or concerns.  Plan:  Blood pressure is at goal.  Continue HCTZ.  BP Readings from Last 3 Encounters:  07/05/20 120/81  06/16/20 110/76  05/26/20 118/76   - Vitamin B12    4. Vitamin D deficiency Victoria Page A Gatz has a history of Vitamin D deficiency with resultant generalized fatigue as her primary symptom.  she is taking vitamin D 50,000 IU weekly for this deficiency and tolerating it well without side-effect.  Most recent Vitamin D lab reviewed-  level: 32.2 on 02/18/2020.  Endorses fatigue and  tiredness still, but improved since starting vitamin D.  Plan:  - Reiterated importance of vitamin D (as well as calcium) to their health and wellbeing.  - Reminded pt that weight loss will likely improve availability of vitamin D, thus encouraged Victoria Page to continue with meal plan and their weight loss efforts to further improve this condition. - I recommend pt continue to take weekly prescription vit D 50,000 IU - Informed patient this may be a lifelong thing, and she was encouraged to continue to take the medicine until told otherwise.   - We will need to monitor levels regularly (every 3-4 mo on average) to keep levels within normal limits.  - pt's questions and concerns regarding this condition addressed.  -Check labs today.  -Refill Vitamin D, Ergocalciferol, (DRISDOL) 1.25 MG (50000 UNIT) CAPS capsule; Take 1 capsule (50,000 Units total) by mouth every 7 (seven) days.  Dispense: 4 capsule; Refill: 0  - VITAMIN D 25 Hydroxy (Vit-D Deficiency, Fractures) - Vitamin B12    5. Fatigue, unspecified type Victoria Page says she has been more fatigued than usual recently.  Little improvement in energy levels over the past 3-4 months.  Plan:  Will check labs today.  - VITAMIN D 25 Hydroxy (Vit-D Deficiency, Fractures) - Vitamin B12 - Insulin, random    6. At risk for osteoporosis Victoria Page was given approximately 9 minutes of osteoporosis prevention counseling today.  Victoria Page is at risk for osteopenia and osteoporosis due to Vitamin D deficiency, as well as other risk factors.  We discussed the importance of prudent screenings through her PCP's office for prevention.  Victoria Page was encouraged to take her Vitamin D and follow her calcium rich diet.  We will continue to monitor vitamin D levels to ensure treatment is appropriate.  It is recommended that she eventually engage in weight bearing exercises and muscle strengthening exercises to help improve bone density and decrease her risk of  osteopenia and osteoporosis.    7. Class 1 obesity with serious comorbidity and body mass index (BMI) of 30.0 to 30.9 in adult, unspecified obesity type  Victoria Page is currently in the action stage of change. As such, her goal is to continue with weight loss efforts. She has agreed to the Category 2 Plan.   Exercise goals: For substantial health benefits, adults should do at least 150 minutes (2 hours and 30 minutes) a week of moderate-intensity, or 75 minutes (1 hour and 15 minutes) a week of vigorous-intensity aerobic physical activity, or an equivalent combination of moderate- and vigorous-intensity aerobic  activity. Aerobic activity should be performed in episodes of at least 10 minutes, and preferably, it should be spread throughout the week.  Behavioral modification strategies: decreasing simple carbohydrates, decrease saturated fats, increasing water intake and planning for success.  Victoria Page has agreed to follow-up with our clinic in 3 weeks. She was informed of the importance of frequent follow-up visits to maximize her success with intensive lifestyle modifications for her multiple health conditions.   Victoria Page was informed we would discuss her lab results at her next visit unless there is a critical issue that needs to be addressed sooner. Victoria Page agreed to keep her next visit at the agreed upon time to discuss these results.  Objective:   Blood pressure 120/81, pulse 65, temperature (!) 97.5 F (36.4 C), height 5\' 4"  (1.626 m), weight 162 lb (73.5 kg), SpO2 98 %. Body mass index is 27.81 kg/m.  General: Cooperative, alert, well developed, in no acute distress. HEENT: Conjunctivae and lids unremarkable. Cardiovascular: Regular rhythm.  Lungs: Normal work of breathing. Neurologic: No focal deficits.   Lab Results  Component Value Date   CREATININE 0.7 05/28/2020   BUN 14 05/28/2020   NA 140 05/28/2020   K 4.7 05/28/2020   CL 101 05/28/2020   CO2 28 (A) 05/28/2020   Lab  Results  Component Value Date   ALT 12 02/18/2020   AST 14 02/18/2020   ALKPHOS 103 02/18/2020   BILITOT 0.6 02/18/2020   Lab Results  Component Value Date   HGBA1C 5.3 02/18/2020   Lab Results  Component Value Date   INSULIN 9.9 07/05/2020   INSULIN 11.4 02/18/2020   Lab Results  Component Value Date   TSH 1.830 02/18/2020   Lab Results  Component Value Date   CHOL 244 (A) 05/28/2020   HDL 61 05/28/2020   LDLCALC 160 05/28/2020   TRIG 127 05/28/2020   CHOLHDL 4.5 07/29/2019   Lab Results  Component Value Date   WBC 5.5 09/18/2019   HGB 15.4 09/18/2019   HCT 45.6 (H) 09/18/2019   MCV 89.8 09/18/2019   PLT 242 09/18/2019   Attestation Statements:   Reviewed by clinician on day of visit: allergies, medications, problem list, medical history, surgical history, family history, social history, and previous encounter notes.  I, 09/20/2019, CMA, am acting as Insurance claims handler for Energy manager, DO.  I have reviewed the above documentation for accuracy and completeness, and I agree with the above. [employer], D.O.  The 21st Century Cures Act was signed into law in 2016 which includes the topic of electronic health records.  This provides immediate access to information in MyChart.  This includes consultation notes, operative notes, office notes, lab results and pathology reports.  If you have any questions about what you read please let 2017 know at your next visit so we can discuss your concerns and take corrective action if need be.  We are right here with you.

## 2020-07-13 ENCOUNTER — Encounter: Payer: Self-pay | Admitting: Gastroenterology

## 2020-07-13 ENCOUNTER — Ambulatory Visit (INDEPENDENT_AMBULATORY_CARE_PROVIDER_SITE_OTHER): Payer: 59 | Admitting: Gastroenterology

## 2020-07-13 VITALS — BP 120/80 | HR 73 | Ht 64.0 in | Wt 168.2 lb

## 2020-07-13 DIAGNOSIS — R1012 Left upper quadrant pain: Secondary | ICD-10-CM

## 2020-07-13 DIAGNOSIS — K219 Gastro-esophageal reflux disease without esophagitis: Secondary | ICD-10-CM

## 2020-07-13 DIAGNOSIS — K5900 Constipation, unspecified: Secondary | ICD-10-CM

## 2020-07-13 DIAGNOSIS — R142 Eructation: Secondary | ICD-10-CM | POA: Diagnosis not present

## 2020-07-13 MED ORDER — PANTOPRAZOLE SODIUM 40 MG PO TBEC
DELAYED_RELEASE_TABLET | ORAL | 3 refills | Status: DC
Start: 2020-07-13 — End: 2020-07-23

## 2020-07-13 MED ORDER — POLYETHYLENE GLYCOL 3350 17 G PO PACK
PACK | ORAL | 0 refills | Status: DC
Start: 2020-07-13 — End: 2020-08-02

## 2020-07-13 NOTE — Patient Instructions (Addendum)
If you are age 63 or older, your body mass index should be between 23-30. Your Body mass index is 28.87 kg/m. If this is out of the aforementioned range listed, please consider follow up with your Primary Care Provider.  If you are age 86 or younger, your body mass index should be between 19-25. Your Body mass index is 28.87 kg/m. If this is out of the aformentioned range listed, please consider follow up with your Primary Care Provider.   Please purchase the following medications over the counter and take as directed: Miralax: Take as directed twice daily. Titrate as needed  We have sent the following medications to your pharmacy for you to pick up at your convenience: Pantoprazole 40 mg: Take once to twice daily  You will be due for a recall colonoscopy in 09-2026. We will send you a reminder in the mail when it gets closer to that time.   Thank you for entrusting me with your care and for choosing [facility], Dr. Ileene Patrick

## 2020-07-13 NOTE — Progress Notes (Signed)
HPI :  64 year old female with a history of reported irritable bowel syndrome, GERD, abdominal pain, gallstones, referred here by Dorette Grate MD for another opinion regarding chronic gastrointestinal symptoms.  She most recently seen by Dr. Patty Sermons office earlier this year. She remotely was seen by our practice in 11/2013.   She has a few issues she would like to discuss today.  Most bothersome to her appears to be frequent symptoms of pyrosis and belching.  She states this is been ongoing for years.  She has failed Pepcid and H2 blockers in the past.  She has been using omeprazole 40 mg a day for the past year.  Initially she took it for a few weeks every day and did not help too much.  She has been using it about 3 days a week ongoing for a while but does not control her symptoms and she has breakthrough on a daily basis that bothers her.  She endorses pyrosis frequently as well as occasional belching.  She denies any dysphagia.  No nausea or vomiting.  No routine use of NSAIDs.  She has never had an upper endoscopy.  Unclear if she has tried other PPIs remotely in the past.  She does endorse ongoing left upper quadrant pain for which she states is a few years.  It tends to come and go.  Usually bothers her after she eats, that can occasionally make things worse.  Severity can fluctuate, feels like a stabbing pain when it occurs.  She states it has been about the same over the past few years.  Omeprazole has not helped that at all.  She does have a prior ultrasound that showed gallstones in 2020, follow-up HIDA scan was negative.  She is been seen by surgery from what she reports, was told that her symptoms were atypical for biliary colic.  She endorses altered bowel habits historically.  She states previously had loose stools at baseline and now has been constipated for the past year or 2.  She will have pain in her lower abdomen if she does not go to the bathroom, and pain with a bowel movement, which  is reliably relieved with a bowel movement.  She has been taking smooth move laxative which helps her go to the bathroom but this can cause some urgent stools.  She has used Correctal in the past.  She was given a trial of Linzess at her prior GI provider's office and states this helped with her constipation but it made her reflux and belching much worse so she stopped it.  She denies use of MiraLAX in the past. She has tried levsin and bentyl in the past which did not help too much.  She had a colonoscopy in 2018 which was reportedly normal other than hemorrhoids.  No polyps.  She denies any family history of colon cancer or gastric cancer.  Prior work-up Colonoscopy 09/2016 - normal exam, external hemorrhoids -   [place] 01/15/19 - gallstones  HIDA - 01/24/19 - normal  CT abdomen / pelvis 08/18/17 - no acute changes, fatty atrophy of pancreatic head  Stool occult blood 05/17/20 - negative Celiac panel negative 09/23/19    Past Medical History:  Diagnosis Date  . Anxiety   . Back pain   . Constipation   . Gallstones   . GERD (gastroesophageal reflux disease)   . High cholesterol   . High triglycerides   . History of sinus problem   . Hypertension   . IBS (irritable  bowel syndrome)   . Joint pain   . LLQ pain 08/19/2014  . Plantar fasciitis   . SOB (shortness of breath)      Past Surgical History:  Procedure Laterality Date  . COLONOSCOPY N/A 09/06/2016   Procedure: COLONOSCOPY;  Surgeon: Malissa Hippo, MD;  Location: [facility];  Service: Endoscopy;  Laterality: N/A;  8:30  . FLEXIBLE SIGMOIDOSCOPY     Family History  Problem Relation Age of Onset  . Lung cancer Mother   . Anxiety disorder Mother   . Alcoholism Mother   . Heart attack Father   . Sudden death Father   . Diverticulitis Paternal Grandmother   . Diverticulitis Brother        died from septic shock  . Breast cancer Daughter 15  . COPD Brother   . Colon cancer Neg Hx    Social History   Tobacco Use  .  Smoking status: Never Smoker  . Smokeless tobacco: Never Used  Vaping Use  . Vaping Use: Never used  Substance Use Topics  . Alcohol use: Yes    Comment: "Once every 2 months"  . Drug use: No   Current Outpatient Medications  Medication Sig Dispense Refill  . ALPRAZolam (XANAX) 0.5 MG tablet Take 0.5 mg by mouth daily as needed.    . hydrochlorothiazide (HYDRODIURIL) 25 MG tablet Take 25 mg by mouth daily.    . Vitamin D, Ergocalciferol, (DRISDOL) 1.25 MG (50000 UNIT) CAPS capsule Take 1 capsule (50,000 Units total) by mouth every 7 (seven) days. 4 capsule 0  . pantoprazole (PROTONIX) 40 MG tablet Take 1 tablet (40 mg)once to twice daily 60 tablet 3  . polyethylene glycol (MIRALAX) 17 g packet Take as directed twice and increase as needed 14 each 0   No current facility-administered medications for this visit.   Allergies  Allergen Reactions  . Shrimp [Shellfish Allergy] Nausea And Vomiting  . Sulfa Antibiotics      Review of Systems: All systems reviewed and negative except where noted in HPI.   Lab Results  Component Value Date   WBC 5.5 09/18/2019   HGB 15.4 09/18/2019   HCT 45.6 (H) 09/18/2019   MCV 89.8 09/18/2019   PLT 242 09/18/2019    Lab Results  Component Value Date   CREATININE 0.7 05/28/2020   BUN 14 05/28/2020   NA 140 05/28/2020   K 4.7 05/28/2020   CL 101 05/28/2020   CO2 28 (A) 05/28/2020    Lab Results  Component Value Date   ALT 12 02/18/2020   AST 14 02/18/2020   ALKPHOS 103 02/18/2020   BILITOT 0.6 02/18/2020     Physical Exam: BP 120/80   Pulse 73   Ht 5\' 4"  (1.626 m)   Wt 168 lb 3.2 oz (76.3 kg)   SpO2 98%   BMI 28.87 kg/m  Constitutional: Pleasant,well-developed, female in no acute distress. HEENT: Normocephalic and atraumatic. Conjunctivae are normal. No scleral icterus. Neck supple.  Cardiovascular: Normal rate, regular rhythm.  Pulmonary/chest: Effort normal and breath sounds normal. No wheezing, rales or  rhonchi. Abdominal: Soft, nondistended, mild LUQ TTP, rhere are no masses palpable.  Extremities: no edema Lymphadenopathy: No cervical adenopathy noted. Neurological: Alert and oriented to person place and time. Skin: Skin is warm and dry. No rashes noted. Psychiatric: Normal mood and affect. Behavior is normal.   ASSESSMENT AND PLAN: 64 year old female here for reassessment of the following:  GERD / belching / LUQ pain - longstanding symptoms  of reflux and belching.  She has failed H2 blockers and sounds like moderate dose omeprazole has not provided much benefit although more recently she is not taking it daily.  I discussed options with her.  Given failure of standard dosing PPI and chronicity of her symptoms, in addition to her upper abdominal pain that has a prandial relationship, I think an EGD is reasonable to further evaluate this.  I discussed what EGD is, risks and benefits of the exam and she want to proceed.  She does have some specific concerns about her perforation risk with this as she has a friend who had a severe esophageal perforation.  I explained to her that that is incredibly rare, she has no dysphagia, no plans for dilation, risk for perforation during a diagnostic exam is exceedingly low.  Following our discussion she wanted to proceed with it.  In interim we will stop the omeprazole, stop Pepcid.  We will give her pantoprazole 40 mg to take once daily and can titrate up to twice daily as needed to see if that will help.  I did discuss long-term risks of chronic PPI use with her as she states she does not want to take medication to control this long-term.  Advised to try Protonix for now to see if we can gain control of her symptoms, long-term want to use the lowest dose of the medication possible to control symptoms or use of an alternative modality to treat reflux.  We will use the EGD to determine if she is a candidate for TIF if that something she wants to consider or if she  has a large hiatal hernia could consider Nissen fundoplication with hernia repair, depends on how aggressive she wants to be with management of this pending findings in regards to risks/ benefits of these interventions versus chronic PPI use.  We had a lengthy discussion about this and she understands, will await her course on Protonix and EGD findings.  IBS-C - discussed options with her.  She really does not want to take anymore pills such as Linzess right now.  Will recommend MiraLAX starting at twice daily dosing, she can titrate up or down as needed pending her course.  Colonoscopy up-to-date.  Ileene Patrick, MD [facility]   CC: Judee Clara Minerva Fester, MD

## 2020-07-15 ENCOUNTER — Other Ambulatory Visit: Payer: Self-pay | Admitting: Gastroenterology

## 2020-07-15 LAB — SARS CORONAVIRUS 2 (TAT 6-24 HRS): SARS Coronavirus 2: NEGATIVE

## 2020-07-19 ENCOUNTER — Other Ambulatory Visit: Payer: Self-pay

## 2020-07-19 ENCOUNTER — Ambulatory Visit (INDEPENDENT_AMBULATORY_CARE_PROVIDER_SITE_OTHER): Payer: 59 | Admitting: Family Medicine

## 2020-07-19 ENCOUNTER — Encounter: Payer: Self-pay | Admitting: Gastroenterology

## 2020-07-19 ENCOUNTER — Ambulatory Visit (AMBULATORY_SURGERY_CENTER): Payer: 59 | Admitting: Gastroenterology

## 2020-07-19 VITALS — BP 124/76 | HR 62 | Temp 96.8°F | Resp 13 | Ht 64.0 in | Wt 168.0 lb

## 2020-07-19 DIAGNOSIS — R1010 Upper abdominal pain, unspecified: Secondary | ICD-10-CM

## 2020-07-19 DIAGNOSIS — K3189 Other diseases of stomach and duodenum: Secondary | ICD-10-CM | POA: Diagnosis not present

## 2020-07-19 DIAGNOSIS — R142 Eructation: Secondary | ICD-10-CM

## 2020-07-19 DIAGNOSIS — K5713 Diverticulitis of small intestine without perforation or abscess with bleeding: Secondary | ICD-10-CM

## 2020-07-19 DIAGNOSIS — K219 Gastro-esophageal reflux disease without esophagitis: Secondary | ICD-10-CM

## 2020-07-19 MED ORDER — SODIUM CHLORIDE 0.9 % IV SOLN: 500.0000 mL | INTRAVENOUS | Status: DC

## 2020-07-19 NOTE — Op Note (Signed)
[facility] Patient Name: Victoria NanasVictoria Page Procedure Date: 07/19/2020 9:35 AM MRN: [id number] Endoscopist: Viviann SpareSteven P. Adela LankArmbruster , MD Age: 64 Referring MD:  Date of Birth: December 18, 1955 Gender: Female Account #: [id number] Procedure:                Upper GI endoscopy Indications:              Suspected gastro-esophageal reflux disease,                            Eructation - failure of omeprazole / pepcid in the                            past, recently recommended protonix but has only                            had a few doses, occasional upper abdominal pain Medicines:                Monitored Anesthesia Care Procedure:                Pre-Anesthesia Assessment:                           - Prior to the procedure, a History and Physical                            was performed, and patient medications and                            allergies were reviewed. The patient's tolerance of                            previous anesthesia was also reviewed. The risks                            and benefits of the procedure and the sedation                            options and risks were discussed with the patient.                            All questions were answered, and informed consent                            was obtained. Prior Anticoagulants: The patient has                            taken no previous anticoagulant or antiplatelet                            agents. ASA Grade Assessment: II - A patient with                            mild systemic disease. After reviewing the risks  and benefits, the patient was deemed in                            satisfactory condition to undergo the procedure.                           After obtaining informed consent, the endoscope was                            passed under direct vision. Throughout the                            procedure, the patient's blood pressure, pulse, and                             oxygen saturations were monitored continuously. The                            Endoscope was introduced through the mouth, and                            advanced to the second part of duodenum. The upper                            GI endoscopy was accomplished without difficulty.                            The patient tolerated the procedure well. Scope In: Scope Out: Findings:                 Esophagogastric landmarks were identified: the                            Z-line was found at 35 cm, the gastroesophageal                            junction was found at 35 cm and the upper extent of                            the gastric folds was found at 35 cm from the                            incisors.                           The exam of the esophagus was otherwise normal. No                            obvious erosive changes.                           Biopsies were taken with a cold forceps in the  upper third of the esophagus, in the middle third                            of the esophagus and in the lower third of the                            esophagus for histology to rule out eosinophilic                            esophagitis.                           The entire examined stomach was normal. Biopsies                            were taken with a cold forceps for Helicobacter                            pylori testing.                           Two medium to large diverticulum were found in the                            second portion of the duodenum.                           The exam of the duodenum was otherwise normal. Complications:            No immediate complications. Estimated blood loss:                            Minimal. Estimated Blood Loss:     Estimated blood loss was minimal. Impression:               - Esophagogastric landmarks identified.                           - Normal esophagus otherwise. Biopsies taken to                             rule out EoE                           - Normal stomach. Biopsied to rule out H pylori.                           - Duodenal diverticulum x 2.                           - Normal duodenum otherwise Recommendation:           - Patient has a contact number available for                            emergencies. The signs and symptoms of potential  delayed complications were discussed with the                            patient. Return to normal activities tomorrow.                            Written discharge instructions were provided to the                            patient.                           - Resume previous diet.                           - Continue present medications.                           - Await pathology results.                           - Recommend trial of protonix or other PPI for full                            4 weeks daily to see if any improvement. Would                            retry Protonix, patient endorsed some itching with                            first dose but no rash, would monitor for recurrent                            symptoms and if this occurs please contact me Viviann Spare P. Haidan Nhan, MD 07/19/2020 9:52:35 AM This report has been signed electronically.

## 2020-07-19 NOTE — Progress Notes (Signed)
HIPPA pt - discharge packet and report were sealed in an envelpoe and given to pt.  No problems noted in the recovery room. maw

## 2020-07-19 NOTE — Progress Notes (Signed)
Report given to PACU, vss 

## 2020-07-19 NOTE — Patient Instructions (Addendum)
Per Dr. Adela Lank restart PROTONIX take daily for 4 weeks to see if any improvement.  Per Dr. Adela Lank, you already have this prescription, but previously had some itching with first dose but no improvement.  He would like to retry this Protonix.  If you have itching or any other symptoms, please call the office.  If your symptoms do not improve, please call the office. You may resume your current medications today. Await biopsy results. Please call if any questions or concerns.     YOU HAD AN ENDOSCOPIC PROCEDURE TODAY AT THE [facility]:   Refer to the procedure report that was given to you for any specific questions about what was found during the examination.  If the procedure report does not answer your questions, please call your gastroenterologist to clarify.  If you requested that your care partner not be given the details of your procedure findings, then the procedure report has been included in a sealed envelope for you to review at your convenience later.  YOU SHOULD EXPECT: Some feelings of bloating in the abdomen. Passage of more gas than usual.  Walking can help get rid of the air that was put into your GI tract during the procedure and reduce the bloating. If you had a lower endoscopy (such as a colonoscopy or flexible sigmoidoscopy) you may notice spotting of blood in your stool or on the toilet paper. If you underwent a bowel prep for your procedure, you may not have a normal bowel movement for a few days.  Please Note:  You might notice some irritation and congestion in your nose or some drainage.  This is from the oxygen used during your procedure.  There is no need for concern and it should clear up in a day or so.  SYMPTOMS TO REPORT IMMEDIATELY:   Following upper endoscopy (EGD)  Vomiting of blood or coffee ground material  New chest pain or pain under the shoulder blades  Painful or persistently difficult swallowing  New shortness of breath  Fever of 100F  or higher  Black, tarry-looking stools  For urgent or emergent issues, a gastroenterologist can be reached at any hour by calling [phone]. Do not use MyChart messaging for urgent concerns.    DIET:  We do recommend a small meal at first, but then you may proceed to your regular diet.  Drink plenty of fluids but you should avoid alcoholic beverages for 24 hours.  ACTIVITY:  You should plan to take it easy for the rest of today and you should NOT DRIVE or use heavy machinery until tomorrow (because of the sedation medicines used during the test).    FOLLOW UP: Our staff will call the number listed on your records 48-72 hours following your procedure to check on you and address any questions or concerns that you may have regarding the information given to you following your procedure. If we do not reach you, we will leave a message.  We will attempt to reach you two times.  During this call, we will ask if you have developed any symptoms of COVID 19. If you develop any symptoms (ie: fever, flu-like symptoms, shortness of breath, cough etc.) before then, please call [phone].  If you test positive for Covid 19 in the 2 weeks post procedure, please call and report this information to [place].    If any biopsies were taken you will be contacted by phone or by letter within the next 1-3 weeks.  Please call us  at [phone] if you have not heard about the biopsies in 3 weeks.    SIGNATURES/CONFIDENTIALITY: You and/or your care partner have signed paperwork which will be entered into your electronic medical record.  These signatures attest to the fact that that the information above on your After Visit Summary has been reviewed and is understood.  Full responsibility of the confidentiality of this discharge information lies with you and/or your care-partner.

## 2020-07-19 NOTE — Progress Notes (Signed)
0936 Robinul 0.1 mg IV given due large amount of secretions upon assessment.  MD aware, vss

## 2020-07-19 NOTE — Progress Notes (Signed)
Called to room to assist during endoscopic procedure.  Patient ID and intended procedure confirmed with present staff. Received instructions for my participation in the procedure from the performing physician.  

## 2020-07-20 ENCOUNTER — Ambulatory Visit (INDEPENDENT_AMBULATORY_CARE_PROVIDER_SITE_OTHER): Payer: 59 | Admitting: Bariatrics

## 2020-07-20 ENCOUNTER — Other Ambulatory Visit: Payer: Self-pay

## 2020-07-20 ENCOUNTER — Encounter (INDEPENDENT_AMBULATORY_CARE_PROVIDER_SITE_OTHER): Payer: Self-pay | Admitting: Bariatrics

## 2020-07-20 VITALS — BP 136/83 | HR 66 | Temp 98.3°F | Ht 64.0 in | Wt 162.0 lb

## 2020-07-20 DIAGNOSIS — E6609 Other obesity due to excess calories: Secondary | ICD-10-CM | POA: Diagnosis not present

## 2020-07-20 DIAGNOSIS — I10 Essential (primary) hypertension: Secondary | ICD-10-CM | POA: Diagnosis not present

## 2020-07-20 DIAGNOSIS — Z6830 Body mass index (BMI) 30.0-30.9, adult: Secondary | ICD-10-CM

## 2020-07-20 DIAGNOSIS — E559 Vitamin D deficiency, unspecified: Secondary | ICD-10-CM

## 2020-07-20 DIAGNOSIS — E669 Obesity, unspecified: Secondary | ICD-10-CM | POA: Diagnosis not present

## 2020-07-20 NOTE — Progress Notes (Signed)
Chief Complaint:   OBESITY Victoria Page is here to discuss her progress with her obesity treatment plan along with follow-up of her obesity related diagnoses. Victoria Page is on the Category 2 Plan and states she is following her eating plan approximately 90% of the time. Victoria Page states she is exercising 0 minutes 0 times per week.  Today's visit was #: 10 Starting weight: 175 lbs Starting date: 02/18/2020 Today's weight: 162 lbs Today's date: 07/20/2020 Total lbs lost to date: 13 Total lbs lost since last in-office visit: 0  Interim History: Victoria Page's weight remains the same. She states she is not drinking enough water.  Subjective:   Essential hypertension. Victoria Page is taking HCTZ. Blood pressure is controlled.  BP Readings from Last 3 Encounters:  07/20/20 136/83  07/19/20 124/76  07/13/20 120/80   Lab Results  Component Value Date   CREATININE 0.7 05/28/2020   CREATININE 0.55 (L) 02/18/2020   CREATININE 0.80 09/18/2019   Vitamin D deficiency. Victoria Page is taking high dose Vitamin D.   Ref. Range 07/05/2020 09:25  Vitamin D, 25-Hydroxy Latest Ref Range: 30.0 - 100.0 ng/mL 53.4   Assessment/Plan:   Essential hypertension. Victoria Page is working on healthy weight loss and exercise to improve blood pressure control. We will watch for signs of hypotension as she continues her lifestyle modifications. She will continue her medication as directed.   Vitamin D deficiency. Low Vitamin D level contributes to fatigue and are associated with obesity, breast, and colon cancer. She agrees to continue to take Vitamin D 2,000 IU daily and will follow-up for routine testing of Vitamin D in 3-4 months.  Class 1 obesity with serious comorbidity and body mass index (BMI) of 30.0 to 30.9 in adult, unspecified obesity type - BMI greater than 30 at start.  Victoria Page is currently in the action stage of change. As such, her goal is to continue with weight loss efforts. She has agreed to the  Category 2 Plan.   She will work on meal planning and mindful eating. We discussed Thanksgiving strategies.  Exercise goals: Victoria Page will continue walking and will go back to the gym.  Behavioral modification strategies: increasing lean protein intake, decreasing simple carbohydrates, increasing vegetables, increasing water intake, decreasing eating out, no skipping meals, meal planning and cooking strategies, keeping healthy foods in the home and planning for success.  Victoria Page has agreed to follow-up with our clinic in 2 weeks. She was informed of the importance of frequent follow-up visits to maximize her success with intensive lifestyle modifications for her multiple health conditions.   Objective:   Blood pressure 136/83, pulse 66, temperature 98.3 F (36.8 C), temperature source Oral, height 5\' 4"  (1.626 m), weight 162 lb (73.5 kg), SpO2 97 %. Body mass index is 27.81 kg/m.  General: Cooperative, alert, well developed, in no acute distress. HEENT: Conjunctivae and lids unremarkable. Cardiovascular: Regular rhythm.  Lungs: Normal work of breathing. Neurologic: No focal deficits.   Lab Results  Component Value Date   CREATININE 0.7 05/28/2020   BUN 14 05/28/2020   NA 140 05/28/2020   K 4.7 05/28/2020   CL 101 05/28/2020   CO2 28 (A) 05/28/2020   Lab Results  Component Value Date   ALT 12 02/18/2020   AST 14 02/18/2020   ALKPHOS 103 02/18/2020   BILITOT 0.6 02/18/2020   Lab Results  Component Value Date   HGBA1C 5.3 02/18/2020   Lab Results  Component Value Date   INSULIN 9.9 07/05/2020   INSULIN  11.4 02/18/2020   Lab Results  Component Value Date   TSH 1.830 02/18/2020   Lab Results  Component Value Date   CHOL 244 (A) 05/28/2020   HDL 61 05/28/2020   LDLCALC 160 05/28/2020   TRIG 127 05/28/2020   CHOLHDL 4.5 07/29/2019   Lab Results  Component Value Date   WBC 5.5 09/18/2019   HGB 15.4 09/18/2019   HCT 45.6 (H) 09/18/2019   MCV 89.8 09/18/2019    PLT 242 09/18/2019   No results found for: IRON, TIBC, FERRITIN  Attestation Statements:   Reviewed by clinician on day of visit: allergies, medications, problem list, medical history, surgical history, family history, social history, and previous encounter notes.  Time spent on visit including pre-visit chart review and post-visit charting and care was 20 minutes.   Fernanda Drum, am acting as Energy manager for [employer], DO   I have reviewed the above documentation for accuracy and completeness, and I agree with the above. Corinna Capra, DO

## 2020-07-21 ENCOUNTER — Telehealth: Payer: Self-pay | Admitting: *Deleted

## 2020-07-21 NOTE — Telephone Encounter (Signed)
  Follow up Call-  Call back number 07/19/2020  Post procedure Call Back phone  # [phone]  Permission to leave phone message Yes  Some recent data might be hidden     Patient questions:  Do you have a fever, pain , or abdominal swelling? No. Pain Score  0 *  Have you tolerated food without any problems? Yes.    Have you been able to return to your normal activities? Yes.    Do you have any questions about your discharge instructions: Diet   No. Medications  No. Follow up visit  No.  Do you have questions or concerns about your Care? No.  Actions: * If pain score is 4 or above: No action needed, pain <4.  1. Have you developed a fever since your procedure? no  2.   Have you had an respiratory symptoms (SOB or cough) since your procedure? no  3.   Have you tested positive for COVID 19 since your procedure no  4.   Have you had any family members/close contacts diagnosed with the COVID 19 since your procedure? no   If yes to any of these questions please route to Laverna Peace, RN and Karlton Lemon, RN

## 2020-07-22 ENCOUNTER — Telehealth: Payer: Self-pay | Admitting: Gastroenterology

## 2020-07-22 NOTE — Telephone Encounter (Signed)
Pt is requesting a call back from a nurse to discuss her ongoing symptoms since her last EGD done on 07/19/2020, pt is experiencing faintess/diarrhea/lower abdominal pain. Pt would like to be seen sometime soon, no appts available.

## 2020-07-22 NOTE — Telephone Encounter (Signed)
Spoke with patient, she states that she feels faint and dizzy with bowel movements, reports lower abdominal/back pain, some diarrhea this morning. Patient wanted to be scheduled for a soon appt, advised that Dr. Adela Lank is completely booked into December at this time. She has been scheduled with Hyacinth Meeker, PA on 07/23/20 at 3:30 PM.   CMA's have been notified that patient has been added on.

## 2020-07-23 ENCOUNTER — Encounter: Payer: Self-pay | Admitting: Physician Assistant

## 2020-07-23 ENCOUNTER — Ambulatory Visit (INDEPENDENT_AMBULATORY_CARE_PROVIDER_SITE_OTHER): Payer: 59 | Admitting: Physician Assistant

## 2020-07-23 VITALS — BP 124/84 | HR 72 | Ht 64.0 in | Wt 167.0 lb

## 2020-07-23 DIAGNOSIS — R1032 Left lower quadrant pain: Secondary | ICD-10-CM

## 2020-07-23 DIAGNOSIS — K219 Gastro-esophageal reflux disease without esophagitis: Secondary | ICD-10-CM

## 2020-07-23 DIAGNOSIS — K5900 Constipation, unspecified: Secondary | ICD-10-CM | POA: Diagnosis not present

## 2020-07-23 MED ORDER — PANTOPRAZOLE SODIUM 40 MG PO TBEC: DELAYED_RELEASE_TABLET | ORAL | 5 refills | Status: DC

## 2020-07-23 MED ORDER — LUBIPROSTONE 8 MCG PO CAPS: 8.0000 ug | ORAL_CAPSULE | Freq: Two times a day (BID) | ORAL | 5 refills | Status: DC

## 2020-07-23 NOTE — Patient Instructions (Signed)
If you are age 63 or older, your body mass index should be between 23-30. Your Body mass index is 28.67 kg/m. If this is out of the aforementioned range listed, please consider follow up with your Primary Care Provider.  If you are age 53 or younger, your body mass index should be between 19-25. Your Body mass index is 28.67 kg/m. If this is out of the aformentioned range listed, please consider follow up with your Primary Care Provider.   We have sent the following medications to your pharmacy for you to pick up at your convenience: Pantoprazole 40 mg once daily before breakfast. Amitiza twice daily before meals.   Thank you for choosing me and [facility].  Hyacinth Meeker , PA-C

## 2020-07-23 NOTE — Progress Notes (Signed)
Agree with assessment and plan as outlined.  

## 2020-07-23 NOTE — Progress Notes (Signed)
Chief Complaint: Follow-up after EGD, chronic lower abdominal pain  HPI:    Victoria Page is a 64 year old female with a past medical history as listed below, known to Dr. Adela Lank, who was referred to me by Marylynn Pearson, FNP for follow-up after EGD and chronic lower abdominal pain.      07/13/2020 office visit with Dr. Adela Lank.  Discussed chronic gastrointestinal symptoms.  Most bothered some with some pyrosis and belching ongoing for years.  Had failed Pepcid and H2 blockers in the past, also left upper quadrant pain for a few years which comes and goes.  Also chronic altered bowel habits.  She was scheduled for an EGD as below and recommended MiraLAX twice daily.  She declined Linzess.    07/19/2020 EGD done for eructations and upper abdominal pain.  Normal esophagus, normal stomach, duodenal diverticulum x2, recommended trial Protonix for a full 4 weeks daily to see if any improvement.  Pathology showed gastric antrum with hyperemia and otherwise normal.    07/22/2020 patient called and described that she had ongoing symptoms since her EGD, experiencing faintness/diarrhea/lower abdominal pain.    Today, the patient tells me that she did not have any acute symptoms after EGD.  Tells me that she only experienced a little bit of nausea and felt the food going down for a couple of days but all that passed.  Tells me that she is here to discuss a chronic concern that she has had of left lower quadrant pain and constipation.  This has been going on for "years".  Tells me she was diagnosed with IBS.  Describes that recently she got constipated for a couple of days and had an episode that lasted for about an hour where she had severe lower abdominal pain which radiated into her back and got nauseous and dizzy when trying to pass a stool and felt like she was going to faint.  She ended up having at least 6 bowel movements and the last was slightly loose with some mucus and after doing this the pain  seemed to be more relieved.  She had another good bowel movement this morning.  Tells me these episodes will occur every once in a while where she becomes constipated.  She has been using MiraLAX daily but does not feel like it is really helping over the past for 5 days.  Describes being on Linzess in the past which caused "burping" which was so severe she stopped the medication.  Tells me she does not really like medicine.    Also continues with intermittent reflux symptoms, but has only been on her Pantoprazole 40 mg daily for 3 days now.    Does ask at time of her office visit if people ever get on disability for symptoms like this.  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    Denies fever, chills or blood in her stool.  Prior work-up Colonoscopy 09/2016 - normal exam, external hemorrhoids -   [place] 01/15/19 - gallstones  HIDA - 01/24/19 - normal  CT abdomen / pelvis 08/18/17 - no acute changes, fatty atrophy of pancreatic head  Stool occult blood 05/17/20 - negative Celiac panel negative 09/23/19    Past Medical History:  Diagnosis Date  . Anxiety   . Back pain   . Constipation   . Gallstones   . GERD (gastroesophageal reflux disease)   . High cholesterol   . High triglycerides   .  History of sinus problem   . Hypertension   . IBS (irritable bowel syndrome)   . Joint pain   . LLQ pain 08/19/2014  . Plantar fasciitis   . SOB (shortness of breath)     Past Surgical History:  Procedure Laterality Date  . COLONOSCOPY N/A 09/06/2016   Procedure: COLONOSCOPY;  Surgeon: Malissa Hippo, MD;  Location: [facility];  Service: Endoscopy;  Laterality: N/A;  8:30  . FLEXIBLE SIGMOIDOSCOPY      Current Outpatient Medications  Medication Sig Dispense Refill  . ALPRAZolam (XANAX) 0.5 MG tablet Take 0.5 mg by mouth daily as needed.    . hydrochlorothiazide (HYDRODIURIL) 25 MG tablet Take 25 mg by mouth daily.    . pantoprazole  (PROTONIX) 40 MG tablet Take 1 tablet (40 mg)once to twice daily 60 tablet 3  . polyethylene glycol (MIRALAX) 17 g packet Take as directed twice and increase as needed 14 each 0  . Vitamin D, Ergocalciferol, (DRISDOL) 1.25 MG (50000 UNIT) CAPS capsule Take 1 capsule (50,000 Units total) by mouth every 7 (seven) days. 4 capsule 0   No current facility-administered medications for this visit.    Allergies as of 07/23/2020 - Review Complete 07/20/2020  Allergen Reaction Noted  . Shrimp [shellfish allergy] Nausea And Vomiting 08/17/2017  . Sulfa antibiotics  05/17/2020    Family History  Problem Relation Age of Onset  . Lung cancer Mother   . Anxiety disorder Mother   . Alcoholism Mother   . Heart attack Father   . Sudden death Father   . Diverticulitis Paternal Grandmother   . Diverticulitis Brother        died from septic shock  . Breast cancer Daughter 63  . COPD Brother   . Colon cancer Neg Hx     Social History   Socioeconomic History  . Marital status: Divorced    Spouse name: Not on file  . Number of children: 4  . Years of education: Not on file  . Highest education level: Not on file  Occupational History  . Occupation: retired  Tobacco Use  . Smoking status: Never Smoker  . Smokeless tobacco: Never Used  Vaping Use  . Vaping Use: Never used  Substance and Sexual Activity  . Alcohol use: Yes    Comment: "Once every 2 months"  . Drug use: No  . Sexual activity: Yes    Birth control/protection: Post-menopausal  Other Topics Concern  . Not on file  Social History Narrative  . Not on file   Social Determinants of Health   Financial Resource Strain: Low Risk   . Difficulty of Paying Living Expenses: Not hard at all  Food Insecurity: No Food Insecurity  . Worried About Programme researcher, broadcasting/film/video in the Last Year: Never true  . Ran Out of Food in the Last Year: Never true  Transportation Needs: No Transportation Needs  . Lack of Transportation (Medical): No  .  Lack of Transportation (Non-Medical): No  Physical Activity: Sufficiently Active  . Days of Exercise per Week: 3 days  . Minutes of Exercise per Session: 50 min  Stress: No Stress Concern Present  . Feeling of Stress : Not at all  Social Connections: Moderately Integrated  . Frequency of Communication with Friends and Family: More than three times a week  . Frequency of Social Gatherings with Friends and Family: More than three times a week  . Attends Religious Services: More than 4 times per year  .  Active Member of Clubs or Organizations: Yes  . Attends Banker Meetings: Never  . Marital Status: Divorced  Catering manager Violence: Not At Risk  . Fear of Current or Ex-Partner: No  . Emotionally Abused: No  . Physically Abused: No  . Sexually Abused: No    Review of Systems:    Constitutional: No weight loss, fever or chills Cardiovascular: No chest pain Respiratory: No SOB  Gastrointestinal: See HPI and otherwise negative   Physical Exam:  Vital signs: BP 124/84   Pulse 72   Ht 5\' 4"  (1.626 m)   Wt 167 lb (75.8 kg)   BMI 28.67 kg/m   Constitutional:   Pleasant Caucasian female appears to be in NAD, Well developed, Well nourished, alert and cooperative Respiratory: Respirations even and unlabored. Lungs clear to auscultation bilaterally.   No wheezes, crackles, or rhonchi.  Cardiovascular: Normal S1, S2. No MRG. Regular rate and rhythm. No peripheral edema, cyanosis or pallor.  Gastrointestinal:  Soft, nondistended, mild b/l lower abdominal ttp. No rebound or guarding. Normal bowel sounds. No appreciable masses or hepatomegaly. Rectal:  Not performed.  Psychiatric: Demonstrates good judgement and reason without abnormal affect or behaviors.  No recent labs.  Assessment: 1.  Chronic lower abdominal pain and constipation: Apparently had tried Linzess in the past which caused terrible burping so she discontinued it, currently using MiraLAX but had a severe  episode of constipation a couple of days ago so does not believe it is working; likely related to IBS as discussed in the past 2.  GERD: Recent EGD was normal, patient recommended to use Protonix daily for 4 weeks to see if it helped  Plan: 1.  Recommend the patient start Amitiza 8 mcg twice daily with food.  Prescribed #60 with 5 refills. 2.  Recommend the patient continue her Pantoprazole 40 mg daily as recommended by Dr. . 3.  Briefly discussed with the patient that her symptoms are not really severe enough or frequent enough for her to be on disability, it would certainly take her failing all appropriate therapies before this would be a possibility, if it is at all. 4.  Briefly discussed antidepressant/antianxiety medicine to help with IBS symptoms.  She can further discuss this with Dr. Adela Lank if the regimen above does not help her. 5.  Patient to follow in clinic with Dr. Adela Lank in 2 months.  Adela Lank, PA-C [facility] 07/23/2020, 3:14 PM  Cc: 07/25/2020, FNP

## 2020-07-26 ENCOUNTER — Telehealth: Payer: Self-pay | Admitting: Physician Assistant

## 2020-07-26 MED ORDER — TEGASEROD MALEATE 6 MG PO TABS: 6.0000 mg | ORAL_TABLET | Freq: Two times a day (BID) | ORAL | 2 refills | Status: DC

## 2020-07-26 NOTE — Telephone Encounter (Signed)
Called patient and let her know we sent in zelnorm 6mg  twice daily 30 min before meals. Patient stated she would try the Zelnorm.

## 2020-07-26 NOTE — Telephone Encounter (Signed)
Jennifer please advise?  

## 2020-07-26 NOTE — Telephone Encounter (Signed)
How about we try Zelnorm  6mg  po bid- 30 min before meals, prescribe #60 refillx2

## 2020-08-02 ENCOUNTER — Other Ambulatory Visit: Payer: Self-pay

## 2020-08-02 ENCOUNTER — Ambulatory Visit (INDEPENDENT_AMBULATORY_CARE_PROVIDER_SITE_OTHER): Payer: 59 | Admitting: Bariatrics

## 2020-08-02 ENCOUNTER — Encounter (INDEPENDENT_AMBULATORY_CARE_PROVIDER_SITE_OTHER): Payer: Self-pay | Admitting: Bariatrics

## 2020-08-02 VITALS — BP 121/82 | HR 72 | Temp 98.4°F | Ht 64.0 in | Wt 162.0 lb

## 2020-08-02 DIAGNOSIS — I10 Essential (primary) hypertension: Secondary | ICD-10-CM

## 2020-08-02 DIAGNOSIS — K588 Other irritable bowel syndrome: Secondary | ICD-10-CM

## 2020-08-02 DIAGNOSIS — E669 Obesity, unspecified: Secondary | ICD-10-CM

## 2020-08-02 DIAGNOSIS — Z6830 Body mass index (BMI) 30.0-30.9, adult: Secondary | ICD-10-CM

## 2020-08-02 DIAGNOSIS — K219 Gastro-esophageal reflux disease without esophagitis: Secondary | ICD-10-CM

## 2020-08-02 NOTE — Progress Notes (Signed)
Chief Complaint:   OBESITY Victoria Page is here to discuss her progress with her obesity treatment plan along with follow-up of her obesity related diagnoses. [name] is on the Category 2 Plan and states she is following her eating plan approximately 85% of the time. [name] states she is exercising 0 minutes 0 times per week.  Today's visit was #: 11 Starting weight: 175 lbs Starting date: 02/18/2020 Today's weight: 162 lbs Today's date: 08/02/2020 Total lbs lost to date: 13 Total lbs lost since last in-office visit: 0  Interim History: [name]'s weight remains the same. She states that she is having more episodes of IBS. She reports doing well with her water intake.  Subjective:   Gastroesophageal reflux disease, unspecified whether esophagitis present. Ruari is taking Protonix.  Essential hypertension. [name] is taking HCTZ.  BP Readings from Last 3 Encounters:  08/02/20 121/82  07/23/20 124/84  07/20/20 136/83   Lab Results  Component Value Date   CREATININE 0.7 05/27/2020   CREATININE 0.55 (L) 02/18/2020   CREATININE 0.80 09/18/2019   Other irritable bowel syndrome. [name] is taking Linzess. She endorses stomach cramping.  Assessment/Plan:   Gastroesophageal reflux disease, unspecified whether esophagitis present. Intensive lifestyle modifications are the first line treatment for this issue. We discussed several lifestyle modifications today and she will continue to work on diet, exercise and weight loss efforts. Orders and follow up as documented in patient record. [name] will continue Protonix as directed, avoid food triggers, and use Glycerin suppositories if needed.  Counseling . If a person has gastroesophageal reflux disease (GERD), food and stomach acid move back up into the esophagus and cause symptoms or problems such as damage to the esophagus. . Anti-reflux measures include: raising the head of the bed, avoiding tight clothing or belts,  avoiding eating late at night, not lying down shortly after mealtime, and achieving weight loss. . Avoid ASA, NSAID's, caffeine, alcohol, and tobacco.  . OTC Pepcid and/or Tums are often very helpful for as needed use.  Marland Kitchen However, for persisting chronic or daily symptoms, stronger medications like Omeprazole may be needed. . You may need to avoid foods and drinks such as: ? Coffee and tea (with or without caffeine). ? Drinks that contain alcohol. ? Energy drinks and sports drinks. ? Bubbly (carbonated) drinks or sodas. ? Chocolate and cocoa. ? Peppermint and mint flavorings. ? Garlic and onions. ? Horseradish. ? Spicy and acidic foods. These include peppers, chili powder, curry powder, vinegar, hot sauces, and BBQ sauce. ? Citrus fruit juices and citrus fruits, such as oranges, lemons, and limes. ? Tomato-based foods. These include red sauce, chili, salsa, and pizza with red sauce. ? Fried and fatty foods. These include donuts, french fries, potato chips, and high-fat dressings. ? High-fat meats. These include hot dogs, rib eye steak, sausage, ham, and bacon.  Essential hypertension. [name] is working on healthy weight loss and exercise to improve blood pressure control. We will watch for signs of hypotension as she continues her lifestyle modifications. She will continue HCTZ as directed.   Other irritable bowel syndrome. [name] will talk about increasing the dose of Linzess with GI. She will use Glycerin suppositories if needed.  Class 1 obesity with serious comorbidity and body mass index (BMI) of 30.0 to 30.9 in adult, unspecified obesity type - BMI greater than 30 at start.  [name] is currently in the action stage of change. As such, her goal is to continue with weight loss efforts. She has agreed  to the Category 2 Plan.   She will stay adherent to the meal plan.  Exercise goals: All adults should avoid inactivity. Some physical activity is better than none, and adults who  participate in any amount of physical activity gain some health benefits.  Behavioral modification strategies: increasing lean protein intake, decreasing simple carbohydrates, increasing vegetables, increasing water intake, decreasing liquid calories, decreasing eating out, no skipping meals, meal planning and cooking strategies, keeping healthy foods in the home and planning for success.  [name] has agreed to follow-up with our clinic in 2-3 weeks. She was informed of the importance of frequent follow-up visits to maximize her success with intensive lifestyle modifications for her multiple health conditions.   Objective:   Blood pressure 121/82, pulse 72, temperature 98.4 F (36.9 C), height 5\' 4"  (1.626 m), weight 162 lb (73.5 kg), SpO2 97 %. Body mass index is 27.81 kg/m.  General: Cooperative, alert, well developed, in no acute distress. HEENT: Conjunctivae and lids unremarkable. Cardiovascular: Regular rhythm.  Lungs: Normal work of breathing. Neurologic: No focal deficits.   Lab Results  Component Value Date   CREATININE 0.7 05/27/2020   BUN 14 05/27/2020   NA 140 05/27/2020   K 4.7 05/27/2020   CL 101 05/27/2020   CO2 28 (A) 05/27/2020   Lab Results  Component Value Date   ALT 12 02/18/2020   AST 14 02/18/2020   ALKPHOS 103 02/18/2020   BILITOT 0.6 02/18/2020   Lab Results  Component Value Date   HGBA1C 5.3 02/18/2020   Lab Results  Component Value Date   INSULIN 9.9 07/05/2020   INSULIN 11.4 02/18/2020   Lab Results  Component Value Date   TSH 1.830 02/18/2020   Lab Results  Component Value Date   CHOL 244 (A) 05/27/2020   HDL 61 05/27/2020   LDLCALC 160 05/27/2020   TRIG 127 05/27/2020   CHOLHDL 4.5 07/29/2019   Lab Results  Component Value Date   WBC 5.5 09/18/2019   HGB 15.4 09/18/2019   HCT 45.6 (H) 09/18/2019   MCV 89.8 09/18/2019   PLT 242 09/18/2019   No results found for: IRON, TIBC, FERRITIN  Attestation Statements:   Reviewed by  clinician on day of visit: allergies, medications, problem list, medical history, surgical history, family history, social history, and previous encounter notes.  Time spent on visit including pre-visit chart review and post-visit charting and care was 20 minutes.   09/20/2019, am acting as Fernanda Drum for Energy manager, DO   I have reviewed the above documentation for accuracy and completeness, and I agree with the above. [employer], DO

## 2020-08-03 ENCOUNTER — Encounter (INDEPENDENT_AMBULATORY_CARE_PROVIDER_SITE_OTHER): Payer: Self-pay | Admitting: Bariatrics

## 2020-08-04 ENCOUNTER — Other Ambulatory Visit (HOSPITAL_COMMUNITY): Payer: Self-pay | Admitting: Family Medicine

## 2020-08-04 DIAGNOSIS — Z1231 Encounter for screening mammogram for malignant neoplasm of breast: Secondary | ICD-10-CM

## 2020-08-04 DIAGNOSIS — Z780 Asymptomatic menopausal state: Secondary | ICD-10-CM

## 2020-08-06 MED ORDER — LINACLOTIDE 145 MCG PO CAPS: 145.0000 ug | ORAL_CAPSULE | Freq: Every day | ORAL | 3 refills | Status: DC

## 2020-08-06 NOTE — Telephone Encounter (Signed)
Patient called back stating Zelnorm med was too expensive for her.  She restarted Linzess again and now is taking it at 4am and eating at 6am and she said the burping has gone away.  Would like to continue on Linzess and is requesting a prescription be sent to her pharmacy in epic.

## 2020-08-06 NOTE — Telephone Encounter (Signed)
Sent Linzess to patient's pharmacy.

## 2020-08-06 NOTE — Addendum Note (Signed)
Addended by: Justice Britain on: 08/06/2020 03:58 PM   Modules accepted: Orders

## 2020-08-06 NOTE — Telephone Encounter (Signed)
That is perfectly fine, 30 day prescription with 11 refills.  Thx-JLL

## 2020-08-06 NOTE — Telephone Encounter (Signed)
Is it ok to send in linzess

## 2020-08-24 ENCOUNTER — Other Ambulatory Visit (INDEPENDENT_AMBULATORY_CARE_PROVIDER_SITE_OTHER): Payer: 59 | Admitting: *Deleted

## 2020-08-24 ENCOUNTER — Other Ambulatory Visit: Payer: Self-pay

## 2020-08-24 DIAGNOSIS — R350 Frequency of micturition: Secondary | ICD-10-CM

## 2020-08-24 LAB — POCT URINALYSIS DIPSTICK OB
Blood, UA: NEGATIVE
Glucose, UA: NEGATIVE
Leukocytes, UA: NEGATIVE
Nitrite, UA: NEGATIVE
POC,PROTEIN,UA: NEGATIVE

## 2020-08-24 MED ORDER — NITROFURANTOIN MONOHYD MACRO 100 MG PO CAPS: 100.0000 mg | ORAL_CAPSULE | Freq: Two times a day (BID) | ORAL | 0 refills | Status: DC

## 2020-08-24 NOTE — Progress Notes (Signed)
Chart reviewed for nurse visit. Agree with plan of care.will rx macrobid Adline Potter, NP 08/24/2020 10:54 AM

## 2020-08-24 NOTE — Addendum Note (Signed)
Addended by: Cyril Mourning A on: 08/24/2020 10:55 AM   Modules accepted: Orders

## 2020-08-24 NOTE — Progress Notes (Signed)
   NURSE VISIT- UTI SYMPTOMS   SUBJECTIVE:  Victoria Page is a 64 y.o. [id number] female here for UTI symptoms. She is a GYN patient. She reports dysuria and urinary frequency.  OBJECTIVE:  There were no vitals taken for this visit.  Appears well, in no apparent distress  Results for orders placed or performed in visit on 08/24/20 (from the past 24 hour(s))  POC Urinalysis Dipstick OB   Collection Time: 08/24/20  8:45 AM  Result Value Ref Range   Color, UA     Clarity, UA     Glucose, UA Negative Negative   Bilirubin, UA     Ketones, UA small    Spec Grav, UA     Blood, UA neg    pH, UA     POC,PROTEIN,UA Negative Negative, Trace, Small (1+), Moderate (2+), Large (3+), 4+   Urobilinogen, UA     Nitrite, UA neg    Leukocytes, UA Negative Negative   Appearance     Odor      ASSESSMENT: GYN patient with UTI symptoms and negative nitrites  PLAN: Note routed to Cyril Mourning, AGNP   Rx sent by provider today: No Urine culture sent Call or return to clinic prn if these symptoms worsen or fail to improve as anticipated. Follow-up: as needed   Annamarie Dawley  08/24/2020 8:46 AM

## 2020-08-25 ENCOUNTER — Ambulatory Visit (HOSPITAL_COMMUNITY)
Admission: RE | Admit: 2020-08-25 | Discharge: 2020-08-25 | Disposition: A | Discharge status: Home / Self Care | Payer: 59 | Source: Ambulatory Visit | Attending: Family Medicine | Admitting: Family Medicine

## 2020-08-25 ENCOUNTER — Other Ambulatory Visit: Payer: Self-pay

## 2020-08-25 ENCOUNTER — Other Ambulatory Visit (HOSPITAL_COMMUNITY): Payer: 59

## 2020-08-25 ENCOUNTER — Ambulatory Visit (HOSPITAL_COMMUNITY): Payer: 59

## 2020-08-25 DIAGNOSIS — Z1231 Encounter for screening mammogram for malignant neoplasm of breast: Secondary | ICD-10-CM

## 2020-08-25 DIAGNOSIS — Z780 Asymptomatic menopausal state: Secondary | ICD-10-CM | POA: Diagnosis not present

## 2020-08-26 LAB — URINE CULTURE

## 2020-09-07 ENCOUNTER — Ambulatory Visit (INDEPENDENT_AMBULATORY_CARE_PROVIDER_SITE_OTHER): Payer: 59 | Admitting: Bariatrics

## 2020-09-07 ENCOUNTER — Encounter (INDEPENDENT_AMBULATORY_CARE_PROVIDER_SITE_OTHER): Payer: Self-pay | Admitting: Bariatrics

## 2020-09-07 ENCOUNTER — Other Ambulatory Visit: Payer: Self-pay

## 2020-09-07 VITALS — BP 121/82 | HR 68 | Temp 97.9°F | Ht 64.0 in | Wt 164.0 lb

## 2020-09-07 DIAGNOSIS — I10 Essential (primary) hypertension: Secondary | ICD-10-CM

## 2020-09-07 DIAGNOSIS — K219 Gastro-esophageal reflux disease without esophagitis: Secondary | ICD-10-CM | POA: Diagnosis not present

## 2020-09-07 DIAGNOSIS — E7800 Pure hypercholesterolemia, unspecified: Secondary | ICD-10-CM | POA: Diagnosis not present

## 2020-09-07 DIAGNOSIS — E559 Vitamin D deficiency, unspecified: Secondary | ICD-10-CM | POA: Diagnosis not present

## 2020-09-07 DIAGNOSIS — Z6830 Body mass index (BMI) 30.0-30.9, adult: Secondary | ICD-10-CM

## 2020-09-07 DIAGNOSIS — E669 Obesity, unspecified: Secondary | ICD-10-CM

## 2020-09-08 ENCOUNTER — Encounter (INDEPENDENT_AMBULATORY_CARE_PROVIDER_SITE_OTHER): Payer: Self-pay | Admitting: Bariatrics

## 2020-09-08 LAB — VITAMIN D 25 HYDROXY (VIT D DEFICIENCY, FRACTURES): Vit D, 25-Hydroxy: 40.4 ng/mL (ref 30.0–100.0)

## 2020-09-08 NOTE — Progress Notes (Signed)
Chief Complaint:   Victoria Page is here to discuss her progress with her obesity treatment plan along with follow-up of her obesity related diagnoses. Victoria Page is on the Category 2 Plan and states she is following her eating plan approximately 70% of the time. Victoria Page states she is not exercising at this time.  Today's visit was #: 12 Starting weight: 175 lbs Starting date: 02/18/2020 Today's weight: 164 lbs Today's date: 09/07/2020 Total lbs lost to date: 11 lbs Total lbs lost since last in-office visit: 0  Interim History: Victoria Page is up 2 pounds and doing well.  She ate more cheese and crackers.  She has had less water.    Subjective:   1. Essential hypertension Review: taking medications as instructed, no medication side effects noted, no chest pain on exertion, no dyspnea on exertion, no swelling of ankles.  Victoria Page is taking HCTZ 25 mg daily.  BP Readings from Last 3 Encounters:  09/07/20 121/82  08/02/20 121/82  07/23/20 124/84   2. Gastroesophageal reflux disease, unspecified whether esophagitis present Victoria Page takes Protonix 40 mg twice daily for GERD.  3. Vitamin D deficiency Victoria Page's Vitamin D level was 53.4 on 07/05/2020. She is currently taking OTC vitamin D. She denies nausea, vomiting or muscle weakness.  4. Elevated cholesterol Worsening.    Victoria Page has elevated cholesterol and has been trying to improve her cholesterol levels with intensive lifestyle modification including a low saturated fat diet, exercise and weight loss. She denies any chest pain, claudication or myalgias.  She is taking lemon grass and cinnamon bark.  HDL is elevated and LDL is low.  She is not taking a statin.  Lab Results  Component Value Date   ALT 12 02/18/2020   AST 14 02/18/2020   ALKPHOS 103 02/18/2020   BILITOT 0.6 02/18/2020   Lab Results  Component Value Date   CHOL 244 (A) 05/27/2020   HDL 61 05/27/2020   LDLCALC 160 05/27/2020   TRIG 127 05/27/2020    CHOLHDL 4.5 07/29/2019   Assessment/Plan:   1. Essential hypertension Victoria Page is working on healthy weight loss and exercise to improve blood pressure control. We will watch for signs of hypotension as she continues her lifestyle modifications.  She will continue her blood pressure medication.  2. Gastroesophageal reflux disease, unspecified whether esophagitis present Intensive lifestyle modifications are the first line treatment for this issue. We discussed several lifestyle modifications today and she will continue to work on diet, exercise and weight loss efforts. Orders and follow up as documented in patient record.  Continue Protonix.  Counseling . If a person has gastroesophageal reflux disease (GERD), food and stomach acid move back up into the esophagus and cause symptoms or problems such as damage to the esophagus. . Anti-reflux measures include: raising the head of the bed, avoiding tight clothing or belts, avoiding eating late at night, not lying down shortly after mealtime, and achieving weight loss. . Avoid ASA, NSAID's, caffeine, alcohol, and tobacco.  . OTC Pepcid and/or Tums are often very helpful for as needed use.  Victoria Page Kitchen However, for persisting chronic or daily symptoms, stronger medications like Omeprazole may be needed. . You may need to avoid foods and drinks such as: ? Coffee and tea (with or without caffeine). ? Drinks that contain alcohol. ? Energy drinks and sports drinks. ? Bubbly (carbonated) drinks or sodas. ? Chocolate and cocoa. ? Peppermint and mint flavorings. ? Garlic and onions. ? Horseradish. ? Spicy and acidic foods. These include peppers,  chili powder, curry powder, vinegar, hot sauces, and BBQ sauce. ? Citrus fruit juices and citrus fruits, such as oranges, lemons, and limes. ? Tomato-based foods. These include red sauce, chili, salsa, and pizza with red sauce. ? Fried and fatty foods. These include donuts, french fries, potato chips, and high-fat  dressings. ? High-fat meats. These include hot dogs, rib eye steak, sausage, ham, and bacon.  3. Vitamin D deficiency Low Vitamin D level contributes to fatigue and are associated with obesity, breast, and colon cancer. She agrees to continue to take OTC Vitamin D daily and we will check her vitamin D level today.  - VITAMIN D 25 Hydroxy (Vit-D Deficiency, Fractures)  4. Elevated cholesterol Cardiovascular risk and specific lipid/LDL goals reviewed.  We discussed several lifestyle modifications today and Victoria Page will continue to work on diet, exercise and weight loss efforts.  She will continue her supplement regimen, as above.  Counseling Intensive lifestyle modifications are the first line treatment for this issue. . Dietary changes: Increase soluble fiber. Decrease simple carbohydrates. . Exercise changes: Moderate to vigorous-intensity aerobic activity 150 minutes per week if tolerated. . Lipid-lowering medications: see documented in medical record.  5. Class 1 obesity with serious comorbidity and body mass index (BMI) of 30.0 to 30.9 in adult, unspecified obesity type  Victoria Page is currently in the action stage of change. As such, her goal is to continue with weight loss efforts. She has agreed to the Category 2 Plan.   She will work on meal planning and intentional eating.  Labs were reviewed independently and with the patient and include BMP, lipid panel, and glucose level from 08/24/2020.  She will stop eating crackers.  Exercise goals: All adults should avoid inactivity. Some physical activity is better than none, and adults who participate in any amount of physical activity gain some health benefits.  Behavioral modification strategies: increasing lean protein intake, decreasing simple carbohydrates, increasing vegetables, increasing water intake, decreasing eating out, no skipping meals, meal planning and cooking strategies, keeping healthy foods in the home and planning for  success.  Victoria Page has agreed to follow-up with our clinic in 2-3 weeks. She was informed of the importance of frequent follow-up visits to maximize her success with intensive lifestyle modifications for her multiple health conditions.   Victoria Page was informed we would discuss her lab results at her next visit unless there is a critical issue that needs to be addressed sooner. Victoria Page agreed to keep her next visit at the agreed upon time to discuss these results.  Objective:   Blood pressure 121/82, pulse 68, temperature 97.9 F (36.6 C), temperature source Oral, height 5\' 4"  (1.626 m), weight 164 lb (74.4 kg), SpO2 98 %. Body mass index is 28.15 kg/m.  General: Cooperative, alert, well developed, in no acute distress. HEENT: Conjunctivae and lids unremarkable. Cardiovascular: Regular rhythm.  Lungs: Normal work of breathing. Neurologic: No focal deficits.   Lab Results  Component Value Date   CREATININE 0.7 05/27/2020   BUN 14 05/27/2020   NA 140 05/27/2020   K 4.7 05/27/2020   CL 101 05/27/2020   CO2 28 (A) 05/27/2020   Lab Results  Component Value Date   ALT 12 02/18/2020   AST 14 02/18/2020   ALKPHOS 103 02/18/2020   BILITOT 0.6 02/18/2020   Lab Results  Component Value Date   HGBA1C 5.3 02/18/2020   Lab Results  Component Value Date   INSULIN 9.9 07/05/2020   INSULIN 11.4 02/18/2020   Lab Results  Component  Value Date   TSH 1.830 02/18/2020   Lab Results  Component Value Date   CHOL 244 (A) 05/27/2020   HDL 61 05/27/2020   LDLCALC 160 05/27/2020   TRIG 127 05/27/2020   CHOLHDL 4.5 07/29/2019   Lab Results  Component Value Date   WBC 5.5 09/18/2019   HGB 15.4 09/18/2019   HCT 45.6 (H) 09/18/2019   MCV 89.8 09/18/2019   PLT 242 09/18/2019   Attestation Statements:   Reviewed by clinician on day of visit: allergies, medications, problem list, medical history, surgical history, family history, social history, and previous encounter notes.  I, Comptroller, CMA, am acting as Energy manager for [employer], DO  I have reviewed the above documentation for accuracy and completeness, and I agree with the above. Corinna Capra, DO

## 2020-09-10 ENCOUNTER — Other Ambulatory Visit: Payer: Self-pay

## 2020-09-10 ENCOUNTER — Encounter: Payer: Self-pay | Admitting: Adult Health

## 2020-09-10 ENCOUNTER — Ambulatory Visit (INDEPENDENT_AMBULATORY_CARE_PROVIDER_SITE_OTHER): Payer: 59 | Admitting: Adult Health

## 2020-09-10 VITALS — BP 138/92 | HR 64 | Temp 98.2°F | Ht 64.2 in | Wt 167.4 lb

## 2020-09-10 DIAGNOSIS — R350 Frequency of micturition: Secondary | ICD-10-CM | POA: Diagnosis not present

## 2020-09-10 DIAGNOSIS — N8111 Cystocele, midline: Secondary | ICD-10-CM | POA: Insufficient documentation

## 2020-09-10 DIAGNOSIS — R102 Pelvic and perineal pain: Secondary | ICD-10-CM

## 2020-09-10 LAB — POCT URINALYSIS DIPSTICK OB
Blood, UA: NEGATIVE
Glucose, UA: NEGATIVE
Leukocytes, UA: NEGATIVE
Nitrite, UA: NEGATIVE

## 2020-09-10 MED ORDER — URIBEL 118 MG PO CAPS: ORAL_CAPSULE | ORAL | 0 refills | Status: DC

## 2020-09-10 NOTE — Progress Notes (Signed)
  Subjective:     Patient ID: Victoria Page, female   DOB: Oct 20, 1955, 65 y.o.   MRN: [id number]  HPI Victoria Page is a 65 year old white female, married,PM in complaining of heaviness in pelvic area and urinary frequency, feels like UTI. PCP is [facility] Marland Kitchen  Review of Systems  +heaviness/pain in pelvic area,L>R +Urinary frequency  Some low back pain Reviewed past medical,surgical, social and family history. Reviewed medications and allergies.     Objective:   Physical Exam BP (!) 138/92 (BP Location: Right Arm, Patient Position: Sitting, Cuff Size: Large)   Pulse 64   Temp 98.2 F (36.8 C)   Ht 5' 4.2" (1.631 m)   Wt 167 lb 6.4 oz (75.9 kg)   BMI 28.56 kg/m urine dipstick negative. Skin warm and dry.Pelvic: external genitalia is normal in appearance no lesions, vagina: pale pink, has cystocele,urethra has no lesions or masses noted, cervix:smooth, uterus: normal size, shape and contour, non tender, no masses felt, adnexa: no masses,+mild to the left tenderness noted. Bladder is non tender and no masses felt.      [facility] - 09/10/20 1143      Pregnancy Intention Screening   Does the patient want to become pregnant in the next year? No    Does the patient's partner want to become pregnant in the next year? No    Would the patient like to discuss contraceptive options today? No      Contraception Wrap Up   Current Method No Method - Other Reason   PM   End Method No Method - Other Reason   PM   Contraception Counseling Provided No         Examination chaperoned by Federico Flake CMA  Assessment:     1. Pelvic pain Will get pelvic US at [facility] 09/15/20 at 1:30 pm  2. Urinary frequency Will try uribel to see if helps, could be IC  Meds ordered this encounter  Medications  . Meth-Hyo-M Bl-Na Phos-Ph Sal (URIBEL) 118 MG CAPS    Sig: Take 1 tid for 10 days    Dispense:  10 capsule    Refill:  0    Order Specific Question:   Supervising Provider    Answer:    Despina Hidden, LUTHER H [[id number]]  UA C&S sent  3. Pelvic relaxation due to cystocele, midline     Plan:    -will follow up in 2 weeks

## 2020-09-11 LAB — MICROSCOPIC EXAMINATION
Bacteria, UA: NONE SEEN
Casts: NONE SEEN /lpf
Epithelial Cells (non renal): NONE SEEN /hpf (ref 0–10)
RBC, Urine: NONE SEEN /hpf (ref 0–2)
WBC, UA: NONE SEEN /hpf (ref 0–5)

## 2020-09-11 LAB — URINALYSIS, ROUTINE W REFLEX MICROSCOPIC
Bilirubin, UA: NEGATIVE
Glucose, UA: NEGATIVE
Ketones, UA: NEGATIVE
Nitrite, UA: NEGATIVE
Protein,UA: NEGATIVE
RBC, UA: NEGATIVE
Specific Gravity, UA: 1.011 (ref 1.005–1.030)
Urobilinogen, Ur: 0.2 mg/dL (ref 0.2–1.0)
pH, UA: 6.5 (ref 5.0–7.5)

## 2020-09-12 LAB — URINE CULTURE: Organism ID, Bacteria: NO GROWTH

## 2020-09-15 ENCOUNTER — Ambulatory Visit (HOSPITAL_COMMUNITY)
Admission: RE | Admit: 2020-09-15 | Discharge: 2020-09-15 | Disposition: A | Discharge status: Home / Self Care | Payer: 59 | Source: Ambulatory Visit | Attending: Adult Health | Admitting: Adult Health

## 2020-09-15 ENCOUNTER — Other Ambulatory Visit: Payer: Self-pay

## 2020-09-15 DIAGNOSIS — R102 Pelvic and perineal pain: Secondary | ICD-10-CM | POA: Diagnosis not present

## 2020-09-21 ENCOUNTER — Telehealth (INDEPENDENT_AMBULATORY_CARE_PROVIDER_SITE_OTHER): Payer: 59 | Admitting: Bariatrics

## 2020-09-21 ENCOUNTER — Encounter (INDEPENDENT_AMBULATORY_CARE_PROVIDER_SITE_OTHER): Payer: Self-pay | Admitting: Bariatrics

## 2020-09-21 DIAGNOSIS — Z6830 Body mass index (BMI) 30.0-30.9, adult: Secondary | ICD-10-CM | POA: Diagnosis not present

## 2020-09-21 DIAGNOSIS — E6609 Other obesity due to excess calories: Secondary | ICD-10-CM | POA: Diagnosis not present

## 2020-09-21 DIAGNOSIS — K219 Gastro-esophageal reflux disease without esophagitis: Secondary | ICD-10-CM | POA: Diagnosis not present

## 2020-09-21 DIAGNOSIS — E8881 Metabolic syndrome: Secondary | ICD-10-CM

## 2020-09-22 NOTE — Progress Notes (Unsigned)
TeleHealth Visit:  Due to the COVID-19 pandemic, this visit was completed with telemedicine (audio/video) technology to reduce patient and provider exposure as well as to preserve personal protective equipment.   Victoria Page has verbally consented to this TeleHealth visit. The patient is located at home, the provider is located at the [facility] office. The participants in this visit include the listed provider and patient. The visit was conducted today via MyChart video.  Chief Complaint: OBESITY Victoria Page is here to discuss her progress with her obesity treatment plan along with follow-up of her obesity related diagnoses. Victoria Page is on the Category 2 Plan and states she is following her eating plan approximately 75% of the time. Victoria Page states she is not exercising regularly at this time.  Today's visit was #: 13 Starting weight: 175 lbs Starting date: 02/18/2020  Interim History: Victoria Page states that she is down 1 pound.  She says she has lost her motivation.  Subjective:   1. Insulin resistance Victoria Page has a diagnosis of insulin resistance based on her elevated fasting insulin level >5. She continues to work on diet and exercise to decrease her risk of diabetes.  She is not taking any medications.  Lab Results  Component Value Date   INSULIN 9.9 07/05/2020   INSULIN 11.4 02/18/2020   Lab Results  Component Value Date   HGBA1C 5.3 02/18/2020   2. Gastroesophageal reflux disease, unspecified whether esophagitis present She is taking Protonix 40 mg once to twice daily.  Assessment/Plan:   1. Insulin resistance Victoria Page will continue to work on weight loss, exercise, and decreasing simple carbohydrates to help decrease the risk of diabetes. Victoria Page agreed to follow-up with [place] as directed to closely monitor her progress.  Decrease carbohydrates (sweets and starchy carbs).  Increase activity.  2. Gastroesophageal reflux disease, unspecified whether esophagitis  present Intensive lifestyle modifications are the first line treatment for this issue. We discussed several lifestyle modifications today and she will continue to work on diet, exercise and weight loss efforts. Orders and follow up as documented in patient record.  Continue medication.   Counseling . If a person has gastroesophageal reflux disease (GERD), food and stomach acid move back up into the esophagus and cause symptoms or problems such as damage to the esophagus. . Anti-reflux measures include: raising the head of the bed, avoiding tight clothing or belts, avoiding eating late at night, not lying down shortly after mealtime, and achieving weight loss. . Avoid ASA, NSAID's, caffeine, alcohol, and tobacco.  . OTC Pepcid and/or Tums are often very helpful for as needed use.  Marland Kitchen However, for persisting chronic or daily symptoms, stronger medications like Omeprazole may be needed. . You may need to avoid foods and drinks such as: ? Coffee and tea (with or without caffeine). ? Drinks that contain alcohol. ? Energy drinks and sports drinks. ? Bubbly (carbonated) drinks or sodas. ? Chocolate and cocoa. ? Peppermint and mint flavorings. ? Garlic and onions. ? Horseradish. ? Spicy and acidic foods. These include peppers, chili powder, curry powder, vinegar, hot sauces, and BBQ sauce. ? Citrus fruit juices and citrus fruits, such as oranges, lemons, and limes. ? Tomato-based foods. These include red sauce, chili, salsa, and pizza with red sauce. ? Fried and fatty foods. These include donuts, french fries, potato chips, and high-fat dressings. ? High-fat meats. These include hot dogs, rib eye steak, sausage, ham, and bacon.  3. Class 1 obesity due to excess calories with serious comorbidity and body mass  index (BMI) of 30.0 to 30.9 in adult  Victoria Page is currently in the action stage of change. As such, her goal is to continue with weight loss efforts. She has agreed to the Category 2 Plan.    She will work on meal planning, intentional eating, increasing her water intake, and recipes.  [facility], use hand weights (YouTube).  Behavioral modification strategies: increasing lean protein intake, decreasing simple carbohydrates, increasing vegetables, increasing water intake, decreasing eating out, no skipping meals, meal planning and cooking strategies, keeping healthy foods in the home and planning for success.  Victoria Page has agreed to follow-up with our clinic on 10/05/2020. She was informed of the importance of frequent follow-up visits to maximize her success with intensive lifestyle modifications for her multiple health conditions.  Objective:   VITALS: Per patient if applicable, see vitals. GENERAL: Alert and in no acute distress. CARDIOPULMONARY: No increased WOB. Speaking in clear sentences.  PSYCH: Pleasant and cooperative. Speech normal rate and rhythm. Affect is appropriate. Insight and judgement are appropriate. Attention is focused, linear, and appropriate.  NEURO: Oriented as arrived to appointment on time with no prompting.   Lab Results  Component Value Date   CREATININE 0.7 05/27/2020   BUN 14 05/27/2020   NA 140 05/27/2020   K 4.7 05/27/2020   CL 101 05/27/2020   CO2 28 (A) 05/27/2020   Lab Results  Component Value Date   ALT 12 02/18/2020   AST 14 02/18/2020   ALKPHOS 103 02/18/2020   BILITOT 0.6 02/18/2020   Lab Results  Component Value Date   HGBA1C 5.3 02/18/2020   Lab Results  Component Value Date   INSULIN 9.9 07/05/2020   INSULIN 11.4 02/18/2020   Lab Results  Component Value Date   TSH 1.830 02/18/2020   Lab Results  Component Value Date   CHOL 244 (A) 05/27/2020   HDL 61 05/27/2020   LDLCALC 160 05/27/2020   TRIG 127 05/27/2020   CHOLHDL 4.5 07/29/2019   Lab Results  Component Value Date   WBC 5.5 09/18/2019   HGB 15.4 09/18/2019   HCT 45.6 (H) 09/18/2019   MCV 89.8 09/18/2019   PLT 242  09/18/2019   Attestation Statements:   Reviewed by clinician on day of visit: allergies, medications, problem list, medical history, surgical history, family history, social history, and previous encounter notes.  I, Insurance claims handler, CMA, am acting as Energy manager for [employer], DO  I have reviewed the above documentation for accuracy and completeness, and I agree with the above. Corinna Capra, DO

## 2020-09-23 ENCOUNTER — Other Ambulatory Visit: Payer: Self-pay

## 2020-09-23 ENCOUNTER — Encounter: Payer: Self-pay | Admitting: Emergency Medicine

## 2020-09-23 ENCOUNTER — Encounter (INDEPENDENT_AMBULATORY_CARE_PROVIDER_SITE_OTHER): Payer: Self-pay | Admitting: Bariatrics

## 2020-09-23 ENCOUNTER — Ambulatory Visit
Admission: EM | Admit: 2020-09-23 | Discharge: 2020-09-23 | Disposition: A | Discharge status: Home / Self Care | Payer: 59 | Attending: Emergency Medicine | Admitting: Emergency Medicine

## 2020-09-23 DIAGNOSIS — R52 Pain, unspecified: Secondary | ICD-10-CM

## 2020-09-23 DIAGNOSIS — J029 Acute pharyngitis, unspecified: Secondary | ICD-10-CM | POA: Diagnosis not present

## 2020-09-23 DIAGNOSIS — Z1152 Encounter for screening for COVID-19: Secondary | ICD-10-CM

## 2020-09-23 LAB — POCT RAPID STREP A (OFFICE): Rapid Strep A Screen: NEGATIVE

## 2020-09-23 NOTE — Discharge Instructions (Signed)
  COVID testing ordered.  It will take between 2-7 days for test results.  Someone will contact you regarding abnormal results.    Use throat lozenges such as Halls, Cepacol or Vicks to soothe throat  Gargle with salty warm water daily use medications daily for symptom relief Use OTC medications like ibuprofen or tylenol as needed fever or pain Call or go to the ED if you have any new or worsening symptoms such as fever, worsening cough, shortness of breath, chest tightness, chest pain, turning blue, changes in mental status, etc..Marland Kitchen

## 2020-09-23 NOTE — ED Triage Notes (Addendum)
Sore throat, body aches since last weekend.  Negative pcr covid test this week.  Pt states she has a hx of strep.  Pt's boyfriend tested covid positive this week.

## 2020-09-23 NOTE — ED Provider Notes (Signed)
[facility]   [id number] 09/23/20 Arrival Time: 1900   CC: COVID symptoms  SUBJECTIVE: History from: patient.  Victoria Page is a 65 y.o. female who presented to the urgent care for complaint of sore throat, body ache that started past 5 to 6 days.  Denies sick exposure to COVID, flu or strep.  Denies recent travel.  Has tried OTC medication without relief.  Denies alleviating or aggravating factors.  Denies previous symptoms in the past.   Denies fever, chills, fatigue, sinus pain, rhinorrhea,  SOB, wheezing, chest pain, nausea, changes in bowel or bladder habits.     ROS: As per HPI.  All other pertinent ROS negative.     Past Medical History:  Diagnosis Date  . Anxiety   . Back pain   . Biliary colic   . Constipation   . Gallstones   . GERD (gastroesophageal reflux disease)   . High cholesterol   . High triglycerides   . History of sinus problem   . Hypertension   . IBS (irritable bowel syndrome)   . Joint pain   . LLQ pain 08/19/2014  . Plantar fasciitis   . SOB (shortness of breath)    Past Surgical History:  Procedure Laterality Date  . COLONOSCOPY N/A 09/06/2016   Procedure: COLONOSCOPY;  Surgeon: Malissa Hippo, MD;  Location: [facility];  Service: Endoscopy;  Laterality: N/A;  8:30  . FLEXIBLE SIGMOIDOSCOPY     Allergies  Allergen Reactions  . Shrimp [Shellfish Allergy] Nausea And Vomiting  . Sulfa Antibiotics    No current facility-administered medications on file prior to encounter.   Current Outpatient Medications on File Prior to Encounter  Medication Sig Dispense Refill  . ALPRAZolam (XANAX) 0.5 MG tablet Take 0.5 mg by mouth daily as needed. (Patient not taking: Reported on 09/10/2020)    . calcium-vitamin D (OSCAL WITH D) 500-200 MG-UNIT tablet Take 1 tablet by mouth.    . hydrochlorothiazide (HYDRODIURIL) 25 MG tablet Take 25 mg by mouth daily.    Marland Kitchen linaclotide (LINZESS) 145 MCG CAPS capsule Take 1 capsule (145 mcg total) by mouth  daily before breakfast. 90 capsule 3  . Meth-Hyo-M Bl-Na Phos-Ph Sal (URIBEL) 118 MG CAPS Take 1 tid for 10 days 10 capsule 0  . nitrofurantoin, macrocrystal-monohydrate, (MACROBID) 100 MG capsule Take 1 capsule (100 mg total) by mouth 2 (two) times daily. (Patient not taking: Reported on 09/10/2020) 14 capsule 0  . pantoprazole (PROTONIX) 40 MG tablet Take 1 tablet (40 mg)once to twice daily 60 tablet 5  . zinc gluconate 50 MG tablet Take 50 mg by mouth daily.     Social History   Socioeconomic History  . Marital status: Divorced    Spouse name: Not on file  . Number of children: 4  . Years of education: Not on file  . Highest education level: Not on file  Occupational History  . Occupation: retired  Tobacco Use  . Smoking status: Never Smoker  . Smokeless tobacco: Never Used  Vaping Use  . Vaping Use: Never used  Substance and Sexual Activity  . Alcohol use: Yes    Comment: few drinks a year  . Drug use: No  . Sexual activity: Yes    Birth control/protection: Post-menopausal  Other Topics Concern  . Not on file  Social History Narrative  . Not on file   Social Determinants of Health   Financial Resource Strain: Low Risk   . Difficulty of Paying Living Expenses:  Not hard at all  Food Insecurity: No Food Insecurity  . Worried About Programme researcher, broadcasting/film/video in the Last Year: Never true  . Ran Out of Food in the Last Year: Never true  Transportation Needs: No Transportation Needs  . Lack of Transportation (Medical): No  . Lack of Transportation (Non-Medical): No  Physical Activity: Sufficiently Active  . Days of Exercise per Week: 3 days  . Minutes of Exercise per Session: 50 min  Stress: No Stress Concern Present  . Feeling of Stress : Not at all  Social Connections: Moderately Integrated  . Frequency of Communication with Friends and Family: More than three times a week  . Frequency of Social Gatherings with Friends and Family: More than three times a week  . Attends  Religious Services: More than 4 times per year  . Active Member of Clubs or Organizations: Yes  . Attends Banker Meetings: Never  . Marital Status: Divorced  Catering manager Violence: Not At Risk  . Fear of Current or Ex-Partner: No  . Emotionally Abused: No  . Physically Abused: No  . Sexually Abused: No   Family History  Problem Relation Age of Onset  . Lung cancer Mother   . Anxiety disorder Mother   . Alcoholism Mother   . Heart attack Father   . Sudden death Father   . Diverticulitis Paternal Grandmother   . Diverticulitis Brother        died from septic shock  . COPD Brother   . Breast cancer Daughter 28  . Other Brother        1/2 brother-  . Colon cancer Neg Hx     OBJECTIVE:  Vitals:   09/23/20 1940 09/23/20 1941  BP:  126/83  Pulse:  75  Resp:  18  Temp:  98.1 F (36.7 C)  SpO2:  96%  Weight: 162 lb (73.5 kg)   Height: 5\' 4"  (1.626 m)      General appearance: alert; appears fatigued, but nontoxic; speaking in full sentences and tolerating own secretions HEENT: NCAT; Ears: EACs clear, TMs pearly gray; Eyes: PERRL.  EOM grossly intact. Sinuses: nontender; Nose: nares patent without rhinorrhea, Throat: oropharynx clear, tonsils non erythematous or enlarged, uvula midline  Neck: supple without LAD Lungs: unlabored respirations, symmetrical air entry; cough: absent; no respiratory distress; CTAB Heart: regular rate and rhythm.  Radial pulses 2+ symmetrical bilaterally Skin: warm and dry Psychological: alert and cooperative; normal mood and affect  LABS:  Results for orders placed or performed during the hospital encounter of 09/23/20 (from the past 24 hour(s))  POCT rapid strep A     Status: None   Collection Time: 09/23/20  7:52 PM  Result Value Ref Range   Rapid Strep A Screen Negative Negative     ASSESSMENT & PLAN:  1. Body aches   2. Sore throat   3. Encounter for screening for COVID-19     No orders of the defined types were  placed in this encounter.   Discharge instructions  COVID testing ordered.  It will take between 2-7 days for test results.  Someone will contact you regarding abnormal results.    Use throat lozenges such as Halls, Cepacol or Vicks to soothe throat  Gargle with salty warm water daily use medications daily for symptom relief Use OTC medications like ibuprofen or tylenol as needed fever or pain Call or go to the ED if you have any new or worsening symptoms such as fever, worsening  cough, shortness of breath, chest tightness, chest pain, turning blue, changes in mental status, etc...   Reviewed expectations re: course of current medical issues. Questions answered. Outlined signs and symptoms indicating need for more acute intervention. Patient verbalized understanding. After Visit Summary given.         Durward Parcel, FNP 09/23/20 2017

## 2020-09-25 LAB — SARS-COV-2, NAA 2 DAY TAT

## 2020-09-25 LAB — NOVEL CORONAVIRUS, NAA: SARS-CoV-2, NAA: NOT DETECTED

## 2020-09-27 LAB — CULTURE, GROUP A STREP (THRC)

## 2020-09-28 ENCOUNTER — Ambulatory Visit (INDEPENDENT_AMBULATORY_CARE_PROVIDER_SITE_OTHER): Payer: 59 | Admitting: Adult Health

## 2020-09-28 ENCOUNTER — Encounter: Payer: Self-pay | Admitting: Adult Health

## 2020-09-28 ENCOUNTER — Other Ambulatory Visit: Payer: Self-pay

## 2020-09-28 VITALS — BP 126/88 | HR 73 | Ht 64.0 in | Wt 170.0 lb

## 2020-09-28 DIAGNOSIS — N83201 Unspecified ovarian cyst, right side: Secondary | ICD-10-CM

## 2020-09-28 DIAGNOSIS — R102 Pelvic and perineal pain: Secondary | ICD-10-CM | POA: Diagnosis not present

## 2020-09-28 NOTE — Progress Notes (Signed)
  Subjective:     Patient ID: Victoria Page, female   DOB: May 22, 1956, 65 y.o.   MRN: [id number]  HPI Victoria Page is a 65 year old white female, divorced, PM back in follow up on pelvic pain, had [place] PCP is [facility].  Review of Systems Has pain/discomfort esp LLQ and into back, feels like UTI Tried uribel without relief so stopped Reviewed past medical,surgical, social and family history. Reviewed medications and allergies.     Objective:   Physical Exam BP 126/88 (BP Location: Left Arm, Patient Position: Sitting, Cuff Size: Normal)   Pulse 73   Ht 5\' 4"  (1.626 m)   Wt 170 lb (77.1 kg)   BMI 29.18 kg/m  Skin warm and dry. Lungs: clear to ausculation bilaterally. Cardiovascular: regular rate and rhythm. Reviewed with her,still has 2 cm right ovarian cyst, unchanged since 08/19/18. Tiny uterus and thin EEC. I think may be GI related to see GI in February.   Fall risk is low  Upstream - 09/28/20 0915      Pregnancy Intention Screening   Does the patient want to become pregnant in the next year? N/A    Does the patient's partner want to become pregnant in the next year? N/A    Would the patient like to discuss contraceptive options today? N/A      Contraception Wrap Up   Current Method No Method - Other Reason   postmenopausal   End Method No Method - Other Reason   postmenopausal   Contraception Counseling Provided No          Assessment:     1. Pelvic pain Mostly on the left and in back at times, feels like UTI at times Has appt with GI 10/26/20  2. Cyst of ovary, right Stable and appears benign    Plan:     Follow up prn but keep me posted.

## 2020-10-05 ENCOUNTER — Ambulatory Visit (INDEPENDENT_AMBULATORY_CARE_PROVIDER_SITE_OTHER): Payer: 59 | Admitting: Bariatrics

## 2020-10-06 ENCOUNTER — Other Ambulatory Visit: Payer: Self-pay

## 2020-10-06 ENCOUNTER — Telehealth (INDEPENDENT_AMBULATORY_CARE_PROVIDER_SITE_OTHER): Payer: 59 | Admitting: Adult Health

## 2020-10-06 ENCOUNTER — Encounter (INDEPENDENT_AMBULATORY_CARE_PROVIDER_SITE_OTHER): Payer: Self-pay | Admitting: Adult Health

## 2020-10-06 DIAGNOSIS — J029 Acute pharyngitis, unspecified: Secondary | ICD-10-CM

## 2020-10-06 DIAGNOSIS — E8881 Metabolic syndrome: Secondary | ICD-10-CM

## 2020-10-06 DIAGNOSIS — Z6830 Body mass index (BMI) 30.0-30.9, adult: Secondary | ICD-10-CM

## 2020-10-06 DIAGNOSIS — E669 Obesity, unspecified: Secondary | ICD-10-CM

## 2020-10-06 NOTE — Progress Notes (Signed)
TeleHealth Visit:  Due to the COVID-19 pandemic, this visit was completed with telemedicine (audio/video) technology to reduce patient and provider exposure as well as to preserve personal protective equipment.   Victoria Page has verbally consented to this TeleHealth visit. The patient is located at home, the provider is located at the [facility] office. The participants in this visit include the listed provider and patient. The visit was conducted today via video.   Chief Complaint: OBESITY Victoria Page is here to discuss her progress with her obesity treatment plan along with follow-up of her obesity related diagnoses. Victoria Page is on the Category 2 Plan and states she is following her eating plan approximately 10% of the time. Victoria Page states she is doing 0 minutes 0 times per week.  Today's visit was #: 14 Starting weight: 175 lbs Starting date: 02/18/2020  Interim History: Pt is currently experiencing pharyngitis, nasal drainage, headache, and GI upset (diarrhea). Symptoms started a couple of weeks ago. She was exposed to COVID-19, however, she has had 4 negative tests, 3 PCR and 1 home antigen test. She is on day 2 of 5 day course of azithromycin. She feels that severity of symptoms are lessening with rest and antibiotic.  Interval goal:  1. Increase water intake. 2. Follow category 2 >= 75% of the time; increase activity as tolerated.  Subjective:   1. Pharyngitis, unspecified etiology Pt had 4 negative COVID tests in the last month- 3 PCR and 1 home antigen test. She was started on a course of azithromycin. She is on day 2 of a 5 day course.  Pt reports reduction of symptoms with antibiotic and rest.  2. Insulin resistance Insulin level was 11.4 on 02/18/2020 with a normal A1c of 5.3. Pt is not on Metformin, as she is utilizing lifestyle modifications.  Lab Results  Component Value Date   INSULIN 9.9 07/05/2020   INSULIN 11.4 02/18/2020   Lab Results  Component  Value Date   HGBA1C 5.3 02/18/2020    Assessment/Plan:   1. Pharyngitis, unspecified etiology Complete azithromycin and increase water intake.  2. Insulin resistance Victoria Page will continue to work on weight loss, exercise, and decreasing simple carbohydrates to help decrease the risk of diabetes. Victoria Page agreed to follow-up with [place] as directed to closely monitor her progress. Increase consistency of category 2 meal plan. Increase activity as tolerated.  3. Class 1 obesity with serious comorbidity and body mass index (BMI) of 30.0 to 30.9 in adult, unspecified obesity type Victoria Page is currently in the action stage of change. As such, her goal is to continue with weight loss efforts. She has agreed to the Category 2 Plan.   Exercise goals: Once feeling better, increase activity as tolerated.  Behavioral modification strategies: increasing lean protein intake, meal planning and cooking strategies and planning for success.  Victoria Page has agreed to follow-up with our clinic in 2-3 weeks. She was informed of the importance of frequent follow-up visits to maximize her success with intensive lifestyle modifications for her multiple health conditions.  Objective:   VITALS: Per patient if applicable, see vitals. GENERAL: Alert and in no acute distress. CARDIOPULMONARY: No increased WOB. Speaking in clear sentences.  PSYCH: Pleasant and cooperative. Speech normal rate and rhythm. Affect is appropriate. Insight and judgement are appropriate. Attention is focused, linear, and appropriate.  NEURO: Oriented as arrived to appointment on time with no prompting.   Lab Results  Component Value Date   CREATININE 0.7 05/27/2020   BUN 14 05/27/2020  NA 140 05/27/2020   K 4.7 05/27/2020   CL 101 05/27/2020   CO2 28 (A) 05/27/2020   Lab Results  Component Value Date   ALT 12 02/18/2020   AST 14 02/18/2020   ALKPHOS 103 02/18/2020   BILITOT 0.6 02/18/2020   Lab Results  Component Value Date    HGBA1C 5.3 02/18/2020   Lab Results  Component Value Date   INSULIN 9.9 07/05/2020   INSULIN 11.4 02/18/2020   Lab Results  Component Value Date   TSH 1.830 02/18/2020   Lab Results  Component Value Date   CHOL 244 (A) 05/27/2020   HDL 61 05/27/2020   LDLCALC 160 05/27/2020   TRIG 127 05/27/2020   CHOLHDL 4.5 07/29/2019   Lab Results  Component Value Date   WBC 5.5 09/18/2019   HGB 15.4 09/18/2019   HCT 45.6 (H) 09/18/2019   MCV 89.8 09/18/2019   PLT 242 09/18/2019   No results found for: IRON, TIBC, FERRITIN  Attestation Statements:   Reviewed by clinician on day of visit: allergies, medications, problem list, medical history, surgical history, family history, social history, and previous encounter notes.  Time spent on visit including pre-visit chart review and post-visit charting and care was 28 minutes.   Edmund Hilda, am acting as Energy manager for William Hamburger, NP.  I have reviewed the above documentation for accuracy and completeness, and I agree with the above. - Dawson Albers d. Rekisha Welling, NP-C

## 2020-10-20 ENCOUNTER — Other Ambulatory Visit: Payer: Self-pay

## 2020-10-20 ENCOUNTER — Ambulatory Visit (INDEPENDENT_AMBULATORY_CARE_PROVIDER_SITE_OTHER): Payer: 59 | Admitting: Adult Health

## 2020-10-20 ENCOUNTER — Encounter (INDEPENDENT_AMBULATORY_CARE_PROVIDER_SITE_OTHER): Payer: Self-pay | Admitting: Adult Health

## 2020-10-20 VITALS — BP 131/81 | HR 67 | Temp 97.9°F | Ht 64.0 in | Wt 162.0 lb

## 2020-10-20 DIAGNOSIS — E6609 Other obesity due to excess calories: Secondary | ICD-10-CM | POA: Diagnosis not present

## 2020-10-20 DIAGNOSIS — Z6830 Body mass index (BMI) 30.0-30.9, adult: Secondary | ICD-10-CM

## 2020-10-20 DIAGNOSIS — K219 Gastro-esophageal reflux disease without esophagitis: Secondary | ICD-10-CM | POA: Diagnosis not present

## 2020-10-20 DIAGNOSIS — E663 Overweight: Secondary | ICD-10-CM

## 2020-10-20 DIAGNOSIS — K581 Irritable bowel syndrome with constipation: Secondary | ICD-10-CM

## 2020-10-21 NOTE — Progress Notes (Signed)
Chief Complaint:   OBESITY Victoria Page is here to discuss her progress with her obesity treatment plan along with follow-up of her obesity related diagnoses. Victoria Page is on the Category 2 Plan and states she is following her eating plan approximately 75% of the time. Victoria Page states she is doing 0 minutes 0 times per week.  Today's visit was #: 15 Starting weight: 175 lbs Starting date: 02/18/2020 Today's weight: 162 lbs Today's date: 10/20/2020 Total lbs lost to date: 13 lbs Total lbs lost since last in-office visit: 2 lbs  Interim History: Victoria Page reports worsening GI symptoms with reflux, despite limiting trigger foods. She has upcoming appt with [facility] to address worsening GI sx's- acid reflux and food intolerances.  Her ultimate goal is to lose down to 155 lbs with BMI 26.6.  Subjective:   1. Gastroesophageal reflux disease, unspecified whether esophagitis present Pt's symptoms are not well controlled. She was on Protonix 40 mg BID. She has an appointment with [facility] to address worsening symptoms. She is not on a PPI currently.  2. Irritable bowel syndrome with constipation Pt recently stopped Linzess 145 mg daily due to dysphagia. She is managing constipation with proper nutrition and water intake. She reports daily bowel movements.  Assessment/Plan:   1. Gastroesophageal reflux disease, unspecified whether esophagitis present Intensive lifestyle modifications are the first line treatment for this issue. We discussed several lifestyle modifications today and she will continue to work on diet, exercise and weight loss efforts. Orders and follow up as documented in patient record. Follow up with [facility].  Counseling . If a person has gastroesophageal reflux disease (GERD), food and stomach acid move back up into the esophagus and cause symptoms or problems such as damage to the esophagus. . Anti-reflux measures include: raising the head of the bed, avoiding tight  clothing or belts, avoiding eating late at night, not lying down shortly after mealtime, and achieving weight loss. . Avoid ASA, NSAID's, caffeine, alcohol, and tobacco.  . OTC Pepcid and/or Tums are often very helpful for as needed use.  Marland Kitchen However, for persisting chronic or daily symptoms, stronger medications like Omeprazole may be needed. . You may need to avoid foods and drinks such as: ? Coffee and tea (with or without caffeine). ? Drinks that contain alcohol. ? Energy drinks and sports drinks. ? Bubbly (carbonated) drinks or sodas. ? Chocolate and cocoa. ? Peppermint and mint flavorings. ? Garlic and onions. ? Horseradish. ? Spicy and acidic foods. These include peppers, chili powder, curry powder, vinegar, hot sauces, and BBQ sauce. ? Citrus fruit juices and citrus fruits, such as oranges, lemons, and limes. ? Tomato-based foods. These include red sauce, chili, salsa, and pizza with red sauce. ? Fried and fatty foods. These include donuts, french fries, potato chips, and high-fat dressings. ? High-fat meats. These include hot dogs, rib eye steak, sausage, ham, and bacon.  2. Irritable bowel syndrome with constipation Follow up with  GI.  3. Class 1 obesity due to excess calories with serious comorbidity and body mass index (BMI) of 30.0 to 30.9 in adult Victoria Page is currently in the action stage of change. As such, her goal is to continue with weight loss efforts. She has agreed to the Category 2 Plan.   Exercise goals: No exercise has been prescribed at this time.  Behavioral modification strategies: increasing lean protein intake, meal planning and cooking strategies and planning for success.  Geanie has agreed to follow-up with our clinic in 2-3  weeks. She was informed of the importance of frequent follow-up visits to maximize her success with intensive lifestyle modifications for her multiple health conditions.   Objective:   Blood pressure 131/81, pulse 67,  temperature 97.9 F (36.6 C), height 5\' 4"  (1.626 m), weight 162 lb (73.5 kg), SpO2 97 %. Body mass index is 27.81 kg/m.  General: Cooperative, alert, well developed, in no acute distress. HEENT: Conjunctivae and lids unremarkable. Cardiovascular: Regular rhythm.  Lungs: Normal work of breathing. Neurologic: No focal deficits.   Lab Results  Component Value Date   CREATININE 0.7 05/27/2020   BUN 14 05/27/2020   NA 140 05/27/2020   K 4.7 05/27/2020   CL 101 05/27/2020   CO2 28 (A) 05/27/2020   Lab Results  Component Value Date   ALT 12 02/18/2020   AST 14 02/18/2020   ALKPHOS 103 02/18/2020   BILITOT 0.6 02/18/2020   Lab Results  Component Value Date   HGBA1C 5.3 02/18/2020   Lab Results  Component Value Date   INSULIN 9.9 07/05/2020   INSULIN 11.4 02/18/2020   Lab Results  Component Value Date   TSH 1.830 02/18/2020   Lab Results  Component Value Date   CHOL 244 (A) 05/27/2020   HDL 61 05/27/2020   LDLCALC 160 05/27/2020   TRIG 127 05/27/2020   CHOLHDL 4.5 07/29/2019   Lab Results  Component Value Date   WBC 5.5 09/18/2019   HGB 15.4 09/18/2019   HCT 45.6 (H) 09/18/2019   MCV 89.8 09/18/2019   PLT 242 09/18/2019     Attestation Statements:   Reviewed by clinician on day of visit: allergies, medications, problem list, medical history, surgical history, family history, social history, and previous encounter notes.  Time spent on visit including pre-visit chart review and post-visit care and charting was 32 minutes.   09/20/2019, am acting as Edmund Hilda for Energy manager, NP.  I have reviewed the above documentation for accuracy and completeness, and I agree with the above. -  Rosebud Koenen d. Pearla Mckinny, NP-C

## 2020-10-24 DIAGNOSIS — K581 Irritable bowel syndrome with constipation: Secondary | ICD-10-CM | POA: Insufficient documentation

## 2020-10-24 DIAGNOSIS — K588 Other irritable bowel syndrome: Secondary | ICD-10-CM | POA: Insufficient documentation

## 2020-10-26 ENCOUNTER — Ambulatory Visit (INDEPENDENT_AMBULATORY_CARE_PROVIDER_SITE_OTHER): Payer: 59 | Admitting: Gastroenterology

## 2020-10-26 ENCOUNTER — Encounter: Payer: Self-pay | Admitting: Gastroenterology

## 2020-10-26 ENCOUNTER — Other Ambulatory Visit: Payer: Self-pay

## 2020-10-26 VITALS — BP 140/80 | HR 77 | Ht 64.75 in | Wt 167.0 lb

## 2020-10-26 DIAGNOSIS — R110 Nausea: Secondary | ICD-10-CM | POA: Diagnosis not present

## 2020-10-26 DIAGNOSIS — K588 Other irritable bowel syndrome: Secondary | ICD-10-CM | POA: Diagnosis not present

## 2020-10-26 DIAGNOSIS — R1030 Lower abdominal pain, unspecified: Secondary | ICD-10-CM | POA: Diagnosis not present

## 2020-10-26 MED ORDER — ONDANSETRON 4 MG PO TBDP: 4.0000 mg | ORAL_TABLET | Freq: Three times a day (TID) | ORAL | 1 refills | Status: DC | PRN

## 2020-10-26 MED ORDER — IBGARD 90 MG PO CPCR: ORAL_CAPSULE | ORAL | 0 refills | Status: DC

## 2020-10-26 MED ORDER — DESIPRAMINE HCL 25 MG PO TABS: 25.0000 mg | ORAL_TABLET | Freq: Every day | ORAL | 1 refills | Status: DC

## 2020-10-26 NOTE — Patient Instructions (Addendum)
If you are age 64 or older, your body mass index should be between 23-30. Your Body mass index is 28.01 kg/m. If this is out of the aforementioned range listed, please consider follow up with your Primary Care Provider.  If you are age 57 or younger, your body mass index should be between 19-25. Your Body mass index is 28.01 kg/m. If this is out of the aformentioned range listed, please consider follow up with your Primary Care Provider.   We have sent the following medications to your pharmacy for you to pick up at your convenience: Desipramine 25 mg: Take daily at bedtime  Zofran 4 mg ODT (orally dissolving tablet): Take every 6 hours as needed  We have given you samples of the following medication to take: IBGard - Take as directed  You have a follow up appointment scheduled for Tuesday, 12-28-20 at 10:10 am.  Thank you for entrusting me with your care and for choosing [facility], Dr. Ileene Patrick

## 2020-10-26 NOTE — Progress Notes (Signed)
HPI :  65 year old female here for follow-up visit for abdominal pain and altered bowel habits.  She has reported history of IBS, GERD, gallstones.  I initially saw her in November we discussed GERD, belching, abdominal pain, constipation.  She has had work-up historically as outlined below.  We reviewed her symptoms that are bothering her today.  Her main complaint is abdominal pain.  This is been ongoing for years.  Severity and frequency fluctuates.  At the last visit she reported her pain location was mostly left upper quadrant, today her abdominal pain is mostly located in her left abdomen and lower abdomen more diffusely.  It tends to come and go but has been present more frequently than has in the past.  Severity can certainly fluctuate.  Pain can be worse with the urge to have a bowel movement and also bothers her more in the mornings in the evenings.  She does find some short-lived relief after bowel movement.  Nothing else really makes this better or worse other than a heating pad she can apply to her abdomen at times.  She also has some "attacks" which she describes as urgent loose stools that are difficult to control at times.  Historically she has had problems with constipation, has been using Linzess, which cause diarrhea, also has tried Amitiza and MiraLAX.  She states her bowel habits have changed and she does not have any problems with constipation anymore.  She is not taking any laxatives at this time.  She is going at least 1 bowel movement per day, occasionally 2 or 3 and stools are soft.  Historically I see in her chart she is tried Levsin and Bentyl in the past, she cannot recall if it helped or not, per my last note I documented at that time she did not find it helps her too much.  She states she has been to see her gynecologist who did not find any cause for her pain.  She has some nausea at times but does not have any vomiting.  She is able to tolerate p.o. and eat okay.  She does  have belching that bothers her occasionally, Protonix did not help with that.  She had an EGD with me in November which did not show anything too concerning.  She had some duodenal diverticulosis.  Biopsies for H. pylori negative.  No Barrett's esophagus.  She does have a history of gallstones on an ultrasound in 2020 and a follow-up HIDA scan which was negative.  She has seen a general surgeon about this in the past and told that surgery was unlikely to relieve her symptoms..  She denies any right upper quadrant or epigastric pain.  Does not have routine postprandial discomfort.  She had a colonoscopy in 2018 which was reportedly normal other than hemorrhoids.  No polyps.  She denies any family history of colon cancer or gastric cancer.  Prior work-up Colonoscopy 09/2016 - normal exam, external hemorrhoids -   [place] 01/15/19 - gallstones  HIDA - 01/24/19 - normal  CT abdomen / pelvis 08/18/17 - no acute changes, fatty atrophy of pancreatic head  Stool occult blood 05/17/20 - negative Celiac panel negative 09/23/19    EGD 07/19/20 -  - The exam of the esophagus was otherwise normal. No obvious erosive changes. - Biopsies were taken with a cold forceps in the upper third of the esophagus, in the middle third of the esophagus and in the lower third of the esophagus for histology to rule  out eosinophilic esophagitis. - The entire examined stomach was normal. Biopsies were taken with a cold forceps for Helicobacter pylori testing. - Two medium to large diverticulum were found in the second portion of the duodenum. - The exam of the duodenum was otherwise normal.  1. Surgical [P], gastric antrum and body - ANTRAL AND OXYNTIC MUCOSA WITH HYPEREMIA. - WARTHIN-STARRY NEGATIVE FOR HELICOBACTER PYLORI. - NO INTESTINAL METAPLASIA, DYSPLASIA, OR CARCINOMA. 2. Surgical [P], random esophagus - UNREMARKABLE SQUAMOUS MUCOSA. - NO EOSINOPHILIC ESOPHAGITIS (LESS THAN 2 PER HIGH POWER FIELD).   Past  Medical History:  Diagnosis Date  . Anxiety   . Back pain   . Biliary colic   . Constipation   . Gallstones   . GERD (gastroesophageal reflux disease)   . High cholesterol   . High triglycerides   . History of sinus problem   . Hypertension   . IBS (irritable bowel syndrome)   . Joint pain   . LLQ pain 08/19/2014  . Plantar fasciitis   . SOB (shortness of breath)      Past Surgical History:  Procedure Laterality Date  . COLONOSCOPY N/A 09/06/2016   Procedure: COLONOSCOPY;  Surgeon: Malissa Hippo, MD;  Location: [facility];  Service: Endoscopy;  Laterality: N/A;  8:30  . FLEXIBLE SIGMOIDOSCOPY     Family History  Problem Relation Age of Onset  . Lung cancer Mother   . Anxiety disorder Mother   . Alcoholism Mother   . Heart attack Father   . Sudden death Father   . Diverticulitis Paternal Grandmother   . Diverticulitis Brother        died from septic shock  . COPD Brother   . Breast cancer Daughter 23  . Other Brother        1/2 brother-  . Colon cancer Neg Hx    Social History   Tobacco Use  . Smoking status: Never Smoker  . Smokeless tobacco: Never Used  Vaping Use  . Vaping Use: Never used  Substance Use Topics  . Alcohol use: Yes    Comment: few drinks a year  . Drug use: No   Current Outpatient Medications  Medication Sig Dispense Refill  . ALPRAZolam (XANAX) 0.5 MG tablet Take 0.5 mg by mouth daily as needed.    Marland Kitchen CALCIUM PO Take by mouth. 1 chewable daily    . co-enzyme Q-10 30 MG capsule Take 30 mg by mouth 3 (three) times daily.    . hydrochlorothiazide (HYDRODIURIL) 25 MG tablet Take 25 mg by mouth daily.     No current facility-administered medications for this visit.   Allergies  Allergen Reactions  . Shrimp [Shellfish Allergy] Nausea And Vomiting  . Sulfa Antibiotics      Review of Systems: All systems reviewed and negative except where noted in HPI.   Lab Results  Component Value Date   WBC 5.5 09/18/2019   HGB 15.4 09/18/2019    HCT 45.6 (H) 09/18/2019   MCV 89.8 09/18/2019   PLT 242 09/18/2019    Lab Results  Component Value Date   CREATININE 0.7 05/27/2020   BUN 14 05/27/2020   NA 140 05/27/2020   K 4.7 05/27/2020   CL 101 05/27/2020   CO2 28 (A) 05/27/2020    Lab Results  Component Value Date   ALT 12 02/18/2020   AST 14 02/18/2020   ALKPHOS 103 02/18/2020   BILITOT 0.6 02/18/2020     Physical Exam: BP 140/80   Pulse  77   Ht 5' 4.75" (1.645 m)   Wt 167 lb (75.8 kg)   BMI 28.01 kg/m  Constitutional: Pleasant,well-developed, female in no acute distress. Abdominal: Soft, nondistended, nontender. There are no masses palpable. Extremities: no edema Neurological: Alert and oriented to person place and time. Psychiatric: Normal mood and affect. Behavior is normal.   ASSESSMENT AND PLAN: 65 year old female here for reassessment of the following:  Suspected IBS  Lower abdominal pain Nausea  History as outlined above.  Longstanding symptoms ongoing for years with negative CT imaging of the abdomen, colonoscopy, EGD.  She does have gallstones but her symptoms are atypical for biliary colic and agree that gallstones would be less likely to cause her symptoms.  Overall given chronicity of her symptoms and negative work-up with imaging and endoscopy so far I suspect she more than likely has irritable bowel syndrome.  We discussed what this is.  I discussed options with her.  Given her predominant pain to this complex of symptoms I recommend trialing her on a TCA.  I discussed what this is with her, risks and potential side effects.  She is very weary of trying Elavil in regards to potential constipating effects, discussed trying another drug within TCA class which would be less constipating.  She was agreeable to try desipramine 25 mg nightly over the next few weeks.  I counseled her this can take a few weeks to really kick in, may titrate up the dose over time if she tolerates it.  I would also like to  try her on IBgard as needed to see if that helps.  For her periodic nausea she can try Zofran ODT as needed as well.  I will see her back in 2 months for reassessment and adjustment of her regimen as needed.  All questions answered she agreed with the plan, further recommendations pending her course moving forward.  Ileene Patrick, MD [facility]

## 2020-11-04 ENCOUNTER — Other Ambulatory Visit: Payer: Self-pay

## 2020-11-04 ENCOUNTER — Ambulatory Visit (INDEPENDENT_AMBULATORY_CARE_PROVIDER_SITE_OTHER): Payer: 59 | Admitting: Adult Health

## 2020-11-04 ENCOUNTER — Encounter (INDEPENDENT_AMBULATORY_CARE_PROVIDER_SITE_OTHER): Payer: Self-pay | Admitting: Adult Health

## 2020-11-04 VITALS — BP 121/83 | HR 70 | Temp 97.6°F | Ht 64.0 in | Wt 162.0 lb

## 2020-11-04 DIAGNOSIS — K581 Irritable bowel syndrome with constipation: Secondary | ICD-10-CM | POA: Diagnosis not present

## 2020-11-04 DIAGNOSIS — E663 Overweight: Secondary | ICD-10-CM

## 2020-11-04 DIAGNOSIS — Z6827 Body mass index (BMI) 27.0-27.9, adult: Secondary | ICD-10-CM | POA: Insufficient documentation

## 2020-11-08 NOTE — Progress Notes (Addendum)
Chief Complaint:   OBESITY Victoria Page is here to discuss her progress with her obesity treatment plan along with follow-up of her obesity related diagnoses. Victoria Page is on the Category 2 Plan and states she is following her eating plan approximately 75% of the time. Victoria Page states she is doing 0 minutes 0 times per week.  Today's visit was #: 16 Starting weight: 175 lbs Starting date: 02/18/2020 Today's weight: 160 lbs Today's date: 11/04/2020 Total lbs lost to date: 15 lbs Total lbs lost since last in-office visit: 2 lbs  Interim History: Victoria Page was seen by GI on 10/26/2020. She was started on IBgard and desipramine to treat abdominal pain with alternating constipation and diarrhea. She has taken 2 doses of desipramine and experienced arm pain so she stopped the prescription. She will experience abdominal pain with certain high fat/greasy foods like french fries and pizza.  Subjective:   1. Irritable bowel syndrome with constipation  Victoria Page was seen by GI, Dr. Adela Lank, on 10/26/2020. She was started on desipramine 25 mg nightly. She has taken 2 doses and reports it cause upper arm pain.  Assessment/Plan:   1. Irritable bowel syndrome with constipation Avoid high saturated fat/fried foods. Follow up with Dr. Pasty Arch.  2. Oveweight BMI 27.6 Victoria Page is currently in the action stage of change. As such, her goal is to continue with weight loss efforts. She has agreed to the Category 2 Plan.   Avoid foods that will trigger abdominal discomfort, GI upset. Avoid high fatty/fried foods.  Exercise goals: No exercise has been prescribed at this time.  Behavioral modification strategies: increasing lean protein intake, decreasing simple carbohydrates, increasing water intake and planning for success.  Victoria Page has agreed to follow-up with our clinic in 2-3 weeks. She was informed of the importance of frequent follow-up visits to maximize her success with intensive lifestyle  modifications for her multiple health conditions.   Objective:   Blood pressure 121/83, pulse 70, temperature 97.6 F (36.4 C), height 5\' 4"  (1.626 m), weight 162 lb (73.5 kg), SpO2 99 %. Body mass index is 27.81 kg/m.  General: Cooperative, alert, well developed, in no acute distress. HEENT: Conjunctivae and lids unremarkable. Cardiovascular: Regular rhythm.  Lungs: Normal work of breathing. Neurologic: No focal deficits.   Lab Results  Component Value Date   CREATININE 0.7 05/27/2020   BUN 14 05/27/2020   NA 140 05/27/2020   K 4.7 05/27/2020   CL 101 05/27/2020   CO2 28 (A) 05/27/2020   Lab Results  Component Value Date   ALT 12 02/18/2020   AST 14 02/18/2020   ALKPHOS 103 02/18/2020   BILITOT 0.6 02/18/2020   Lab Results  Component Value Date   HGBA1C 5.3 02/18/2020   Lab Results  Component Value Date   INSULIN 9.9 07/05/2020   INSULIN 11.4 02/18/2020   Lab Results  Component Value Date   TSH 1.830 02/18/2020   Lab Results  Component Value Date   CHOL 244 (A) 05/27/2020   HDL 61 05/27/2020   LDLCALC 160 05/27/2020   TRIG 127 05/27/2020   CHOLHDL 4.5 07/29/2019   Lab Results  Component Value Date   WBC 5.5 09/18/2019   HGB 15.4 09/18/2019   HCT 45.6 (H) 09/18/2019   MCV 89.8 09/18/2019   PLT 242 09/18/2019    Attestation Statements:   Reviewed by clinician on day of visit: allergies, medications, problem list, medical history, surgical history, family history, social history, and previous encounter notes.  Time spent on  visit including pre-visit chart review and post-visit care and charting was 28 minutes.   Edmund Hilda, am acting as Energy manager for William Hamburger, NP.  I have reviewed the above documentation for accuracy and completeness, and I agree with the above. -  Rumaisa Schnetzer d. Srinika Delone, NP-C

## 2020-11-15 ENCOUNTER — Encounter (INDEPENDENT_AMBULATORY_CARE_PROVIDER_SITE_OTHER): Payer: Self-pay | Admitting: Adult Health

## 2020-11-23 ENCOUNTER — Encounter (INDEPENDENT_AMBULATORY_CARE_PROVIDER_SITE_OTHER): Payer: Self-pay | Admitting: Adult Health

## 2020-11-23 ENCOUNTER — Ambulatory Visit (INDEPENDENT_AMBULATORY_CARE_PROVIDER_SITE_OTHER): Payer: 59 | Admitting: Adult Health

## 2020-11-23 ENCOUNTER — Other Ambulatory Visit: Payer: Self-pay

## 2020-11-23 VITALS — BP 120/77 | HR 74 | Temp 98.4°F | Ht 64.0 in | Wt 162.0 lb

## 2020-11-23 DIAGNOSIS — E88819 Insulin resistance, unspecified: Secondary | ICD-10-CM

## 2020-11-23 DIAGNOSIS — Z6830 Body mass index (BMI) 30.0-30.9, adult: Secondary | ICD-10-CM

## 2020-11-23 DIAGNOSIS — Z9189 Other specified personal risk factors, not elsewhere classified: Secondary | ICD-10-CM

## 2020-11-23 DIAGNOSIS — E7849 Other hyperlipidemia: Secondary | ICD-10-CM

## 2020-11-23 DIAGNOSIS — I10 Essential (primary) hypertension: Secondary | ICD-10-CM | POA: Diagnosis not present

## 2020-11-23 DIAGNOSIS — E8881 Metabolic syndrome: Secondary | ICD-10-CM

## 2020-11-23 DIAGNOSIS — E559 Vitamin D deficiency, unspecified: Secondary | ICD-10-CM

## 2020-11-23 DIAGNOSIS — E669 Obesity, unspecified: Secondary | ICD-10-CM

## 2020-11-24 LAB — COMPREHENSIVE METABOLIC PANEL
ALT: 17 IU/L (ref 0–32)
AST: 20 IU/L (ref 0–40)
Albumin/Globulin Ratio: 1.5 (ref 1.2–2.2)
Albumin: 4.5 g/dL (ref 3.8–4.8)
Alkaline Phosphatase: 103 IU/L (ref 44–121)
BUN/Creatinine Ratio: 20 (ref 12–28)
BUN: 13 mg/dL (ref 8–27)
Bilirubin Total: 0.5 mg/dL (ref 0.0–1.2)
CO2: 25 mmol/L (ref 20–29)
Calcium: 9.8 mg/dL (ref 8.7–10.3)
Chloride: 98 mmol/L (ref 96–106)
Creatinine, Ser: 0.64 mg/dL (ref 0.57–1.00)
Globulin, Total: 3 g/dL (ref 1.5–4.5)
Glucose: 82 mg/dL (ref 65–99)
Potassium: 4.7 mmol/L (ref 3.5–5.2)
Sodium: 138 mmol/L (ref 134–144)
Total Protein: 7.5 g/dL (ref 6.0–8.5)
eGFR: 99 mL/min/{1.73_m2} (ref >59)

## 2020-11-24 LAB — LIPID PANEL
Chol/HDL Ratio: 4 ratio (ref 0.0–4.4)
Cholesterol, Total: 239 mg/dL — ABNORMAL HIGH (ref 100–199)
HDL: 60 mg/dL (ref >39)
LDL Chol Calc (NIH): 153 mg/dL — ABNORMAL HIGH (ref 0–99)
Triglycerides: 148 mg/dL (ref 0–149)
VLDL Cholesterol Cal: 26 mg/dL (ref 5–40)

## 2020-11-24 LAB — INSULIN, RANDOM: INSULIN: 4.8 u[IU]/mL (ref 2.6–24.9)

## 2020-11-24 LAB — VITAMIN D 25 HYDROXY (VIT D DEFICIENCY, FRACTURES): Vit D, 25-Hydroxy: 40.1 ng/mL (ref 30.0–100.0)

## 2020-11-24 LAB — HEMOGLOBIN A1C
Est. average glucose Bld gHb Est-mCnc: 108 mg/dL
Hgb A1c MFr Bld: 5.4 % (ref 4.8–5.6)

## 2020-11-24 NOTE — Progress Notes (Signed)
Chief Complaint:   OBESITY Victoria Page is here to discuss her progress with her obesity treatment plan along with follow-up of her obesity related diagnoses. Victoria Page is on the Category 2 Plan and states she is following her eating plan approximately 60% of the time. Victoria Page states she is walking 30-45 minutes 3 times per week.  Today's visit was #: 17 Starting weight: 175 lbs Starting date: 02/18/2020 Today's weight: 162 lbs Today's date: 11/23/2020 Total lbs lost to date: 13 lbs Total lbs lost since last in-office visit: 0  Interim History: Due to frequent GI upset (IBS), Victoria Page will avoid animal protein. She has been consuming banana's, protein oatmeal, and soups. She is interested in converting to journaling plan. Category 3 has too much food, category 2 fit hunger levels better. She will often skip meals when IBS flares occur.  Subjective:   1. Insulin resistance Victoria Page is not on Metformin and denies polyphagia.  2. Essential hypertension Victoria Page is on HCTZ 25 mg QD.  BP Readings from Last 3 Encounters:  11/23/20 120/77  11/04/20 121/83  10/26/20 140/80    3. Other hyperlipidemia Victoria Page is not on any statin therapy.  4. Vitamin D deficiency Victoria Page's Vitamin D level was 40.4 on 09/07/2020. She is currently taking no vitamin D supplement.   5. At risk for deficient intake of food Victoria Page is at risk for deficient intake of food due to IBS sensitivities and obesity.  Assessment/Plan:   1. Insulin resistance Victoria Page will continue to work on weight loss, exercise, and decreasing simple carbohydrates to help decrease the risk of diabetes. Victoria Page agreed to follow-up with [place] as directed to closely monitor her progress. Check labs today.  - Hemoglobin A1c - Insulin, random  2. Essential hypertension Victoria Page is working on healthy weight loss and exercise to improve blood pressure control. We will watch for signs of hypotension as she continues her lifestyle  modifications. Check labs today.  - Comprehensive metabolic panel  3. Other hyperlipidemia Cardiovascular risk and specific lipid/LDL goals reviewed.  We discussed several lifestyle modifications today and Victoria Page will continue to work on diet, exercise and weight loss efforts. Orders and follow up as documented in patient record. Check labs today.  Counseling Intensive lifestyle modifications are the first line treatment for this issue. . Dietary changes: Increase soluble fiber. Decrease simple carbohydrates. . Exercise changes: Moderate to vigorous-intensity aerobic activity 150 minutes per week if tolerated. . Lipid-lowering medications: see documented in medical record. - Lipid panel  4. Vitamin D deficiency Low Vitamin D level contributes to fatigue and are associated with obesity, breast, and colon cancer. She agrees to follow-up for routine testing of Vitamin D, at least 2-3 times per year to avoid over-replacement. Check labs today.  - VITAMIN D 25 Hydroxy (Vit-D Deficiency, Fractures)  5. At risk for deficient intake of food Victoria Page was given approximately 15 minutes of deficit intake of food prevention counseling today. Victoria Page is at risk for eating too few calories based on current food recall. She was encouraged to focus on meeting caloric and protein goals according to her recommended meal plan.   6. Class 1 obesity with serious comorbidity and body mass index (BMI) of 30.0 to 30.9 in adult, unspecified obesity type Victoria Page is currently in the action stage of change. As such, her goal is to continue with weight loss efforts. She has agreed to keeping a food journal and adhering to recommended goals of 1250-1350 calories and 85 g protein.   Exercise goals:  As is  Behavioral modification strategies: increasing lean protein intake, meal planning and cooking strategies, planning for success and keeping a strict food journal.  Victoria Page has agreed to follow-up with our clinic  in 2-3 weeks. She was informed of the importance of frequent follow-up visits to maximize her success with intensive lifestyle modifications for her multiple health conditions.   Objective:   Blood pressure 120/77, pulse 74, temperature 98.4 F (36.9 C), height 5\' 4"  (1.626 m), weight 162 lb (73.5 kg), SpO2 99 %. Body mass index is 27.81 kg/m.  General: Cooperative, alert, well developed, in no acute distress. HEENT: Conjunctivae and lids unremarkable. Cardiovascular: Regular rhythm.  Lungs: Normal work of breathing. Neurologic: No focal deficits.   Lab Results  Component Value Date   CREATININE 0.64 11/23/2020   BUN 13 11/23/2020   NA 138 11/23/2020   K 4.7 11/23/2020   CL 98 11/23/2020   CO2 25 11/23/2020   Lab Results  Component Value Date   ALT 17 11/23/2020   AST 20 11/23/2020   ALKPHOS 103 11/23/2020   BILITOT 0.5 11/23/2020   Lab Results  Component Value Date   HGBA1C 5.4 11/23/2020   HGBA1C 5.3 02/18/2020   Lab Results  Component Value Date   INSULIN 4.8 11/23/2020   INSULIN 9.9 07/05/2020   INSULIN 11.4 02/18/2020   Lab Results  Component Value Date   TSH 1.830 02/18/2020   Lab Results  Component Value Date   CHOL 239 (H) 11/23/2020   HDL 60 11/23/2020   LDLCALC 153 (H) 11/23/2020   TRIG 148 11/23/2020   CHOLHDL 4.0 11/23/2020   Lab Results  Component Value Date   WBC 5.5 09/18/2019   HGB 15.4 09/18/2019   HCT 45.6 (H) 09/18/2019   MCV 89.8 09/18/2019   PLT 242 09/18/2019    Attestation Statements:   Reviewed by clinician on day of visit: allergies, medications, problem list, medical history, surgical history, family history, social history, and previous encounter notes.  09/20/2019, am acting as Edmund Hilda for Energy manager, NP.  I have reviewed the above documentation for accuracy and completeness, and I agree with the above. -  Raniyah Curenton d. Bradee Common, NP-C

## 2020-12-08 ENCOUNTER — Encounter (INDEPENDENT_AMBULATORY_CARE_PROVIDER_SITE_OTHER): Payer: Self-pay | Admitting: Adult Health

## 2020-12-09 NOTE — Telephone Encounter (Signed)
Pt requesting literature on eating healthy for teaching purposes.

## 2020-12-14 ENCOUNTER — Ambulatory Visit (INDEPENDENT_AMBULATORY_CARE_PROVIDER_SITE_OTHER): Payer: 59 | Admitting: Adult Health

## 2020-12-14 ENCOUNTER — Encounter (INDEPENDENT_AMBULATORY_CARE_PROVIDER_SITE_OTHER): Payer: Self-pay | Admitting: Adult Health

## 2020-12-14 ENCOUNTER — Other Ambulatory Visit: Payer: Self-pay

## 2020-12-14 VITALS — BP 126/79 | HR 91 | Temp 98.0°F | Ht 64.0 in | Wt 162.0 lb

## 2020-12-14 DIAGNOSIS — Z6830 Body mass index (BMI) 30.0-30.9, adult: Secondary | ICD-10-CM

## 2020-12-14 DIAGNOSIS — E559 Vitamin D deficiency, unspecified: Secondary | ICD-10-CM

## 2020-12-14 DIAGNOSIS — E8881 Metabolic syndrome: Secondary | ICD-10-CM | POA: Diagnosis not present

## 2020-12-14 DIAGNOSIS — E7849 Other hyperlipidemia: Secondary | ICD-10-CM

## 2020-12-14 DIAGNOSIS — I10 Essential (primary) hypertension: Secondary | ICD-10-CM

## 2020-12-14 DIAGNOSIS — E6609 Other obesity due to excess calories: Secondary | ICD-10-CM

## 2020-12-15 NOTE — Progress Notes (Signed)
Chief Complaint:   OBESITY Victoria Page is here to discuss her progress with her obesity treatment plan along with follow-up of her obesity related diagnoses. Victoria Page is on the Category 2 Plan and keeping a food journal and adhering to recommended goals of 1250-1350 calories and 85 protein and states she is following her eating plan approximately 80% of the time. Victoria Page states she is walking 30-45 minutes 3-4 times per week.  Today's visit was #: 18 Starting weight: 175 lbs Starting date: 02/18/2020 Today's weight: 162 lbs Today's date: 12/14/2020 Total lbs lost to date: 13 Total lbs lost since last in-office visit: 0  Interim History: Victoria Page reports journaling was too loose of a plan.  She needs more eating guidance. She converted back to category 2 meal plan.  Subjective:   1. Essential hypertension Discussed labs with patient today. Victoria Page's BP/HR are excellent at OV today. Her 11/23/2020 CMP was stable. She is on HCTZ 25 mg QD.   BP Readings from Last 3 Encounters:  12/14/20 126/79  11/23/20 120/77  11/04/20 121/83   2. Insulin resistance Discussed labs with patient today. Victoria Page's 11/23/2020 A1c 5.4, BG 82, and insulin level 4.8- at goal.  Lab Results  Component Value Date   INSULIN 4.8 11/23/2020   INSULIN 9.9 07/05/2020   INSULIN 11.4 02/18/2020   Lab Results  Component Value Date   HGBA1C 5.4 11/23/2020    3. Other hyperlipidemia Victoria Page's 11/23/2020 lipid panel revealed total 239 and LDL 153. Her ASCVD risk score 6.3%. we discussed risk/benefits of statin therapy. She has a family history of CAD/HLD- father and sister. She declines statin therapy today.  Lab Results  Component Value Date   CHOL 239 (H) 11/23/2020   HDL 60 11/23/2020   LDLCALC 153 (H) 11/23/2020   TRIG 148 11/23/2020   CHOLHDL 4.0 11/23/2020   Lab Results  Component Value Date   ALT 17 11/23/2020   AST 20 11/23/2020   ALKPHOS 103 11/23/2020   BILITOT 0.5 11/23/2020   The  10-year ASCVD risk score Denman George DC Jr., et al., 2013) is: 6.3%   Values used to calculate the score:     Age: 65 years     Sex: Female     Is Non-Hispanic African American: No     Diabetic: No     Tobacco smoker: No     Systolic Blood Pressure: 126 mmHg     Is BP treated: Yes     HDL Cholesterol: 60 mg/dL     Total Cholesterol: 239 mg/dL   4. Vitamin D deficiency Victoria Page's Vitamin D level was 40.1 on 11/23/2020, which is below goal of 50. She is currently taking OTC vitamin D 2,000 IU each day. She denies nausea, vomiting or muscle weakness.  Assessment/Plan:   1. Essential hypertension Victoria Page is working on healthy weight loss and exercise to improve blood pressure control. We will watch for signs of hypotension as she continues her lifestyle modifications. Continue HCTZ 25 mg QD.  2. Insulin resistance Victoria Page will continue to work on weight loss, exercise, and decreasing simple carbohydrates to help decrease the risk of diabetes. Victoria Page agreed to follow-up with Victoria Page as directed to closely monitor her progress. Continue to increase protein and regular exercise.  3. Other hyperlipidemia Cardiovascular risk and specific lipid/LDL goals reviewed.  We discussed several lifestyle modifications today and Victoria Page will continue to work on diet, exercise and weight loss efforts. Orders and follow up as documented in patient record. Decrease saturated fat.  Recheck every 3 months.  Restart OTC supplement.  Counseling Intensive lifestyle modifications are the first line treatment for this issue. . Dietary changes: Increase soluble fiber. Decrease simple carbohydrates. . Exercise changes: Moderate to vigorous-intensity aerobic activity 150 minutes per week if tolerated. . Lipid-lowering medications: see documented in medical record.  4. Vitamin D deficiency Low Vitamin D level contributes to fatigue and are associated with obesity, breast, and colon cancer. She agrees to increase  supplement to OTC Vitamin D3 @5 ,000 IU daily and will follow-up for routine testing of Vitamin D, at least 2-3 times per year to avoid over-replacement.  5. Obesity with current BMI 27.9 is currently in the action stage of change. As such, her goal is to continue with weight loss efforts. She has agreed to the Category 2 Plan.   Exercise goals: As is  Behavioral modification strategies: increasing lean protein intake, meal planning and cooking strategies, keeping healthy foods in the home and planning for success.  Victoria Page has agreed to follow-up with our clinic in 3 weeks. She was informed of the importance of frequent follow-up visits to maximize her success with intensive lifestyle modifications for her multiple health conditions.   Objective:   Blood pressure 126/79, pulse 91, temperature 98 F (36.7 C), height 5\' 4"  (1.626 m), weight 162 lb (73.5 kg), SpO2 100 %. Body mass index is 27.81 kg/m.  General: Cooperative, alert, well developed, in no acute distress. HEENT: Conjunctivae and lids unremarkable. Cardiovascular: Regular rhythm.  Lungs: Normal work of breathing. Neurologic: No focal deficits.   Lab Results  Component Value Date   CREATININE 0.64 11/23/2020   BUN 13 11/23/2020   NA 138 11/23/2020   K 4.7 11/23/2020   CL 98 11/23/2020   CO2 25 11/23/2020   Lab Results  Component Value Date   ALT 17 11/23/2020   AST 20 11/23/2020   ALKPHOS 103 11/23/2020   BILITOT 0.5 11/23/2020   Lab Results  Component Value Date   HGBA1C 5.4 11/23/2020   HGBA1C 5.3 02/18/2020   Lab Results  Component Value Date   INSULIN 4.8 11/23/2020   INSULIN 9.9 07/05/2020   INSULIN 11.4 02/18/2020   Lab Results  Component Value Date   TSH 1.830 02/18/2020   Lab Results  Component Value Date   CHOL 239 (H) 11/23/2020   HDL 60 11/23/2020   LDLCALC 153 (H) 11/23/2020   TRIG 148 11/23/2020   CHOLHDL 4.0 11/23/2020   Lab Results  Component Value Date   WBC 5.5  09/18/2019   HGB 15.4 09/18/2019   HCT 45.6 (H) 09/18/2019   MCV 89.8 09/18/2019   PLT 242 09/18/2019    Attestation Statements:   Reviewed by clinician on day of visit: allergies, medications, problem list, medical history, surgical history, family history, social history, and previous encounter notes.  Time spent on visit including pre-visit chart review and post-visit care and charting was 34 minutes.   09/20/2019, am acting as 09/20/2019 for Edmund Hilda, NP.  I have reviewed the above documentation for accuracy and completeness, and I agree with the above. -  Victoria Cong d. Braeleigh Pyper, NP-C

## 2020-12-28 ENCOUNTER — Ambulatory Visit (INDEPENDENT_AMBULATORY_CARE_PROVIDER_SITE_OTHER): Payer: 59 | Admitting: Gastroenterology

## 2020-12-28 ENCOUNTER — Encounter: Payer: Self-pay | Admitting: Gastroenterology

## 2020-12-28 VITALS — BP 116/74 | HR 69 | Ht 65.0 in | Wt 166.0 lb

## 2020-12-28 DIAGNOSIS — R1030 Lower abdominal pain, unspecified: Secondary | ICD-10-CM | POA: Diagnosis not present

## 2020-12-28 DIAGNOSIS — K589 Irritable bowel syndrome without diarrhea: Secondary | ICD-10-CM

## 2020-12-28 MED ORDER — LINACLOTIDE 72 MCG PO CAPS: 72.0000 ug | ORAL_CAPSULE | Freq: Every day | ORAL | 0 refills | Status: DC

## 2020-12-28 MED ORDER — POLYETHYLENE GLYCOL 3350 17 G PO PACK: PACK | ORAL | 0 refills | Status: DC

## 2020-12-28 NOTE — Progress Notes (Signed)
HPI :  65 year old female here for follow-up visit, she has a history of suspected IBS abdominal pains.  Please see last office note in February of this year for full details of her history.  Recall that she has a years worth of intermittent abdominal discomfort and altered bowels.  She has had an extensive work-up that has been negative to date including colonoscopy, endoscopy, imaging.  She does have gallstones but symptoms have been atypical for biliary colic, she has seen general surgery who has told her surgery is unlikely to relieve her symptoms.  At the last visit I recommended a trial of a TCA, we discussed desipramine.  I also recommend a trial of IBgard for cramps.  She states she took the IBgard and felt like it caused more constipation and did not like how that made her feel so she stopped it.  She took the desipramine and states she felt body aches all over when she took it so she stopped it.  When she stopped it the symptoms went away, she try resuming it again to see how she tolerated it and again symptoms recurred so she stopped that again.  Generally she states she is been doing a bit better since have last seen her.  She does continue to get constipated at times, can have upwards of a few days without a bowel movement and then will have an attack of pain.  Using MiraLAX dosed at once daily in the past has not helped.  She was on Linzess 145 mcg in the past and that was too aggressive for her.  She has also failed Amitiza in the past.  Her bowel frequency is the main issue at this time, the cramping does not appear as bad as the last time.  She otherwise has been using cinnamon and lemon grass for treatment of her hyperlipidemia and states that has made her reflux symptoms better.  She states she has not benefited from Prilosec to Protonix in the past but reflux in general appears to be better now.  She had an endoscopy in recent year which did not show anything too concerning.  She  inquires about dietary options she can use to help her bowel function.  Prior work-up Colonoscopy 09/2016 - normal exam, external hemorrhoids -   [place] 01/15/19 - gallstones  HIDA - 01/24/19 - normal  CT abdomen / pelvis 08/18/17 - no acute changes, fatty atrophy of pancreatic head  Stool occult blood 05/17/20 - negative Celiac panel negative 09/23/19  EGD 07/19/20 -  - The exam of the esophagus was otherwise normal. No obvious erosive changes. - Biopsies were taken with a cold forceps in the upper third of the esophagus, in the middle third of the esophagus and in the lower third of the esophagus for histology to rule out eosinophilic esophagitis. - The entire examined stomach was normal. Biopsies were taken with a cold forceps for Helicobacter pylori testing. - Two medium to large diverticulum were found in the second portion of the duodenum. - The exam of the duodenum was otherwise normal.  1. Surgical [P], gastric antrum and body - ANTRAL AND OXYNTIC MUCOSA WITH HYPEREMIA. - WARTHIN-STARRY NEGATIVE FOR HELICOBACTER PYLORI. - NO INTESTINAL METAPLASIA, DYSPLASIA, OR CARCINOMA. 2. Surgical [P], random esophagus - UNREMARKABLE SQUAMOUS MUCOSA. - NO EOSINOPHILIC ESOPHAGITIS (LESS THAN 2 PER HIGH POWER FIELD).   Past Medical History:  Diagnosis Date  . Anxiety   . Back pain   . Biliary colic   . Constipation   .  Gallstones   . GERD (gastroesophageal reflux disease)   . High cholesterol   . High triglycerides   . History of sinus problem   . Hypertension   . IBS (irritable bowel syndrome)   . Joint pain   . LLQ pain 08/19/2014  . Plantar fasciitis   . SOB (shortness of breath)      Past Surgical History:  Procedure Laterality Date  . COLONOSCOPY N/A 09/06/2016   Procedure: COLONOSCOPY;  Surgeon: Malissa Hippo, MD;  Location: [facility];  Service: Endoscopy;  Laterality: N/A;  8:30  . FLEXIBLE SIGMOIDOSCOPY     Family History  Problem Relation Age of Onset  .  Lung cancer Mother   . Anxiety disorder Mother   . Alcoholism Mother   . Heart attack Father   . Sudden death Father   . Diverticulitis Paternal Grandmother   . Diverticulitis Brother        died from septic shock  . COPD Brother   . Breast cancer Daughter 29  . Other Brother        1/2 brother-  . Colon cancer Neg Hx    Social History   Tobacco Use  . Smoking status: Never Smoker  . Smokeless tobacco: Never Used  Vaping Use  . Vaping Use: Never used  Substance Use Topics  . Alcohol use: Yes    Comment: few drinks a year  . Drug use: No   Current Outpatient Medications  Medication Sig Dispense Refill  . ALPRAZolam (XANAX) 0.5 MG tablet Take 0.5 mg by mouth daily as needed.    Marland Kitchen CALCIUM PO Take by mouth. 1 chewable daily    . co-enzyme Q-10 30 MG capsule Take 30 mg by mouth 3 (three) times daily.    . hydrochlorothiazide (HYDRODIURIL) 25 MG tablet Take 25 mg by mouth daily.    Marland Kitchen linaclotide (LINZESS) 72 MCG capsule Take 1 capsule (72 mcg total) by mouth daily before breakfast. Lot: [id number], EXP: 01-2022 12 capsule 0  . polyethylene glycol (MIRALAX) 17 g packet Take once to twice daily as needed 14 each 0   No current facility-administered medications for this visit.   Allergies  Allergen Reactions  . Shrimp [Shellfish Allergy] Nausea And Vomiting  . Sulfa Antibiotics      Review of Systems: All systems reviewed and negative except where noted in HPI.   Lab Results  Component Value Date   WBC 5.5 09/18/2019   HGB 15.4 09/18/2019   HCT 45.6 (H) 09/18/2019   MCV 89.8 09/18/2019   PLT 242 09/18/2019    Lab Results  Component Value Date   CREATININE 0.64 11/23/2020   BUN 13 11/23/2020   NA 138 11/23/2020   K 4.7 11/23/2020   CL 98 11/23/2020   CO2 25 11/23/2020    Lab Results  Component Value Date   ALT 17 11/23/2020   AST 20 11/23/2020   ALKPHOS 103 11/23/2020   BILITOT 0.5 11/23/2020    Lab Results  Component Value Date   LIPASE 22 09/18/2019       Physical Exam: BP 116/74   Pulse 69   Ht 5\' 5"  (1.651 m)   Wt 166 lb (75.3 kg)   BMI 27.62 kg/m  Constitutional: Pleasant,well-developed, female in no acute distress. Neurological: Alert and oriented to person place and time. Psychiatric: Normal mood and affect. Behavior is normal.   ASSESSMENT AND PLAN: 65 year old female here for reassessment of the following:  Suspected IBS Lower abdominal discomfort  Unfortunately did not tolerate a TCA at her last visit, caused muscle aches, stopped it and it went away, this happened a few times when she tried it.  IBgard caused constipation.  She has been on Bentyl and Levsin in the past without improvement.  Low-dose MiraLAX not effective for her constipation, while Linzess at 145 mcg was too strong.  Discussed options for her.  Cramping not nearly as bad as it was previously but continues to have some constipation.  We discussed options to include higher dose MiraLAX dosed as twice daily may provide more benefit without the urgency of higher dose Linzess.  Alternatively I gave her a sample of lower dose Linzess at 72.5 mcg a day to see if that would be better tolerated.  She can try both of these and see which one she prefers.  For her pains if she wants we could try switching to a different type of TCA such as nortriptyline.  She wants to hold off on that for now.  We discussed options for management of her bowels with dietary changes.  While she does not have loose stools in light of her discomfort we could try low FODMAP diet and see if she has any trigger foods that we could identify.  Provided her some handouts on that today.  Otherwise reflux not bothersome to her much at this time, last EGD is reassuring, she can follow-up as needed for that issue.  Plan: - trial of Miralax BID - titrate as needed, and will give samples of Linzess 72.5 mcg to use daily or as needed, she can pursue whichever regimen she prefers - holding off on another  trial of TCA at this time - counseled on low FODMAP diet - can follow up as needed, contact me if symptoms persist or not happy with the regimen  Ileene Patrick, MD [facility]

## 2020-12-28 NOTE — Patient Instructions (Signed)
If you are age 64 or older, your body mass index should be between 23-30. Your Body mass index is 27.62 kg/m. If this is out of the aforementioned range listed, please consider follow up with your Primary Care Provider.  If you are age 63 or younger, your body mass index should be between 19-25. Your Body mass index is 27.62 kg/m. If this is out of the aformentioned range listed, please consider follow up with your Primary Care Provider.    We have given you samples of the following medication to take: Linzess 72 mcg: Take once daily before breakfast  (if you would like a prescription just let us know)  You could instead use Miralax twice a day, as directed  We are giving you a Low FOD-MAP diet to follow today.  Thank you for entrusting me with your care and for choosing [facility], Dr. Ileene Patrick

## 2021-01-06 ENCOUNTER — Ambulatory Visit (INDEPENDENT_AMBULATORY_CARE_PROVIDER_SITE_OTHER): Payer: 59 | Admitting: Adult Health

## 2021-01-09 ENCOUNTER — Ambulatory Visit
Admission: EM | Admit: 2021-01-09 | Discharge: 2021-01-09 | Disposition: A | Discharge status: Home / Self Care | Payer: 59 | Attending: Family Medicine | Admitting: Family Medicine

## 2021-01-09 ENCOUNTER — Other Ambulatory Visit: Payer: Self-pay

## 2021-01-09 ENCOUNTER — Encounter: Payer: Self-pay | Admitting: Emergency Medicine

## 2021-01-09 DIAGNOSIS — J028 Acute pharyngitis due to other specified organisms: Secondary | ICD-10-CM

## 2021-01-09 DIAGNOSIS — J019 Acute sinusitis, unspecified: Secondary | ICD-10-CM

## 2021-01-09 DIAGNOSIS — B9789 Other viral agents as the cause of diseases classified elsewhere: Secondary | ICD-10-CM | POA: Diagnosis not present

## 2021-01-09 DIAGNOSIS — R0982 Postnasal drip: Secondary | ICD-10-CM

## 2021-01-09 MED ORDER — LEVOCETIRIZINE DIHYDROCHLORIDE 5 MG PO TABS: 5.0000 mg | ORAL_TABLET | Freq: Every evening | ORAL | 0 refills | Status: DC

## 2021-01-09 MED ORDER — IPRATROPIUM BROMIDE 0.03 % NA SOLN: 2.0000 | Freq: Two times a day (BID) | NASAL | 0 refills | Status: DC

## 2021-01-09 MED ORDER — PROMETHAZINE-DM 6.25-15 MG/5ML PO SYRP: 5.0000 mL | ORAL_SOLUTION | Freq: Four times a day (QID) | ORAL | 0 refills | Status: DC | PRN

## 2021-01-09 MED ORDER — PREDNISONE 20 MG PO TABS: 20.0000 mg | ORAL_TABLET | Freq: Every day | ORAL | 0 refills | Status: AC

## 2021-01-09 NOTE — ED Provider Notes (Signed)
[facility]    CSN: [id number] Arrival date & time: 01/09/21  1122      History   Chief Complaint No chief complaint on file.   HPI Victoria Page is a 65 y.o. female.   HPI  Patient present today with body aches, nasal congestion,  sore throat, headache x 2 days. Reports a history of recurrent sinus problems. She does not take any antihistamines on routine basis. She has taken zyrtec and sudafed x 1 without relief of symptoms. Denies fever, shortness of breath or wheezing.   Past Medical History:  Diagnosis Date  . Anxiety   . Back pain   . Biliary colic   . Constipation   . Gallstones   . GERD (gastroesophageal reflux disease)   . High cholesterol   . High triglycerides   . History of sinus problem   . Hypertension   . IBS (irritable bowel syndrome)   . Joint pain   . LLQ pain 08/19/2014  . Plantar fasciitis   . SOB (shortness of breath)     Patient Active Problem List   Diagnosis Date Noted  . Overweight with body mass index (BMI) of 27 to 27.9 in adult 11/04/2020  . Irritable bowel syndrome with constipation 10/24/2020  . Cyst of ovary, right 09/28/2020  . Urinary frequency 09/10/2020  . Pelvic pain 09/10/2020  . Pelvic relaxation due to cystocele, midline 09/10/2020  . Class 1 obesity due to excess calories in adult 07/20/2020  . Fatigue 07/05/2020  . At risk for osteoporosis 07/05/2020  . Other hyperlipidemia 06/16/2020  . Insulin resistance 06/16/2020  . Vitamin D deficiency 06/16/2020  . At risk for heart disease 06/16/2020  . Encounter for screening fecal occult blood testing 05/17/2020  . History of UTI 05/17/2020  . Encounter for gynecological examination with Papanicolaou smear of cervix 05/17/2020  . Osteopenia 08/25/2019  . Gastroesophageal reflux disease 07/28/2019  . Essential hypertension 07/28/2019  . Anxiety 07/28/2019  . Hypertriglyceridemia 07/28/2019  . Sinus pressure 07/28/2019  . History of ovarian cyst 08/13/2018   . Screening for colorectal cancer 08/13/2018  . Routine adult health maintenance 08/13/2018  . Uterine tenderness 11/16/2017  . Vaginal atrophy 11/16/2017  . Vulvar irritation 11/16/2017  . UTI (urinary tract infection) 11/13/2017  . Encounter for screening colonoscopy 09/05/2016  . LLQ pain 08/19/2014  . CERVICAL RADICULITIS 06/12/2007  . SHOULDER PAIN 05/14/2007  . NECK PAIN, ACUTE 05/14/2007  . HIGH BLOOD PRESSURE 03/05/2007    Past Surgical History:  Procedure Laterality Date  . COLONOSCOPY N/A 09/06/2016   Procedure: COLONOSCOPY;  Surgeon: Malissa Hippo, MD;  [facility];  Service: Endoscopy;  Laterality: N/A;  8:30  . FLEXIBLE SIGMOIDOSCOPY      OB History    Gravida  5   Para  3   Term      Preterm      AB  1   Living  3     SAB  1   IAB      Ectopic      Multiple      Live Births               Home Medications    Prior to Admission medications   Medication Sig Start Date End Date Taking? Authorizing Provider  ALPRAZolam Prudy Feeler) 0.5 MG tablet Take 0.5 mg by mouth daily as needed. 03/31/20   [provider]  CALCIUM PO Take by mouth. 1 chewable daily  [provider]  co-enzyme Q-10 30 MG capsule Take 30 mg by mouth 3 (three) times daily.    [provider]  hydrochlorothiazide (HYDRODIURIL) 25 MG tablet Take 25 mg by mouth daily.    [provider]  linaclotide Karlene Einstein) 72 MCG capsule Take 1 capsule (72 mcg total) by mouth daily before breakfast. Lot: [id number], EXP: 01-2022 12/28/20   Armbruster, Willaim Rayas, MD  polyethylene glycol (MIRALAX) 17 g packet Take once to twice daily as needed 12/28/20   Armbruster, Willaim Rayas, MD    Family History Family History  Problem Relation Age of Onset  . Lung cancer Mother   . Anxiety disorder Mother   . Alcoholism Mother   . Heart attack Father   . Sudden death Father   . Diverticulitis Paternal Grandmother   . Diverticulitis Brother        died from septic  shock  . COPD Brother   . Breast cancer Daughter 43  . Other Brother        1/2 brother-  . Colon cancer Neg Hx     Social History Social History   Tobacco Use  . Smoking status: Never Smoker  . Smokeless tobacco: Never Used  Vaping Use  . Vaping Use: Never used  Substance Use Topics  . Alcohol use: Yes    Comment: few drinks a year  . Drug use: No     Allergies   Shrimp [shellfish allergy] and Sulfa antibiotics   Review of Systems Review of Systems Pertinent negatives listed in HPI   Physical Exam Triage Vital Signs ED Triage Vitals  Enc Vitals Group     BP 01/09/21 1136 122/84     Pulse Rate 01/09/21 1136 79     Resp 01/09/21 1136 18     Temp 01/09/21 1136 98.2 F (36.8 C)     Temp Source 01/09/21 1136 Oral     SpO2 01/09/21 1136 97 %     Weight --      Height --      Head Circumference --      Peak Flow --      Pain Score 01/09/21 1134 6     Pain Loc --      Pain Edu? --      Excl. in GC? --    No data found.  Updated Vital Signs BP 122/84 (BP Location: Right Arm)   Pulse 79   Temp 98.2 F (36.8 C) (Oral)   Resp 18   SpO2 97%   Visual Acuity Right Eye Distance:   Left Eye Distance:   Bilateral Distance:    Right Eye Near:   Left Eye Near:    Bilateral Near:     Physical Exam  General Appearance:    Alert, cooperative, no distress  HENT:   Normocephalic, ears normal, nares mucosal edema with congestion, oropharynx dry mucosa    Eyes:    PERRL, conjunctiva/corneas clear, EOM's intact       Lungs:     Clear to auscultation bilaterally, respirations unlabored  Heart:    Regular rate and rhythm  Neurologic:   Awake, alert, oriented x 3. No apparent focal neurological           defect.      UC Treatments / Results  Labs (all labs ordered are listed, but only abnormal results are displayed) Labs Reviewed - No data to display  EKG   Radiology No results found.  Procedures Procedures (including critical  care time)  Medications  Ordered in UC Medications - No data to display  Initial Impression / Assessment and Plan / UC Course  I have reviewed the triage vital signs and the nursing notes.  Pertinent labs & imaging results that were available during my care of the patient were reviewed by me and considered in my medical decision making (see chart for details).     Viral sinus with PND . Treatment per discharge medication orders. Advised to start daily year round antihistamine, Xyzal prescribed. PCP follow-up as needed or return if symptom worsen  Final Clinical Impressions(s) / UC Diagnoses   Final diagnoses:  Viral sinorhinitis  Post-nasal drainage  Sore throat (viral)   Discharge Instructions   None    ED Prescriptions    Medication Sig Dispense Auth. Provider   promethazine-dextromethorphan (PROMETHAZINE-DM) 6.25-15 MG/5ML syrup Take 5 mLs by mouth 4 (four) times daily as needed for cough. 140 mL Bing Neighbors, FNP   ipratropium (ATROVENT) 0.03 % nasal spray Place 2 sprays into both nostrils 2 (two) times daily. 30 mL Bing Neighbors, FNP   predniSONE (DELTASONE) 20 MG tablet Take 1 tablet (20 mg total) by mouth daily with breakfast for 5 days. 5 tablet Bing Neighbors, FNP   levocetirizine (XYZAL) 5 MG tablet Take 1 tablet (5 mg total) by mouth every evening. 90 tablet Bing Neighbors, FNP     PDMP not reviewed this encounter.   Bing Neighbors, FNP 01/09/21 [phone]

## 2021-01-09 NOTE — ED Triage Notes (Signed)
Body aches, sore throat, headache.  Neg home covid test.  Pt states she thinks she has a sinus infection.

## 2021-01-16 ENCOUNTER — Ambulatory Visit (INDEPENDENT_AMBULATORY_CARE_PROVIDER_SITE_OTHER): Payer: 59

## 2021-01-16 ENCOUNTER — Other Ambulatory Visit: Payer: Self-pay

## 2021-01-16 ENCOUNTER — Encounter: Payer: Self-pay | Admitting: Emergency Medicine

## 2021-01-16 ENCOUNTER — Ambulatory Visit
Admission: EM | Admit: 2021-01-16 | Discharge: 2021-01-16 | Disposition: A | Discharge status: Home / Self Care | Payer: 59 | Attending: Family Medicine | Admitting: Family Medicine

## 2021-01-16 DIAGNOSIS — M79644 Pain in right finger(s): Secondary | ICD-10-CM

## 2021-01-16 DIAGNOSIS — W231XXA Caught, crushed, jammed, or pinched between stationary objects, initial encounter: Secondary | ICD-10-CM | POA: Diagnosis not present

## 2021-01-16 DIAGNOSIS — J0140 Acute pansinusitis, unspecified: Secondary | ICD-10-CM

## 2021-01-16 DIAGNOSIS — M79645 Pain in left finger(s): Secondary | ICD-10-CM

## 2021-01-16 LAB — POCT RAPID STREP A (OFFICE): Rapid Strep A Screen: NEGATIVE

## 2021-01-16 MED ORDER — FLUCONAZOLE 150 MG PO TABS: ORAL_TABLET | ORAL | 0 refills | Status: DC

## 2021-01-16 MED ORDER — AMOXICILLIN-POT CLAVULANATE 875-125 MG PO TABS: 1.0000 | ORAL_TABLET | Freq: Two times a day (BID) | ORAL | 0 refills | Status: AC

## 2021-01-16 MED ORDER — BENZONATATE 100 MG PO CAPS: 100.0000 mg | ORAL_CAPSULE | Freq: Three times a day (TID) | ORAL | 0 refills | Status: DC

## 2021-01-16 NOTE — ED Triage Notes (Signed)
Was seen last Sunday for sinus and states she is no better.  C/o sore throat, headache, nasal congestion.  States she has been exposed to strep throat.

## 2021-01-16 NOTE — Discharge Instructions (Addendum)
I have sent in Augmentin for you to take twice a day for 7 days.  I have sent in fluconazole in case of yeast. Take one tablet at the onset of symptoms. If symptoms are still present in 3 days, take the second tablet.   I have sent in tessalon perles for you to use one capsule every 8 hours as needed for cough.  Xray is negative for fracture or misalignment  Follow up with this office or with primary care if symptoms are persisting.  Follow up in the ER for high fever, trouble swallowing, trouble breathing, other concerning symptoms.

## 2021-01-16 NOTE — ED Provider Notes (Signed)
[facility]    CSN: [id number] Arrival date & time: 01/16/21  1304      History   Chief Complaint No chief complaint on file.   HPI Victoria Page is a 65 y.o. female.   Reports sinus pain, congestion, sore throat, headache for the last week. Was seen in this office x 7 days ago for the same. Also reports that she closed her right middle finger in a door about a month ago. States that the area is still painful, swollen and very stiff. Has tried OTC pain reliever with temporary relief. Was treated with Atrovent, prednisone, xyzal, and cough syrup. Denies Covid exposure. Has not completed Covid vaccines. Has not completed flu vaccine. Denies chills, body aches, abdominal pain, nausea, vomiting, diarrhea, rash, fever, other symptoms.   ROS per HPI  The history is provided by the patient.    Past Medical History:  Diagnosis Date  . Anxiety   . Back pain   . Biliary colic   . Constipation   . Gallstones   . GERD (gastroesophageal reflux disease)   . High cholesterol   . High triglycerides   . History of sinus problem   . Hypertension   . IBS (irritable bowel syndrome)   . Joint pain   . LLQ pain 08/19/2014  . Plantar fasciitis   . SOB (shortness of breath)     Patient Active Problem List   Diagnosis Date Noted  . Acute non-recurrent pansinusitis 01/17/2021  . Recurrent sinusitis 01/17/2021  . Overweight with body mass index (BMI) of 27 to 27.9 in adult 11/04/2020  . Irritable bowel syndrome with constipation 10/24/2020  . Cyst of ovary, right 09/28/2020  . Urinary frequency 09/10/2020  . Pelvic pain 09/10/2020  . Pelvic relaxation due to cystocele, midline 09/10/2020  . Class 1 obesity due to excess calories in adult 07/20/2020  . Fatigue 07/05/2020  . At risk for osteoporosis 07/05/2020  . Other hyperlipidemia 06/16/2020  . Insulin resistance 06/16/2020  . Vitamin D deficiency 06/16/2020  . At risk for heart disease 06/16/2020  . Encounter for  screening fecal occult blood testing 05/17/2020  . History of UTI 05/17/2020  . Encounter for gynecological examination with Papanicolaou smear of cervix 05/17/2020  . Osteopenia 08/25/2019  . Gastroesophageal reflux disease 07/28/2019  . Essential hypertension 07/28/2019  . Anxiety 07/28/2019  . Hypertriglyceridemia 07/28/2019  . Sinus pressure 07/28/2019  . History of ovarian cyst 08/13/2018  . Screening for colorectal cancer 08/13/2018  . Routine adult health maintenance 08/13/2018  . Uterine tenderness 11/16/2017  . Vaginal atrophy 11/16/2017  . Vulvar irritation 11/16/2017  . UTI (urinary tract infection) 11/13/2017  . Encounter for screening colonoscopy 09/05/2016  . LLQ pain 08/19/2014  . CERVICAL RADICULITIS 06/12/2007  . SHOULDER PAIN 05/14/2007  . NECK PAIN, ACUTE 05/14/2007  . HIGH BLOOD PRESSURE 03/05/2007    Past Surgical History:  Procedure Laterality Date  . COLONOSCOPY N/A 09/06/2016   Procedure: COLONOSCOPY;  Surgeon: Malissa Hippo, MD;  Location[facility];  Service: Endoscopy;  Laterality: N/A;  8:30  . FLEXIBLE SIGMOIDOSCOPY      OB History    Gravida  5   Para  3   Term      Preterm      AB  1   Living  3     SAB  1   IAB      Ectopic      Multiple      Live  Births               Home Medications    Prior to Admission medications   Medication Sig Start Date End Date Taking? Authorizing Provider  amoxicillin-clavulanate (AUGMENTIN) 875-125 MG tablet Take 1 tablet by mouth 2 (two) times daily for 7 days. 01/16/21 01/23/21 Yes Moshe CiproMatthews, Sharese Manrique, NP  benzonatate (TESSALON) 100 MG capsule Take 1 capsule (100 mg total) by mouth every 8 (eight) hours. 01/16/21  Yes Moshe CiproMatthews, Ajanay Farve, NP  ALPRAZolam Prudy Feeler(XANAX) 0.5 MG tablet Take 0.5 mg by mouth daily as needed. 03/31/20   [provider]  CALCIUM PO Take by mouth. 1 chewable daily    [provider]  co-enzyme Q-10 30 MG capsule Take 30 mg by mouth 3 (three) times  daily.    [provider]  hydrochlorothiazide (HYDRODIURIL) 25 MG tablet Take 25 mg by mouth daily.    [provider]  ipratropium (ATROVENT) 0.03 % nasal spray Place 2 sprays into both nostrils 2 (two) times daily. 01/09/21   Bing NeighborsHarris, Kimberly S, FNP    Family History Family History  Problem Relation Age of Onset  . Lung cancer Mother   . Anxiety disorder Mother   . Alcoholism Mother   . Heart attack Father   . Sudden death Father   . Diverticulitis Paternal Grandmother   . Diverticulitis Brother        died from septic shock  . COPD Brother   . Breast cancer Daughter [id number]  . Other Brother        1/2 brother-  . Colon cancer Neg Hx     Social History Social History   Tobacco Use  . Smoking status: Never Smoker  . Smokeless tobacco: Never Used  Vaping Use  . Vaping Use: Never used  Substance Use Topics  . Alcohol use: Yes    Comment: few drinks a year  . Drug use: No     Allergies   Shrimp [shellfish allergy] and Sulfa antibiotics   Review of Systems Review of Systems   Physical Exam Triage Vital Signs ED Triage Vitals  Enc Vitals Group     BP 01/16/21 1356 123/85     Pulse Rate 01/16/21 1356 69     Resp 01/16/21 1356 16     Temp 01/16/21 1356 98 F (36.7 C)     Temp Source 01/16/21 1356 Oral     SpO2 01/16/21 1356 97 %     Weight --      Height --      Head Circumference --      Peak Flow --      Pain Score 01/16/21 1407 7     Pain Loc --      Pain Edu? --      Excl. in GC? --    No data found.  Updated Vital Signs BP 123/85 (BP Location: Right Arm)   Pulse 69   Temp 98 F (36.7 C) (Oral)   Resp 16   SpO2 97%      Physical Exam Vitals and nursing note reviewed.  Constitutional:      General: She is not in acute distress.    Appearance: Normal appearance. She is well-developed and normal weight. She is ill-appearing.  HENT:     Head: Normocephalic and atraumatic.     Right Ear: Tympanic membrane, ear canal and external  ear normal.     Left Ear: Tympanic membrane, ear canal and external ear normal.  Nose: Congestion present.     Mouth/Throat:     Mouth: Mucous membranes are moist.     Pharynx: Posterior oropharyngeal erythema present.  Eyes:     Extraocular Movements: Extraocular movements intact.     Conjunctiva/sclera: Conjunctivae normal.     Pupils: Pupils are equal, round, and reactive to light.  Cardiovascular:     Rate and Rhythm: Normal rate and regular rhythm.     Heart sounds: Normal heart sounds. No murmur heard.   Pulmonary:     Effort: Pulmonary effort is normal. No respiratory distress.     Breath sounds: Normal breath sounds. No stridor. No wheezing, rhonchi or rales.  Chest:     Chest wall: No tenderness.  Abdominal:     General: Bowel sounds are normal.     Palpations: Abdomen is soft.     Tenderness: There is no abdominal tenderness.  Musculoskeletal:        General: Swelling and tenderness present.     Cervical back: Normal range of motion and neck supple.     Comments: PIP joint of R middle finger swollen and tender to touch. No erythema noted  Lymphadenopathy:     Cervical: Cervical adenopathy present.  Skin:    General: Skin is warm and dry.     Capillary Refill: Capillary refill takes less than 2 seconds.  Neurological:     General: No focal deficit present.     Mental Status: She is alert and oriented to person, place, and time.  Psychiatric:        Mood and Affect: Mood normal.        Thought Content: Thought content normal.      UC Treatments / Results  Labs (all labs ordered are listed, but only abnormal results are displayed) Labs Reviewed  POCT RAPID STREP A (OFFICE)    EKG   Radiology DG Finger Middle Right  Result Date: 01/16/2021 CLINICAL DATA:  PIP joint pain of right middle finger after closing it in a door a week ago EXAM: RIGHT MIDDLE FINGER 2+V COMPARISON:  None. FINDINGS: There is no evidence of fracture or dislocation. There is a tiny  osteophyte at the DIP joint, likely degenerative. There is no evidence of arthropathy or other focal bone abnormality. Soft tissues are unremarkable. IMPRESSION: No acute osseous abnormality in the right middle finger. Electronically Signed   By: Emmaline Kluver M.D.   On: 01/16/2021 14:55    Procedures Procedures (including critical care time)  Medications Ordered in UC Medications - No data to display  Initial Impression / Assessment and Plan / UC Course  I have reviewed the triage vital signs and the nursing notes.  Pertinent labs & imaging results that were available during my care of the patient were reviewed by me and considered in my medical decision making (see chart for details).    Pansinusitis Right finger pain  Prescribed Augmentin 875mg  BID x 7 days Prescribed fluconazole in case of yeast Prescribed tessalon perles prn cough Xray of R middle finger negative for fracture or misalignment today Follow up with this office or with primary care if symptoms are persisting. Follow up in the ER for high fever, trouble swallowing, trouble breathing, other concerning symptoms.   Final Clinical Impressions(s) / UC Diagnoses   Final diagnoses:  Acute non-recurrent pansinusitis  Finger pain, right     Discharge Instructions     I have sent in Augmentin for you to take twice a day for 7 days.  I have sent in fluconazole in case of yeast. Take one tablet at the onset of symptoms. If symptoms are still present in 3 days, take the second tablet.   I have sent in tessalon perles for you to use one capsule every 8 hours as needed for cough.  Xray is negative for fracture or misalignment  Follow up with this office or with primary care if symptoms are persisting.  Follow up in the ER for high fever, trouble swallowing, trouble breathing, other concerning symptoms.     ED Prescriptions    Medication Sig Dispense Auth. Provider   amoxicillin-clavulanate (AUGMENTIN)  875-125 MG tablet Take 1 tablet by mouth 2 (two) times daily for 7 days. 14 tablet Moshe Cipro, NP   fluconazole (DIFLUCAN) 150 MG tablet Take one tablet at the onset of symptoms. If symptoms are still present 3 days later, take the second tablet. 2 tablet Moshe Cipro, NP   benzonatate (TESSALON) 100 MG capsule Take 1 capsule (100 mg total) by mouth every 8 (eight) hours. 21 capsule Moshe Cipro, NP     PDMP not reviewed this encounter.   Moshe Cipro, NP 01/18/21 [phone]

## 2021-01-17 ENCOUNTER — Ambulatory Visit (INDEPENDENT_AMBULATORY_CARE_PROVIDER_SITE_OTHER): Payer: 59 | Admitting: Adult Health

## 2021-01-17 ENCOUNTER — Encounter (INDEPENDENT_AMBULATORY_CARE_PROVIDER_SITE_OTHER): Payer: Self-pay | Admitting: Adult Health

## 2021-01-17 ENCOUNTER — Other Ambulatory Visit: Payer: Self-pay

## 2021-01-17 VITALS — BP 123/81 | HR 74 | Temp 98.3°F | Ht 65.0 in | Wt 162.0 lb

## 2021-01-17 DIAGNOSIS — I10 Essential (primary) hypertension: Secondary | ICD-10-CM

## 2021-01-17 DIAGNOSIS — J329 Chronic sinusitis, unspecified: Secondary | ICD-10-CM | POA: Insufficient documentation

## 2021-01-17 DIAGNOSIS — Z9189 Other specified personal risk factors, not elsewhere classified: Secondary | ICD-10-CM

## 2021-01-17 DIAGNOSIS — E663 Overweight: Secondary | ICD-10-CM | POA: Diagnosis not present

## 2021-01-17 DIAGNOSIS — J0140 Acute pansinusitis, unspecified: Secondary | ICD-10-CM | POA: Diagnosis not present

## 2021-01-17 DIAGNOSIS — Z6827 Body mass index (BMI) 27.0-27.9, adult: Secondary | ICD-10-CM

## 2021-01-18 NOTE — Progress Notes (Signed)
Chief Complaint:   OBESITY Victoria Page is here to discuss her progress with her obesity treatment plan along with follow-up of her obesity related diagnoses. Victoria Page is on the Category 2 Plan and states she is following her eating plan approximately 40% of the time. [name] states she is not currently exercising.  Today's visit was #: 19 Starting weight: 175 lbs Starting date: 02/18/2020 Today's weight: 162 lbs Today's date: 01/17/2021 Total lbs lost to date: 13 Total lbs lost since last in-office visit: 0  Interim History: [name] has been acutely ill for over 2 weeks- pharyngitis, burning eyes, cough, nasal drainage. She denies fever or nausea. She started an ABX and Tessalon perles. She reports a decrease in symptoms already. She has been following an abridged plan due to poor appetite.  Subjective:   1. Acute non-recurrent pansinusitis Strep test negative 01/16/2021. Home antigen COVID-19 test negative.  Victoria Page was seen at Urgent Care and started on Augmentin and Tessalon pearls- 01/16/2021.  2. Recurrent sinusitis [name] estimates to experience sinusitis at least twice a year.  Recurrent strep throat infections annually.  Never been seen by ENT- she has agreed to a referral.  3. Essential hypertension BP/HR excellent at OV.  Victoria Page is on HCTZ 25 mg QD.  BP Readings from Last 3 Encounters:  01/17/21 123/81  01/16/21 123/85  01/09/21 122/84   4. At risk for deficient intake of food [name] is at risk for deficient intake of food due to acute illness and poor appetite.  Assessment/Plan:   1. Acute non-recurrent pansinusitis Continue current tx plan. Refer to ENT.  2. Recurrent sinusitis Refer to ENT. - Ambulatory referral to ENT  3. Essential hypertension [name] is working on healthy weight loss and exercise to improve blood pressure control. We will watch for signs of hypotension as she continues her lifestyle modifications. -Continue HCTZ 25 mg  QD.  4. At risk for deficient intake of food [name] was given approximately 15 minutes of deficit intake of food prevention counseling today. Victoria Page is at risk for eating too few calories based on current food recall. She was encouraged to focus on meeting caloric and protein goals according to her recommended meal plan.   5. Overweight with body mass index (BMI) of 27 to 27.9 in adult  [name] is currently in the action stage of change. As such, her goal is to continue with weight loss efforts. She has agreed to the Category 2 Plan.   Exercise goals: No exercise has been prescribed at this time.  Behavioral modification strategies: increasing lean protein intake, decreasing simple carbohydrates, meal planning and cooking strategies and planning for success.  Darbi has agreed to follow-up with our clinic in 3-4 weeks. She was informed of the importance of frequent follow-up visits to maximize her success with intensive lifestyle modifications for her multiple health conditions.   Objective:   Blood pressure 123/81, pulse 74, temperature 98.3 F (36.8 C), height 5\' 5"  (1.651 m), weight 162 lb (73.5 kg), SpO2 98 %. Body mass index is 26.96 kg/m.  General: Cooperative, alert, well developed, in no acute distress. HEENT: Conjunctivae and lids unremarkable. Cardiovascular: Regular rhythm.  Lungs: Normal work of breathing. Neurologic: No focal deficits.   Lab Results  Component Value Date   CREATININE 0.64 11/23/2020   BUN 13 11/23/2020   NA 138 11/23/2020   K 4.7 11/23/2020   CL 98 11/23/2020   CO2 25 11/23/2020   Lab Results  Component Value Date   ALT 17  11/23/2020   AST 20 11/23/2020   ALKPHOS 103 11/23/2020   BILITOT 0.5 11/23/2020   Lab Results  Component Value Date   HGBA1C 5.4 11/23/2020   HGBA1C 5.3 02/18/2020   Lab Results  Component Value Date   INSULIN 4.8 11/23/2020   INSULIN 9.9 07/05/2020   INSULIN 11.4 02/18/2020   Lab Results  Component Value  Date   TSH 1.830 02/18/2020   Lab Results  Component Value Date   CHOL 239 (H) 11/23/2020   HDL 60 11/23/2020   LDLCALC 153 (H) 11/23/2020   TRIG 148 11/23/2020   CHOLHDL 4.0 11/23/2020   Lab Results  Component Value Date   WBC 5.5 09/18/2019   HGB 15.4 09/18/2019   HCT 45.6 (H) 09/18/2019   MCV 89.8 09/18/2019   PLT 242 09/18/2019    Attestation Statements:   Reviewed by clinician on day of visit: allergies, medications, problem list, medical history, surgical history, family history, social history, and previous encounter notes.  Edmund Hilda, CMA, am acting as transcriptionist for William Hamburger, NP.  I have reviewed the above documentation for accuracy and completeness, and I agree with the above. -  Johndaniel Catlin d. Meya Clutter, NP-C

## 2021-02-04 ENCOUNTER — Ambulatory Visit: Payer: 59 | Admitting: Orthopedic Surgery

## 2021-02-08 ENCOUNTER — Ambulatory Visit (INDEPENDENT_AMBULATORY_CARE_PROVIDER_SITE_OTHER): Payer: 59 | Admitting: Adult Health

## 2021-02-08 ENCOUNTER — Encounter (INDEPENDENT_AMBULATORY_CARE_PROVIDER_SITE_OTHER): Payer: Self-pay | Admitting: Adult Health

## 2021-02-08 ENCOUNTER — Other Ambulatory Visit: Payer: Self-pay

## 2021-02-08 VITALS — BP 151/85 | HR 72 | Temp 98.1°F | Ht 65.0 in | Wt 164.0 lb

## 2021-02-08 DIAGNOSIS — E6609 Other obesity due to excess calories: Secondary | ICD-10-CM

## 2021-02-08 DIAGNOSIS — I10 Essential (primary) hypertension: Secondary | ICD-10-CM

## 2021-02-08 DIAGNOSIS — E559 Vitamin D deficiency, unspecified: Secondary | ICD-10-CM | POA: Diagnosis not present

## 2021-02-08 DIAGNOSIS — Z6830 Body mass index (BMI) 30.0-30.9, adult: Secondary | ICD-10-CM | POA: Diagnosis not present

## 2021-02-09 ENCOUNTER — Ambulatory Visit: Payer: 59

## 2021-02-09 ENCOUNTER — Encounter: Payer: Self-pay | Admitting: Orthopedic Surgery

## 2021-02-09 ENCOUNTER — Ambulatory Visit (INDEPENDENT_AMBULATORY_CARE_PROVIDER_SITE_OTHER): Payer: 59 | Admitting: Orthopedic Surgery

## 2021-02-09 VITALS — BP 140/99 | HR 70 | Ht 64.75 in | Wt 166.8 lb

## 2021-02-09 DIAGNOSIS — G8929 Other chronic pain: Secondary | ICD-10-CM

## 2021-02-09 DIAGNOSIS — M25512 Pain in left shoulder: Secondary | ICD-10-CM | POA: Diagnosis not present

## 2021-02-09 DIAGNOSIS — M25511 Pain in right shoulder: Secondary | ICD-10-CM

## 2021-02-09 NOTE — Progress Notes (Signed)
New Patient Visit  Assessment: Victoria Page is a 65 y.o. female with the following: Bilateral rotator cuff tendinitis, right worse than left Right long finger dorsal PIP cyst  Plan: We had an extensive discussion with the patient in clinic today in regards to her right shoulder.  She also has some pain in the left shoulder, but it is not as severe as the right currently.  She can continue to take Tylenol as needed.  She can also use topical ointments.  I have also recommended Voltaren gel, which she states she will try.  She is also interested in physical therapy, and we placed a referral today.  In regards to her sleep, we discussed starting with Benadryl to assist with getting sleep.  If she continues to have issues, we can discuss an injection in more detail.  All questions were answered and she is amenable to this plan.  In regards to her other complaints such as the cyst on the right long finger, we briefly discussed this and potential treatments.  Nothing is needed at this time.  She also states that she has a trigger finger on her left hand.  When she is able, she will schedule a follow-up appointment to discuss this in more detail.  Follow-up as needed.   Follow-up: Return if symptoms worsen or fail to improve.  Subjective:  Chief Complaint  Patient presents with  . Shoulder Pain    Bilateral shoulder pain R>L interferes with sleep    History of Present Illness: Victoria Page is a 65 y.o. female who has been referred to clinic today fby Marylynn Pearson, FNP for evaluation of bilateral shoulder pain.  She has a number of orthopedic complaints at this time, we have chosen to focus on the shoulders at this time.  She states she has pain in both of her shoulders, but the right is much worse than the left currently.  No specific injuries.  Her pain is diffuse, and limits her ability to fulfill daily activities.  She also notes that her pain gets much worse at nighttime.  This is  affecting her sleep.  She is taking Tylenol.  Occasionally she will take an NSAID.  No physical therapy.  She is not had an injection in her shoulder.   Review of Systems: No fevers or chills No numbness or tingling No chest pain No shortness of breath No bowel or bladder dysfunction No GI distress No headaches   Medical History:  Past Medical History:  Diagnosis Date  . Anxiety   . Back pain   . Biliary colic   . Constipation   . Gallstones   . GERD (gastroesophageal reflux disease)   . High cholesterol   . High triglycerides   . History of sinus problem   . Hypertension   . IBS (irritable bowel syndrome)   . Joint pain   . LLQ pain 08/19/2014  . Plantar fasciitis   . SOB (shortness of breath)     Past Surgical History:  Procedure Laterality Date  . COLONOSCOPY N/A 09/06/2016   Procedure: COLONOSCOPY;  Surgeon: Malissa Hippo, MD;  [facility];  Service: Endoscopy;  Laterality: N/A;  8:30  . FLEXIBLE SIGMOIDOSCOPY      Family History  Problem Relation Age of Onset  . Lung cancer Mother   . Anxiety disorder Mother   . Alcoholism Mother   . Heart attack Father   . Sudden death Father   . Diverticulitis Paternal Grandmother   .  Diverticulitis Brother        died from septic shock  . COPD Brother   . Breast cancer Daughter 74  . Other Brother        1/2 brother-  . Colon cancer Neg Hx    Social History   Tobacco Use  . Smoking status: Never Smoker  . Smokeless tobacco: Never Used  Vaping Use  . Vaping Use: Never used  Substance Use Topics  . Alcohol use: Yes    Comment: few drinks a year  . Drug use: No    Allergies  Allergen Reactions  . Shrimp [Shellfish Allergy] Nausea And Vomiting  . Sulfa Antibiotics     Current Meds  Medication Sig  . ALPRAZolam (XANAX) 0.5 MG tablet Take 0.5 mg by mouth daily as needed.  . benzonatate (TESSALON) 100 MG capsule Take 1 capsule (100 mg total) by mouth every 8 (eight) hours.  Marland Kitchen CALCIUM PO Take  by mouth. 1 chewable daily  . co-enzyme Q-10 30 MG capsule Take 30 mg by mouth 3 (three) times daily.  . hydrochlorothiazide (HYDRODIURIL) 25 MG tablet Take 25 mg by mouth daily.  Marland Kitchen ipratropium (ATROVENT) 0.03 % nasal spray Place 2 sprays into both nostrils 2 (two) times daily.    Objective: BP (!) 140/99   Pulse 70   Ht 5' 4.75" (1.645 m)   Wt 166 lb 12.8 oz (75.7 kg)   BMI 27.97 kg/m   Physical Exam:  General: Alert and oriented.  No acute distress. Gait: Normal  Evaluation of the right shoulder demonstrates no deformity.  No atrophy is appreciated.  Range of motion is pretty good, with some discomfort.  Internal rotation of the lumbar spine.  Negative belly press.  Tenderness to palpation over the anterior shoulder in line with the bicipital groove.  Positive speeds.  Positive O'Brien's.  Strength is 5/5 throughout, with some discomfort.  Evaluation left shoulder demonstrates no deformity.  No atrophy is appreciated.  Range of motion is very good, with minimal discomfort.  Internal rotation to T12.  Negative belly press.  Mild tenderness palpation over the anterior shoulder.  Strength is 5/5 throughout.  Sensation intact distally.  Evaluation of the long finger of the right hand demonstrates a dorsal cyst of the PIP joint.  This is mildly tender to palpation.  No overlying skin changes.    IMAGING: I personally ordered and reviewed the following images   X-rays of bilateral shoulders were obtained in clinic today.  No acute injuries are noted.  Well-maintained glenohumeral joint space.  No proximal humeral migration.  Minimal degenerative changes otherwise.  Impression: Normal bilateral shoulder x-rays   New Medications:  No orders of the defined types were placed in this encounter.     Oliver Barre, MD  02/09/2021 9:16 PM

## 2021-02-14 NOTE — Progress Notes (Signed)
Chief Complaint:   OBESITY Victoria Page is here to discuss her progress with her obesity treatment plan along with follow-up of her obesity related diagnoses. Victoria Page is on the Category 2 Plan and states she is following her eating plan approximately 50% of the time. Victoria Page states she is not currently exercising.   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  SBP slightly elevated at OV. She was worried about her weight today and experiencing increased right shoulder pain at present.  Of Note: recently referred to ENT- new pt appt scheduled for 02/17/2021.  Subjective:   1. Vitamin D deficiency Victoria Page's Vitamin D level was 40.1 on 11/23/2020- below goal of 50. She is currently taking no vitamin D supplement. She denies nausea, vomiting or muscle weakness.   2. Essential hypertension Victoria Page has been on HCTZ for over 20 years. She is ow on max dose at 20 mg QD. BP elevated at OV. She reports taking HCTZ 25 mg this morning. She denies cardiac symptoms. She is experiencing increased bilateral shoulder pain- R more than L. 6/10 R shoulder pain at present, described as sharp/stabbing. Her last several BP's were at goal.  BP Readings from Last 3 Encounters:  02/09/21 (!) 140/99  02/08/21 (!) 151/85  01/17/21 123/81     Assessment/Plan:   1. Vitamin D deficiency Low Vitamin D level contributes to fatigue and are associated with obesity, breast, and colon cancer. She agrees to start OTC Women's Multivitamin QD and will follow-up for routine testing of Vitamin D, at least 2-3 times per year to avoid over-replacement.   2. Essential hypertension Victoria Page is working on healthy weight loss and exercise to improve blood pressure control. We  will watch for signs of hypotension as she continues her lifestyle modifications. -Continue HCT. -Monitor ambulatory BP and bring log to next OV.  3. Obesity with current BMI 27.3  Victoria Page is currently in the action stage of change. As such, her goal is to continue with weight loss efforts. She has agreed to the Category 2 Plan.   Handout: Additional Breakfast Options  Exercise goals:  Increase daily walking.  Behavioral modification strategies: increasing lean protein intake, decreasing simple carbohydrates, meal planning and cooking strategies, keeping healthy foods in the home, and planning for success.  Victoria Page has agreed to follow-up with our clinic in 3 weeks. She was informed of the importance of frequent follow-up visits to maximize her success with intensive lifestyle modifications for her multiple health conditions.   Objective:   Blood pressure (!) 151/85, pulse 72, temperature 98.1 F (36.7 C), height 5\' 5"  (1.651 m), weight 164 lb (74.4 kg), SpO2 98 %. Body mass index is 27.29 kg/m.  General: Cooperative, alert, well developed, in no acute distress. HEENT: Conjunctivae and lids unremarkable. Cardiovascular: Regular rhythm.  Lungs: Normal work of breathing. Neurologic: No focal deficits.   Lab Results  Component Value Date   CREATININE 0.64 11/23/2020   BUN 13 11/23/2020   NA 138 11/23/2020   K 4.7 11/23/2020   CL 98 11/23/2020   CO2 25 11/23/2020   Lab Results  Component Value Date   ALT 17 11/23/2020   AST 20 11/23/2020   ALKPHOS 103 11/23/2020   BILITOT 0.5 11/23/2020   Lab Results  Component Value Date   HGBA1C 5.4 11/23/2020   HGBA1C 5.3 02/18/2020   Lab Results  Component Value Date   INSULIN 4.8 11/23/2020   INSULIN 9.9 07/05/2020   INSULIN 11.4 02/18/2020   Lab Results  Component Value Date   TSH 1.830 02/18/2020   Lab Results  Component Value Date   CHOL 239 (H) 11/23/2020   HDL 60 11/23/2020   LDLCALC 153 (H) 11/23/2020   TRIG  148 11/23/2020   CHOLHDL 4.0 11/23/2020   Lab Results  Component Value Date   WBC 5.5 09/18/2019   HGB 15.4 09/18/2019   HCT 45.6 (H) 09/18/2019   MCV 89.8 09/18/2019   PLT 242 09/18/2019   No results found for: IRON, TIBC, FERRITIN   Attestation Statements:   Reviewed by clinician on day of visit: allergies, medications, problem list, medical history, surgical history, family history, social history, and previous encounter notes.  Time spent on visit including pre-visit chart review and post-visit care and charting was 32 minutes.   Edmund Hilda, CMA, am acting as transcriptionist for William Hamburger, NP.  I have reviewed the above documentation for accuracy and completeness, and I agree with the above. -  Kyrin Gratz d. Tyshea Imel, NP-C

## 2021-02-16 ENCOUNTER — Other Ambulatory Visit: Payer: Self-pay

## 2021-02-16 ENCOUNTER — Encounter (HOSPITAL_COMMUNITY): Payer: Self-pay

## 2021-02-16 ENCOUNTER — Ambulatory Visit (HOSPITAL_COMMUNITY): Payer: 59 | Attending: Orthopedic Surgery

## 2021-02-16 DIAGNOSIS — M25511 Pain in right shoulder: Secondary | ICD-10-CM | POA: Diagnosis present

## 2021-02-16 DIAGNOSIS — M25512 Pain in left shoulder: Secondary | ICD-10-CM | POA: Diagnosis present

## 2021-02-16 DIAGNOSIS — M25612 Stiffness of left shoulder, not elsewhere classified: Secondary | ICD-10-CM

## 2021-02-16 DIAGNOSIS — R29898 Other symptoms and signs involving the musculoskeletal system: Secondary | ICD-10-CM

## 2021-02-16 DIAGNOSIS — M25611 Stiffness of right shoulder, not elsewhere classified: Secondary | ICD-10-CM | POA: Insufficient documentation

## 2021-02-16 NOTE — Therapy (Addendum)
[facility] [address] [place], [place], [address] Phone: [phone]   Fax:  [phone]  Occupational Therapy Evaluation  Patient Details  Name: Victoria HelperVictoria A Delaney MRN: [id number] Date of Birth: 06/10/1956 Referring Provider (OT): Thane EduMark Cairns, MD   Encounter Date: 02/16/2021   OT End of Session - 02/16/21 1718     Visit Number 1    Number of Visits 4    Date for OT Re-Evaluation 03/16/21    Authorization Type [employer]are    Authorization Time Period visit limit 30 PT/OT/Chiro 0 used. No copay 10% co-insurance    Authorization - Visit Number 1    Authorization - Number of Visits 30    OT Start Time 1525   pt checked in late   OT Stop Time 1600    OT Time Calculation (min) 35 min    Activity Tolerance Patient tolerated treatment well    Behavior During Therapy WFL for tasks assessed/performed             Past Medical History:  Diagnosis Date   Anxiety    Back pain    Biliary colic    Constipation    Gallstones    GERD (gastroesophageal reflux disease)    High cholesterol    High triglycerides    History of sinus problem    Hypertension    IBS (irritable bowel syndrome)    Joint pain    LLQ pain 08/19/2014   Plantar fasciitis    SOB (shortness of breath)     Past Surgical History:  Procedure Laterality Date   COLONOSCOPY N/A 09/06/2016   Procedure: COLONOSCOPY;  Surgeon: Malissa HippoNajeeb U Rehman, MD;  [facility];  Service: Endoscopy;  Laterality: N/A;  8:30   FLEXIBLE SIGMOIDOSCOPY      There were no vitals filed for this visit.    02/16/21 1529  Symptoms/Limitations  Subjective  S: My right is worse. It's worse at night though. I don't have any pain right now.  Pertinent History Patient is a 65 y/o female S/P bilateral chronic shoulder pain with right worse than the left. Dr. Dallas Schimkeairns has referred patient to occupational therapy for evaluation and treatment.  Patient Stated Goals to have less  pain in both arms  Pain Assessment  Currently in Pain? Other (Comment) (Nothing at time of evaluation. At night is the worst. R: 10/10, L; 3/10. During the day if attempting to use her arms her pain will increase to 2/10.)     [facility] - 02/16/21 1532       Assessment   Medical Diagnosis bilateral RTC tendinitis    Referring Provider (OT) Thane EduMark Cairns, MD    Onset Date/Surgical Date --   at least 3 months ago   Hand Dominance Right    Next MD Visit After she finishes therapy    Prior Therapy right knee outpatient for patellar fracture      Precautions   Precautions None      Restrictions   Weight Bearing Restrictions No      Balance Screen   Has the patient fallen in the past 6 months No      Home  Environment   Family/patient expects to be discharged to: Private residence      Prior Function   Level of Independence Independent      ADL   ADL comments Difficulty with lifting right UE overhead, discomfort and pain when trying to sleep (average: 5-6  hours of good quality sleep).      Written Expression   Dominant Hand Right      Vision - History   Baseline Vision Wears glasses all the time      Cognition   Overall Cognitive Status Within Functional Limits for tasks assessed      Observation/Other Assessments   Focus on Therapeutic Outcomes (FOTO)  52/100      ROM / Strength   AROM / PROM / Strength AROM;PROM;Strength      Palpation   Palpation comment moderate bilateral fascial restrictions in the upper arm, upper trapezius, and scapularis region.      AROM   Overall AROM Comments Assessed seated. IR/er abducted    AROM Assessment Site Shoulder    Right/Left Shoulder Right;Left    Right Shoulder Flexion 145 Degrees    Right Shoulder ABduction 145 Degrees    Right Shoulder Internal Rotation 70 Degrees    Right Shoulder External Rotation 90 Degrees    Left Shoulder Flexion 153 Degrees    Left Shoulder ABduction 160 Degrees    Left Shoulder Internal  Rotation 85 Degrees    Left Shoulder External Rotation 90 Degrees      PROM   Overall PROM  Within functional limits for tasks performed    Overall PROM Comments BUE shoulders in all ranges.      Strength   Overall Strength Comments Assessed seatd. IR/er abducted.    Strength Assessment Site Shoulder    Right/Left Shoulder Left;Right    Right Shoulder Flexion 4+/5    Right Shoulder ABduction 5/5    Right Shoulder Internal Rotation 5/5    Right Shoulder External Rotation 5/5    Left Shoulder Flexion 5/5    Left Shoulder ABduction 5/5    Left Shoulder Internal Rotation 5/5    Left Shoulder External Rotation 5/5                             OT Education - 02/16/21 1717     Education Details shoulder stretches    Person(s) Educated Patient    Methods Explanation;Handout;Demonstration;Verbal cues    Comprehension Verbalized understanding;Returned demonstration              OT Short Term Goals - 02/16/21 1728       OT SHORT TERM GOAL #1   Title Patient will be educated and independent with HEP and any compensatory techniques that be helpful to faciliate her progress in therapy and experience increased comfort when attempting to sleep at night.    Time 4    Period Weeks    Status New    Target Date 03/16/21      OT SHORT TERM GOAL #2   Title Patient will report a decrease in pain in both shoulder when attempting to fall asleep of no greater than 4/10.    Time 4    Period Weeks    Status New      OT SHORT TERM GOAL #3   Title Patient will decrease her bilateral shoulder fascial restrictions to min amount or less in order to increase functional mobility needed to complete high level reaching tasks.    Time 4    Period Weeks    Status New                      Plan - 02/16/21 1725     Clinical Impression Statement A:  Patient is a 65 y/o female S/P bilateral RTC tendinitis causing increased pain; primarily when attempting to sleep and use  hera arms, increased fascial restrictions and slight decreased ROM. Discussed body positioning when trying to sleep sidelying with use of pillows.    OT Occupational Profile and History Problem Focused Assessment - Including review of records relating to presenting problem    Occupational performance deficits (Please refer to evaluation for details): Rest and Sleep;ADL's    Body Structure / Function / Physical Skills ADL;UE functional use;Pain;ROM;Fascial restriction    Rehab Potential Excellent    Clinical Decision Making Limited treatment options, no task modification necessary    Comorbidities Affecting Occupational Performance: May have comorbidities impacting occupational performance    Modification or Assistance to Complete Evaluation  No modification of tasks or assist necessary to complete eval    OT Frequency 1x / week    OT Duration 4 weeks    OT Treatment/Interventions Self-care/ADL training;Ultrasound;Patient/family education;Passive range of motion;Cryotherapy;Electrical Stimulation;Moist Heat;Neuromuscular education;Therapeutic activities;Manual Therapy;Therapeutic exercise    Plan P: Patient will benefit from skilled OT services to increase functional use of both shoulders while experiencing increased comfort and less pain especially when trying to fall asleep at night. Treatment plan: Myofascial release, manual stretching, A/ROM, scapular strengthening. Modalities PRN. Update HEP as needed.    OT Home Exercise Plan eval: shoulder stretches    Consulted and Agree with Plan of Care Patient             Patient will benefit from skilled therapeutic intervention in order to improve the following deficits and impairments:   Body Structure / Function / Physical Skills: ADL, UE functional use, Pain, ROM, Fascial restriction       Visit Diagnosis: Other symptoms and signs involving the musculoskeletal system - Plan: Ot plan of care cert/re-cert  Stiffness of left shoulder, not  elsewhere classified - Plan: Ot plan of care cert/re-cert  Stiffness of right shoulder, not elsewhere classified - Plan: Ot plan of care cert/re-cert  Acute pain of left shoulder - Plan: Ot plan of care cert/re-cert  Acute pain of right shoulder - Plan: Ot plan of care cert/re-cert    Problem List Patient Active Problem List   Diagnosis Date Noted   Acute non-recurrent pansinusitis 01/17/2021   Recurrent sinusitis 01/17/2021   Overweight with body mass index (BMI) of 27 to 27.9 in adult 11/04/2020   Irritable bowel syndrome with constipation 10/24/2020   Cyst of ovary, right 09/28/2020   Urinary frequency 09/10/2020   Pelvic pain 09/10/2020   Pelvic relaxation due to cystocele, midline 09/10/2020   Class 1 obesity due to excess calories in adult 07/20/2020   Fatigue 07/05/2020   At risk for osteoporosis 07/05/2020   Other hyperlipidemia 06/16/2020   Insulin resistance 06/16/2020   Vitamin D deficiency 06/16/2020   At risk for heart disease 06/16/2020   Encounter for screening fecal occult blood testing 05/17/2020   History of UTI 05/17/2020   Encounter for gynecological examination with Papanicolaou smear of cervix 05/17/2020   Osteopenia 08/25/2019   Gastroesophageal reflux disease 07/28/2019   Essential hypertension 07/28/2019   Anxiety 07/28/2019   Hypertriglyceridemia 07/28/2019   Sinus pressure 07/28/2019   History of ovarian cyst 08/13/2018   Screening for colorectal cancer 08/13/2018   Routine adult health maintenance 08/13/2018   Uterine tenderness 11/16/2017   Vaginal atrophy 11/16/2017   Vulvar irritation 11/16/2017   UTI (urinary tract infection) 11/13/2017   Encounter for screening colonoscopy 09/05/2016   LLQ  pain 08/19/2014   CERVICAL RADICULITIS 06/12/2007   SHOULDER PAIN 05/14/2007   NECK PAIN, ACUTE 05/14/2007   HIGH BLOOD PRESSURE 03/05/2007    Limmie Patricia, OTR/L,CBIS  [phone]   02/16/2021, 5:35 PM  [facility] [address] [place], [place], [address] Phone: [phone]   Fax:  [phone]  Name: JIAYI LENGACHER MRN: [id number] Date of Birth: December 26, 1955

## 2021-02-16 NOTE — Patient Instructions (Signed)
Complete the following exercises 2-3 times a day.  Doorway Stretch  Place each hand opposite each other on the doorway. (You can change where you feel the stretch by moving arms higher or lower.) Step through with one foot and bend front knee until a stretch is felt and hold. Step through with the opposite foot on the next rep. Hold for __20-30__ seconds. Repeat __2__times.     Scapular Retraction (Standing)   With arms at sides, pinch shoulder blades together. Repeat __10__ times per set. Do __1__ sets per session. Do __2__ sessions per day.    Wall Flexion  Slide your arm up the wall or door frame until a stretch is felt in your shoulder . Hold for 20-30 seconds. Complete 2 times     Shoulder Abduction Stretch  Stand side ways by a wall with affected up on wall. Gently step in toward wall to feel stretch. Hold for 20-30 seconds. Complete 2 times.

## 2021-02-17 ENCOUNTER — Ambulatory Visit (INDEPENDENT_AMBULATORY_CARE_PROVIDER_SITE_OTHER): Payer: 59 | Admitting: Otolaryngology

## 2021-02-17 ENCOUNTER — Encounter (INDEPENDENT_AMBULATORY_CARE_PROVIDER_SITE_OTHER): Payer: Self-pay | Admitting: Otolaryngology

## 2021-02-17 VITALS — Temp 97.2°F

## 2021-02-17 DIAGNOSIS — J342 Deviated nasal septum: Secondary | ICD-10-CM

## 2021-02-17 NOTE — Progress Notes (Signed)
HPI: Victoria Page is a 65 y.o. female who presents is referred by her PCP for evaluation of sinuses.  Apparently she had seen her medical practitioner for antibiotics because of a sinus infection and felt like she was getting too many sinus infections and referred her here for further evaluation.  She states that she only has 2 or 3 sinus infections a year.  When she has a sinus infection she develop develops drainage and being a little stopped up and achy.  Does not have fevers.  Does not really describe that much yellow-green discharge.  She uses Flonase intermittently. She is doing well today..  Past Medical History:  Diagnosis Date   Anxiety    Back pain    Biliary colic    Constipation    Gallstones    GERD (gastroesophageal reflux disease)    High cholesterol    High triglycerides    History of sinus problem    Hypertension    IBS (irritable bowel syndrome)    Joint pain    LLQ pain 08/19/2014   Plantar fasciitis    SOB (shortness of breath)    Past Surgical History:  Procedure Laterality Date   COLONOSCOPY N/A 09/06/2016   Procedure: COLONOSCOPY;  Surgeon: Malissa Hippo, MD;  [facility];  Service: Endoscopy;  Laterality: N/A;  8:30   FLEXIBLE SIGMOIDOSCOPY     Social History   Socioeconomic History   Marital status: Divorced    Spouse name: Not on file   Number of children: 4   Years of education: Not on file   Highest education level: Not on file  Occupational History   Occupation: sub teacher   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  Tobacco Use   Smoking status: Never   Smokeless tobacco: Never  Vaping Use   Vaping Use: Never used  Substance and Sexual Activity   Alcohol use: Yes    Comment: few drinks a year   Drug use: No   Sexual activity: Yes    Birth control/protection: Post-menopausal  Other Topics Concern   Not on file  Social History Narrative   Not on file   Social Determinants of Health   Financial Resource Strain: Low Risk     Difficulty of Paying Living Expenses: Not hard at all  Food Insecurity: No Food Insecurity   Worried About Programme researcher, broadcasting/film/video in the Last Year: Never true   Ran Out of Food in the Last Year: Never true  Transportation Needs: No Transportation Needs   Lack of Transportation (Medical): No   Lack of Transportation (Non-Medical): No  Physical Activity: Sufficiently Active   Days of Exercise per Week: 3 days   Minutes of Exercise per Session: 50 min  Stress: No Stress Concern Present   Feeling of Stress : Not at all  Social Connections: Moderately Integrated   Frequency of Communication with Friends and Family: More than three times a week   Frequency of Social Gatherings with Friends and Family: More than three times a week   Attends Religious Services: More than 4 times per year   Active Member of [employer] or Organizations: Yes   Attends Banker Meetings: Never   Marital Status: Divorced   Family History  Problem Relation Age of Onset   Lung cancer Mother    Anxiety disorder Mother    Alcoholism Mother    Heart attack Father    Sudden death Father    Diverticulitis Paternal Grandmother  Diverticulitis Brother        died from septic shock   COPD Brother    Breast cancer Daughter 33   Other Brother        1/2 brother-   Colon cancer Neg Hx    Allergies  Allergen Reactions   Shrimp [Shellfish Allergy] Nausea And Vomiting   Sulfa Antibiotics    Prior to Admission medications   Medication Sig Start Date End Date Taking? Authorizing Provider  ALPRAZolam Prudy Feeler) 0.5 MG tablet Take 0.5 mg by mouth daily as needed. 03/31/20   [provider]  benzonatate (TESSALON) 100 MG capsule Take 1 capsule (100 mg total) by mouth every 8 (eight) hours. 01/16/21   Moshe Cipro, NP  CALCIUM PO Take by mouth. 1 chewable daily    [provider]  co-enzyme Q-10 30 MG capsule Take 30 mg by mouth 3 (three) times daily.    [provider]   hydrochlorothiazide (HYDRODIURIL) 25 MG tablet Take 25 mg by mouth daily.    [provider]  ipratropium (ATROVENT) 0.03 % nasal spray Place 2 sprays into both nostrils 2 (two) times daily. 01/09/21   Bing Neighbors, FNP     Positive ROS: Otherwise negative  All other systems have been reviewed and were otherwise negative with the exception of those mentioned in the HPI and as above.  Physical Exam: Constitutional: Alert, well-appearing, no acute distress Ears: External ears without lesions or tenderness. Ear canals are clear bilaterally with intact, clear TMs.  Nasal: External nose without lesions. Septum is deviated posteriorly to the right and anteriorly to the left.  Mild rhinitis noted.  After decongesting the nose nasal endoscopy was performed.  On nasal endoscopy both middle meatus regions were clear with no significant edema.  No polyps.  No mucopurulent discharge.  The nasopharynx was clear.  No signs of infection..  Oral: Lips and gums without lesions. Tongue and palate mucosa without lesions. Posterior oropharynx clear.  No significant postnasal drainage in the oropharynx. Neck: No palpable adenopathy or masses Respiratory: Breathing comfortably  Skin: No facial/neck lesions or rash noted.  Procedures  Assessment: History of mild septal deviation as well as rhinitis. Presently no evidence of active infection.  Plan: Reviewed with patient concerning regular use of Flonase especially when she has congestion as this will help alleviate recurrent sinus infections.  Also discussed with her concerning using saline rinses. If she gets by with only using 2 or 3 rounds of antibiotics a year would not recommend further evaluation however if she is having 5 or more sinus infections a year discussed with her concerning further evaluation of the sinuses with a CT scan of the sinuses to see if surgical intervention would be warranted warranted.  She is not really interested in  any surgery. Again recommended regular use of Flonase when she has congestion and occasional use of antibiotics when she has yellow-green discharge. She will follow-up as needed.   Narda Bonds, MD   CC:

## 2021-02-25 ENCOUNTER — Other Ambulatory Visit: Payer: Self-pay

## 2021-02-25 ENCOUNTER — Encounter (HOSPITAL_COMMUNITY): Payer: Self-pay | Admitting: Specialist

## 2021-02-25 ENCOUNTER — Ambulatory Visit (HOSPITAL_COMMUNITY): Payer: 59 | Admitting: Specialist

## 2021-02-25 DIAGNOSIS — M25511 Pain in right shoulder: Secondary | ICD-10-CM

## 2021-02-25 DIAGNOSIS — R29898 Other symptoms and signs involving the musculoskeletal system: Secondary | ICD-10-CM | POA: Diagnosis not present

## 2021-02-25 DIAGNOSIS — M25611 Stiffness of right shoulder, not elsewhere classified: Secondary | ICD-10-CM

## 2021-02-25 NOTE — Therapy (Signed)
[place] [facility] [address] [place], [place], [address] Phone: [phone]   Fax:  [phone]  Occupational Therapy Treatment  Patient Details  Name: Victoria Page MRN: [id number] Date of Birth: [birth year] Referring Provider (OT): Thane Edu, MD   Encounter Date: 02/25/2021   OT End of Session - 02/25/21 1052     Visit Number 2    Number of Visits 4    Date for OT Re-Evaluation 03/16/21    Authorization Type [employer] medicare    Authorization Time Period visit limit 30 PT/OT/Chiro 0 used. No copay 10% co-insurance    Authorization - Visit Number 2    Authorization - Number of Visits 30    OT Start Time [phone]    OT Stop Time 1030    OT Time Calculation (min) 40 min    Activity Tolerance Patient tolerated treatment well    Behavior During Therapy WFL for tasks assessed/performed             Past Medical History:  Diagnosis Date   Anxiety    Back pain    Biliary colic    Constipation    Gallstones    GERD (gastroesophageal reflux disease)    High cholesterol    High triglycerides    History of sinus problem    Hypertension    IBS (irritable bowel syndrome)    Joint pain    LLQ pain 08/19/2014   Plantar fasciitis    SOB (shortness of breath)     Past Surgical History:  Procedure Laterality Date   COLONOSCOPY N/A 09/06/2016   Procedure: COLONOSCOPY;  Surgeon: Malissa Hippo, MD;  [facility];  Service: Endoscopy;  Laterality: N/A;  8:30   FLEXIBLE SIGMOIDOSCOPY      There were no vitals filed for this visit.   Subjective Assessment - 02/25/21 1049     Subjective  S:  It feels about the same.  I have been doing my exercises.  The left shoulder isnt bad its the right that bothers me.    Currently in Pain? Yes    Pain Score 1     Pain Location Shoulder    Pain Orientation Right    Pain Descriptors / Indicators Aching    Pain Type Acute pain    Pain Radiating Towards upper arm, popping  sensation    Pain Onset More than a month ago    Pain Frequency Intermittent    Aggravating Factors  use    Pain Relieving Factors rest                [facility] OT Assessment - 02/25/21 0001       Assessment   Medical Diagnosis bilateral RTC tendinitis    Referring Provider (OT) Thane Edu, MD      Precautions   Precautions None      Restrictions   Weight Bearing Restrictions No                      OT Treatments/Exercises (OP) - 02/25/21 0001       Exercises   Exercises Shoulder      Shoulder Exercises: Supine   Protraction PROM;AROM;10 reps    Horizontal ABduction PROM;AROM;10 reps    External Rotation PROM;AROM;10 reps    Internal Rotation PROM;AROM;10 reps    Flexion PROM;AROM;10 reps    ABduction PROM;AROM;10 reps      Shoulder Exercises: Seated   Elevation AROM;10 reps  Extension AROM;10 reps    Row AROM;10 reps      Shoulder Exercises: Therapy Ball   Flexion 10 reps    ABduction 10 reps    Right/Left 5 reps      Shoulder Exercises: ROM/Strengthening   UBE (Upper Arm Bike) 1.0 resistance 3' in reverse at 4 speed    Proximal Shoulder Strengthening, Supine 10 x with min cuing      Manual Therapy   Manual Therapy Myofascial release    Manual therapy comments manual therapy completed seperately from all other interventions this date of service.    Myofascial Release Myofascial release and manual stretching to right upper arm, scapular, and shoulder region to decrease pain and improve pain free mobility with less fascial restrictions                    OT Education - 02/25/21 1052     Education Details reviewed POC and HEP    Person(s) Educated Patient    Methods Explanation    Comprehension Verbalized understanding              OT Short Term Goals - 02/25/21 1103       OT SHORT TERM GOAL #1   Title Patient will be educated and independent with HEP and any compensatory techniques that be helpful to faciliate her  progress in therapy and experience increased comfort when attempting to sleep at night.    Time 4    Period Weeks    Status On-going    Target Date 03/16/21      OT SHORT TERM GOAL #2   Title Patient will report a decrease in pain in both shoulder when attempting to fall asleep of no greater than 4/10.    Time 4    Period Weeks    Status On-going      OT SHORT TERM GOAL #3   Title Patient will decrease her bilateral shoulder fascial restrictions to min amount or less in order to increase functional mobility needed to complete high level reaching tasks.    Time 4    Period Weeks    Status On-going                      Plan - 02/25/21 1053     Clinical Impression Statement A:  Patient with 3/10 pain in right shoulder, however is able to move shoulder through wfl a/rom and p/rom in supine.  focused on scapular stability and strengthening for improved ability to reach and lift overhead and out to the side this date.    Body Structure / Function / Physical Skills ADL;UE functional use;Pain;ROM;Fascial restriction    Plan P:  Attempt A/ROM in standing, add scapular theraband for improved scapular stability and proximal shoulder strength.             Patient will benefit from skilled therapeutic intervention in order to improve the following deficits and impairments:   Body Structure / Function / Physical Skills: ADL, UE functional use, Pain, ROM, Fascial restriction       Visit Diagnosis: Other symptoms and signs involving the musculoskeletal system  Stiffness of right shoulder, not elsewhere classified  Acute pain of right shoulder    Problem List Patient Active Problem List   Diagnosis Date Noted   Acute non-recurrent pansinusitis 01/17/2021   Recurrent sinusitis 01/17/2021   Overweight with body mass index (BMI) of 27 to 27.9 in adult 11/04/2020   Irritable bowel  syndrome with constipation 10/24/2020   Cyst of ovary, right 09/28/2020   Urinary frequency  09/10/2020   Pelvic pain 09/10/2020   Pelvic relaxation due to cystocele, midline 09/10/2020   Class 1 obesity due to excess calories in adult 07/20/2020   Fatigue 07/05/2020   At risk for osteoporosis 07/05/2020   Other hyperlipidemia 06/16/2020   Insulin resistance 06/16/2020   Vitamin D deficiency 06/16/2020   At risk for heart disease 06/16/2020   Encounter for screening fecal occult blood testing 05/17/2020   History of UTI 05/17/2020   Encounter for gynecological examination with Papanicolaou smear of cervix 05/17/2020   Osteopenia 08/25/2019   Gastroesophageal reflux disease 07/28/2019   Essential hypertension 07/28/2019   Anxiety 07/28/2019   Hypertriglyceridemia 07/28/2019   Sinus pressure 07/28/2019   History of ovarian cyst 08/13/2018   Screening for colorectal cancer 08/13/2018   Routine adult health maintenance 08/13/2018   Uterine tenderness 11/16/2017   Vaginal atrophy 11/16/2017   Vulvar irritation 11/16/2017   UTI (urinary tract infection) 11/13/2017   Encounter for screening colonoscopy 09/05/2016   LLQ pain 08/19/2014   CERVICAL RADICULITIS 06/12/2007   SHOULDER PAIN 05/14/2007   NECK PAIN, ACUTE 05/14/2007   HIGH BLOOD PRESSURE 03/05/2007    Shirlean Mylar, MHA, OTR/L [phone]  02/25/2021, 11:12 AM  [facility] [address] [place], [place], [address] Phone: [phone]   Fax:  [phone]  Name: BRILEIGH SEVCIK MRN: [id number] Date of Birth: 1956-05-15

## 2021-03-02 ENCOUNTER — Ambulatory Visit (INDEPENDENT_AMBULATORY_CARE_PROVIDER_SITE_OTHER): Payer: 59 | Admitting: Adult Health

## 2021-03-02 ENCOUNTER — Telehealth: Payer: Self-pay | Admitting: Gastroenterology

## 2021-03-02 ENCOUNTER — Other Ambulatory Visit: Payer: Self-pay

## 2021-03-02 ENCOUNTER — Encounter (HOSPITAL_COMMUNITY): Payer: Self-pay

## 2021-03-02 ENCOUNTER — Ambulatory Visit (HOSPITAL_COMMUNITY): Payer: 59

## 2021-03-02 ENCOUNTER — Encounter (INDEPENDENT_AMBULATORY_CARE_PROVIDER_SITE_OTHER): Payer: Self-pay | Admitting: Adult Health

## 2021-03-02 VITALS — BP 134/80 | HR 66 | Temp 97.4°F | Ht 65.0 in | Wt 165.0 lb

## 2021-03-02 DIAGNOSIS — I10 Essential (primary) hypertension: Secondary | ICD-10-CM

## 2021-03-02 DIAGNOSIS — M25612 Stiffness of left shoulder, not elsewhere classified: Secondary | ICD-10-CM

## 2021-03-02 DIAGNOSIS — Z6830 Body mass index (BMI) 30.0-30.9, adult: Secondary | ICD-10-CM

## 2021-03-02 DIAGNOSIS — R29898 Other symptoms and signs involving the musculoskeletal system: Secondary | ICD-10-CM

## 2021-03-02 DIAGNOSIS — M25511 Pain in right shoulder: Secondary | ICD-10-CM

## 2021-03-02 DIAGNOSIS — E6609 Other obesity due to excess calories: Secondary | ICD-10-CM

## 2021-03-02 DIAGNOSIS — F419 Anxiety disorder, unspecified: Secondary | ICD-10-CM

## 2021-03-02 DIAGNOSIS — K588 Other irritable bowel syndrome: Secondary | ICD-10-CM | POA: Diagnosis not present

## 2021-03-02 DIAGNOSIS — M25512 Pain in left shoulder: Secondary | ICD-10-CM

## 2021-03-02 DIAGNOSIS — M25611 Stiffness of right shoulder, not elsewhere classified: Secondary | ICD-10-CM

## 2021-03-02 NOTE — Patient Instructions (Signed)
Repeat all exercises 10-15 times, 1-2 times per day.    6) X to V arms (cheerleader move):  Begin with arms straight down, crossed in front of body in an "X". Keeping arms crossed, lift arms straight up overhead. Then spread arms apart into a "V" shape.  Bring back together into x and lower down to starting position.    7) W arms:  Begin with elbows bent and even with shoulders, hands pointing to ceiling. Keeping elbows at shoulder level, 1-shrug shoulders up, 2-squeeze shoulder blades together, and 3-relax.     

## 2021-03-02 NOTE — Therapy (Signed)
[place] [facility] [address] [place], [place], [address] Phone: [phone]   Fax:  [phone]  Occupational Therapy Treatment  Patient Details  Name: Victoria Page MRN: [id number] Date of Birth: [birth year] Referring Provider (OT): Thane Edu, MD   Encounter Date: 03/02/2021   OT End of Session - 03/02/21 1732     Visit Number 3    Number of Visits 4    Date for OT Re-Evaluation 03/16/21    Authorization Type [employer]    Authorization Time Period visit limit 30 PT/OT/Chiro 0 used. No copay 10% co-insurance    Authorization - Visit Number 3    Authorization - Number of Visits 30    OT Start Time 1645    OT Stop Time 1724    OT Time Calculation (min) 39 min    Activity Tolerance Patient tolerated treatment well    Behavior During Therapy WFL for tasks assessed/performed             Past Medical History:  Diagnosis Date   Anxiety    Back pain    Biliary colic    Constipation    Gallstones    GERD (gastroesophageal reflux disease)    High cholesterol    High triglycerides    History of sinus problem    Hypertension    IBS (irritable bowel syndrome)    Joint pain    LLQ pain 08/19/2014   Plantar fasciitis    SOB (shortness of breath)     Past Surgical History:  Procedure Laterality Date   COLONOSCOPY N/A 09/06/2016   Procedure: COLONOSCOPY;  Surgeon: Malissa Hippo, MD;  [facility];  Service: Endoscopy;  Laterality: N/A;  8:30   FLEXIBLE SIGMOIDOSCOPY      There were no vitals filed for this visit.   Subjective Assessment - 03/02/21 1700     Subjective  S: I wake up sometimes 10 times at night because they will hurt. it's a sharp pain.    Currently in Pain? Yes    Pain Score 4     Pain Location Shoulder    Pain Orientation Right    Pain Descriptors / Indicators Sore    Pain Type Acute pain    Pain Onset Today    Pain Frequency Occasional    Aggravating Factors  when lifting her  arm in the waiting room.    Pain Relieving Factors N/A    Effect of Pain on Daily Activities no effect    Multiple Pain Sites No                [facility] OT Assessment - 03/02/21 1702       Assessment   Medical Diagnosis bilateral RTC tendinitis      Precautions   Precautions None                      OT Treatments/Exercises (OP) - 03/02/21 1702       Exercises   Exercises Shoulder      Shoulder Exercises: Supine   Protraction PROM;5 reps    Horizontal ABduction PROM;5 reps    External Rotation PROM;5 reps    Internal Rotation PROM;5 reps    Flexion PROM;5 reps    ABduction PROM;5 reps      Shoulder Exercises: Standing   Protraction AROM;12 reps    Horizontal ABduction AROM;12 reps    External Rotation AROM;12 reps    Internal Rotation  AROM;12 reps    Flexion AROM;12 reps    ABduction AROM;12 reps    Extension Theraband;10 reps    Theraband Level (Shoulder Extension) Level 2 (Red)    Row Theraband;10 reps   palms up   Theraband Level (Shoulder Row) Level 2 (Red)    Retraction Theraband;10 reps    Theraband Level (Shoulder Retraction) Level 2 (Red)      Shoulder Exercises: ROM/Strengthening   UBE (Upper Arm Bike) 1.0 resistance 3' in reverse at 4 speed    "W" Arms A/ROM 5X    X to V Arms A/ROM 5X    Proximal Shoulder Strengthening, Seated 1' flexion 1' abduction both arms individually using washcloth at door.      Manual Therapy   Manual Therapy Myofascial release    Manual therapy comments manual therapy completed seperately from all other interventions this date of service.    Myofascial Release Myofascial release and manual stretching to right upper arm, scapular, and shoulder region to decrease pain and improve pain free mobility with less fascial restrictions                    OT Education - 03/02/21 1731     Education Details X to v arms and W arms added to HEP.    Person(s) Educated Patient    Methods  Explanation;Demonstration;Verbal cues;Handout    Comprehension Verbalized understanding;Returned demonstration              OT Short Term Goals - 02/25/21 1103       OT SHORT TERM GOAL #1   Title Patient will be educated and independent with HEP and any compensatory techniques that be helpful to faciliate her progress in therapy and experience increased comfort when attempting to sleep at night.    Time 4    Period Weeks    Status On-going    Target Date 03/16/21      OT SHORT TERM GOAL #2   Title Patient will report a decrease in pain in both shoulder when attempting to fall asleep of no greater than 4/10.    Time 4    Period Weeks    Status On-going      OT SHORT TERM GOAL #3   Title Patient will decrease her bilateral shoulder fascial restrictions to min amount or less in order to increase functional mobility needed to complete high level reaching tasks.    Time 4    Period Weeks    Status On-going                      Plan - 03/02/21 1733     Clinical Impression Statement A: Myofascial release completed right Upper arm and upper trapezius to address fascial restrictions. Completed standing A/ROM, proximal shoulder strengthening and added scapular strengthening with red band while providing verbal and tactile cues for form and technique. Some soreness and fatigue noted at end of session. Provided update to HEP at end of session.    Body Structure / Function / Physical Skills ADL;UE functional use;Pain;ROM;Fascial restriction    Plan P: Complete scapular strengthening with red band and provide for HEP. Focus on scapular strengthening and shoulder stability.    OT Home Exercise Plan eval: shoulder stretches 6/29: X to V arms and W arms    Consulted and Agree with Plan of Care Patient             Patient will benefit from skilled therapeutic intervention  in order to improve the following deficits and impairments:   Body Structure / Function / Physical  Skills: ADL, UE functional use, Pain, ROM, Fascial restriction       Visit Diagnosis: Other symptoms and signs involving the musculoskeletal system  Stiffness of right shoulder, not elsewhere classified  Acute pain of right shoulder  Stiffness of left shoulder, not elsewhere classified  Acute pain of left shoulder    Problem List Patient Active Problem List   Diagnosis Date Noted   Acute non-recurrent pansinusitis 01/17/2021   Recurrent sinusitis 01/17/2021   Overweight with body mass index (BMI) of 27 to 27.9 in adult 11/04/2020   Irritable bowel syndrome with constipation 10/24/2020   Cyst of ovary, right 09/28/2020   Urinary frequency 09/10/2020   Pelvic pain 09/10/2020   Pelvic relaxation due to cystocele, midline 09/10/2020   Class 1 obesity due to excess calories in adult 07/20/2020   Fatigue 07/05/2020   At risk for osteoporosis 07/05/2020   Other hyperlipidemia 06/16/2020   Insulin resistance 06/16/2020   Vitamin D deficiency 06/16/2020   At risk for heart disease 06/16/2020   Encounter for screening fecal occult blood testing 05/17/2020   History of UTI 05/17/2020   Encounter for gynecological examination with Papanicolaou smear of cervix 05/17/2020   Osteopenia 08/25/2019   Gastroesophageal reflux disease 07/28/2019   Essential hypertension 07/28/2019   Anxiety 07/28/2019   Hypertriglyceridemia 07/28/2019   Sinus pressure 07/28/2019   History of ovarian cyst 08/13/2018   Screening for colorectal cancer 08/13/2018   Routine adult health maintenance 08/13/2018   Uterine tenderness 11/16/2017   Vaginal atrophy 11/16/2017   Vulvar irritation 11/16/2017   UTI (urinary tract infection) 11/13/2017   Encounter for screening colonoscopy 09/05/2016   LLQ pain 08/19/2014   CERVICAL RADICULITIS 06/12/2007   SHOULDER PAIN 05/14/2007   NECK PAIN, ACUTE 05/14/2007   HIGH BLOOD PRESSURE 03/05/2007    Limmie Patricia, OTR/L,CBIS  [phone]   03/02/2021,  5:35 PM  [facility] [address] [place], [place], [address] Phone: [phone]   Fax:  [phone]  Name: Victoria Page MRN: [id number] Date of Birth: 1955-11-14

## 2021-03-02 NOTE — Telephone Encounter (Signed)
Patient called requesting refill on Linzess 

## 2021-03-03 MED ORDER — LINACLOTIDE 72 MCG PO CAPS: 72.0000 ug | ORAL_CAPSULE | Freq: Every day | ORAL | 2 refills | Status: DC

## 2021-03-03 NOTE — Telephone Encounter (Signed)
Refill of Linzess 72 mcg sent to pharmacy. Patient seen in clinic and samples were given on 4-26.

## 2021-03-08 NOTE — Progress Notes (Signed)
Chief Complaint:   OBESITY Victoria Page is here to discuss her progress with her obesity treatment plan along with follow-up of her obesity related diagnoses. Victoria Page is on the Category 2 Plan with breakfast options and states she is following her eating plan approximately 80% of the time. Victoria Page states she is walking 30 minutes 5-6 times per week.  Today's visit was #: 21 Starting weight: 175 lbs Starting date: 02/18/2020 Today's weight: 165 lbs Today's date: 03/02/2021 Total lbs lost to date: 10 Total lbs lost since last in-office visit: 0  Interim History: Victoria Page estimates to consume >80 oz of water/day.  Bioimpedance scale reflects 1 lb water increase. She reports worsening constipation. She is not taking daily Linzess 72 mcg as prescribed by GI.  Subjective:   1. Essential hypertension BP/HR stable at OV. Pt denies cardiac symptoms. She is on HCTZ 25 mg QD.  BP Readings from Last 3 Encounters:  03/02/21 134/80  02/09/21 (!) 140/99  02/08/21 (!) 151/85   2. Anxiety PDMP reviewed- refilling alprazolam 0.5 mg PRN every 30 days. Victoria Page has been using alprazolam nightly for anxiety and right shoulder pain.  3. Other irritable bowel syndrome She reports worsening constipation. She is not taking daily Linzess 72 mcg as prescribed by GI.  Assessment/Plan:   1. Essential hypertension Victoria Page is working on healthy weight loss and exercise to improve blood pressure control. We will watch for signs of hypotension as she continues her lifestyle modifications.  Continue daily HCTZ.  2. Anxiety Behavior modification techniques were discussed today to help Victoria Page deal with her anxiety.  Orders and follow up as documented in patient record.  Continue alprazolam as directed.  3. Other irritable bowel syndrome Take Linzess 72 mcg as directed. Continue excellent water intake. F/u with GI as needed.   4. Obesity with current BMI 27.5  Victoria Page is currently in the action  stage of change. As such, her goal is to continue with weight loss efforts. She has agreed to the Category 2 Plan.   Take daily Linzess 72 mcg QD.  Exercise goals:  As is  Behavioral modification strategies: increasing lean protein intake, decreasing simple carbohydrates, increasing vegetables, increasing high fiber foods, meal planning and cooking strategies, keeping healthy foods in the home, and planning for success.  Victoria Page has agreed to follow-up with our clinic in 4 weeks. She was informed of the importance of frequent follow-up visits to maximize her success with intensive lifestyle modifications for her multiple health conditions.   Objective:   Blood pressure 134/80, pulse 66, temperature (!) 97.4 F (36.3 C), height 5\' 5"  (1.651 m), weight 165 lb (74.8 kg), SpO2 98 %. Body mass index is 27.46 kg/m.  General: Cooperative, alert, well developed, in no acute distress. HEENT: Conjunctivae and lids unremarkable. Cardiovascular: Regular rhythm.  Lungs: Normal work of breathing. Neurologic: No focal deficits.   Lab Results  Component Value Date   CREATININE 0.64 11/23/2020   BUN 13 11/23/2020   NA 138 11/23/2020   K 4.7 11/23/2020   CL 98 11/23/2020   CO2 25 11/23/2020   Lab Results  Component Value Date   ALT 17 11/23/2020   AST 20 11/23/2020   ALKPHOS 103 11/23/2020   BILITOT 0.5 11/23/2020   Lab Results  Component Value Date   HGBA1C 5.4 11/23/2020   HGBA1C 5.3 02/18/2020   Lab Results  Component Value Date   INSULIN 4.8 11/23/2020   INSULIN 9.9 07/05/2020   INSULIN 11.4 02/18/2020  Lab Results  Component Value Date   TSH 1.830 02/18/2020   Lab Results  Component Value Date   CHOL 239 (H) 11/23/2020   HDL 60 11/23/2020   LDLCALC 153 (H) 11/23/2020   TRIG 148 11/23/2020   CHOLHDL 4.0 11/23/2020   Lab Results  Component Value Date   VD25OH 40.1 11/23/2020   VD25OH 40.4 09/07/2020   VD25OH 53.4 07/05/2020   Lab Results  Component Value Date    WBC 5.5 09/18/2019   HGB 15.4 09/18/2019   HCT 45.6 (H) 09/18/2019   MCV 89.8 09/18/2019   PLT 242 09/18/2019   No results found for: IRON, TIBC, FERRITIN   Attestation Statements:   Reviewed by clinician on day of visit: allergies, medications, problem list, medical history, surgical history, family history, social history, and previous encounter notes.  Time spent on visit including pre-visit chart review and post-visit care and charting was 28 minutes.   Edmund Hilda, CMA, am acting as transcriptionist for William Hamburger, NP.  I have reviewed the above documentation for accuracy and completeness, and I agree with the above. -  Jayleon Mcfarlane d. Dennis Killilea, NP-C

## 2021-03-09 ENCOUNTER — Encounter (HOSPITAL_COMMUNITY): Payer: Self-pay | Admitting: Occupational Therapy

## 2021-03-09 ENCOUNTER — Other Ambulatory Visit: Payer: Self-pay

## 2021-03-09 ENCOUNTER — Ambulatory Visit (HOSPITAL_COMMUNITY): Payer: 59 | Attending: Orthopedic Surgery | Admitting: Occupational Therapy

## 2021-03-09 DIAGNOSIS — M25611 Stiffness of right shoulder, not elsewhere classified: Secondary | ICD-10-CM | POA: Diagnosis present

## 2021-03-09 DIAGNOSIS — R29898 Other symptoms and signs involving the musculoskeletal system: Secondary | ICD-10-CM | POA: Insufficient documentation

## 2021-03-09 DIAGNOSIS — M25612 Stiffness of left shoulder, not elsewhere classified: Secondary | ICD-10-CM | POA: Insufficient documentation

## 2021-03-09 DIAGNOSIS — M25512 Pain in left shoulder: Secondary | ICD-10-CM

## 2021-03-09 DIAGNOSIS — M25511 Pain in right shoulder: Secondary | ICD-10-CM | POA: Insufficient documentation

## 2021-03-09 NOTE — Therapy (Signed)
[facility] [address] [place], [place], [address] Phone: [phone]   Fax:  [phone]  Occupational Therapy Treatment  Patient Details  Name: Victoria Page MRN: [id number] Date of Birth: 01-01-1956 Referring Provider (OT): Thane Edu, MD   Encounter Date: 03/09/2021   OT End of Session - 03/09/21 1524     Visit Number 4    Number of Visits 5    Date for OT Re-Evaluation 03/16/21    Authorization Type [employer]    Authorization Time Period visit limit 30 PT/OT/Chiro 0 used. No copay 10% co-insurance    Authorization - Visit Number 4    Authorization - Number of Visits 30    OT Start Time 1430   pt arrived late   OT Stop Time 1514    OT Time Calculation (min) 44 min    Activity Tolerance Patient tolerated treatment well    Behavior During Therapy WFL for tasks assessed/performed             Past Medical History:  Diagnosis Date   Anxiety    Back pain    Biliary colic    Constipation    Gallstones    GERD (gastroesophageal reflux disease)    High cholesterol    High triglycerides    History of sinus problem    Hypertension    IBS (irritable bowel syndrome)    Joint pain    LLQ pain 08/19/2014   Plantar fasciitis    SOB (shortness of breath)     Past Surgical History:  Procedure Laterality Date   COLONOSCOPY N/A 09/06/2016   Procedure: COLONOSCOPY;  Surgeon: Malissa Hippo, MD;  [facility];  Service: Endoscopy;  Laterality: N/A;  8:30   FLEXIBLE SIGMOIDOSCOPY      There were no vitals filed for this visit.   Subjective Assessment - 03/09/21 1355     Subjective  S: I'm taking tylenol PM and now I can sleep.    Currently in Pain? No/denies                [facility] OT Assessment - 03/09/21 1354       Assessment   Medical Diagnosis bilateral RTC tendinitis      Precautions   Precautions None                      OT Treatments/Exercises (OP) - 03/09/21  1355       Exercises   Exercises Shoulder      Shoulder Exercises: Supine   Protraction PROM;5 reps    Horizontal ABduction PROM;5 reps    External Rotation PROM;5 reps    Internal Rotation PROM;5 reps    Flexion PROM;5 reps    ABduction PROM;5 reps      Shoulder Exercises: Standing   Protraction AROM;12 reps    Horizontal ABduction AROM;12 reps    External Rotation AROM;12 reps    Internal Rotation AROM;12 reps    Flexion AROM;12 reps    ABduction AROM;12 reps    Extension Theraband;10 reps    Theraband Level (Shoulder Extension) Level 2 (Red)    Row Theraband;10 reps    Theraband Level (Shoulder Row) Level 2 (Red)    Retraction Theraband;10 reps    Theraband Level (Shoulder Retraction) Level 2 (Red)      Shoulder Exercises: ROM/Strengthening   X to V Arms 10X, A/ROM    Proximal Shoulder Strengthening, Seated 10X each, no  rest breaks    Ball on Wall 1' flexion 1' abduction, BUE, green ball      Manual Therapy   Manual Therapy Myofascial release    Manual therapy comments manual therapy completed seperately from all other interventions this date of service.    Myofascial Release Myofascial release and manual stretching to right upper arm, scapular, and shoulder region to decrease pain and improve pain free mobility with less fascial restrictions                    OT Education - 03/09/21 1408     Education Details scapular theraband (red)    Person(s) Educated Patient    Methods Explanation;Demonstration;Verbal cues;Handout    Comprehension Verbalized understanding;Returned demonstration              OT Short Term Goals - 02/25/21 1103       OT SHORT TERM GOAL #1   Title Patient will be educated and independent with HEP and any compensatory techniques that be helpful to faciliate her progress in therapy and experience increased comfort when attempting to sleep at night.    Time 4    Period Weeks    Status On-going    Target Date 03/16/21       OT SHORT TERM GOAL #2   Title Patient will report a decrease in pain in both shoulder when attempting to fall asleep of no greater than 4/10.    Time 4    Period Weeks    Status On-going      OT SHORT TERM GOAL #3   Title Patient will decrease her bilateral shoulder fascial restrictions to min amount or less in order to increase functional mobility needed to complete high level reaching tasks.    Time 4    Period Weeks    Status On-going                      Plan - 03/09/21 1409     Clinical Impression Statement A: Pt reports her HEP is going well, she is now able to sleep with Tylenol PM. Continued with myofascial release to RUE to address fascial restrictions. Continued with passive stretching and A/ROM in standing. Continued with scapular theraband and added to HEP. Added ball on wall for BUE. Verbal cuing for form and technique.    Body Structure / Function / Physical Skills ADL;UE functional use;Pain;ROM;Fascial restriction    Plan P: Reassessment    OT Home Exercise Plan eval: shoulder stretches 6/29: X to V arms and W arms; 7/6: red scapular theraband    Consulted and Agree with Plan of Care Patient             Patient will benefit from skilled therapeutic intervention in order to improve the following deficits and impairments:   Body Structure / Function / Physical Skills: ADL, UE functional use, Pain, ROM, Fascial restriction       Visit Diagnosis: Other symptoms and signs involving the musculoskeletal system  Stiffness of right shoulder, not elsewhere classified  Acute pain of right shoulder  Stiffness of left shoulder, not elsewhere classified  Acute pain of left shoulder    Problem List Patient Active Problem List   Diagnosis Date Noted   Acute non-recurrent pansinusitis 01/17/2021   Recurrent sinusitis 01/17/2021   Overweight with body mass index (BMI) of 27 to 27.9 in adult 11/04/2020   Irritable bowel syndrome with constipation  10/24/2020   Cyst of ovary,  right 09/28/2020   Urinary frequency 09/10/2020   Pelvic pain 09/10/2020   Pelvic relaxation due to cystocele, midline 09/10/2020   Class 1 obesity due to excess calories in adult 07/20/2020   Fatigue 07/05/2020   At risk for osteoporosis 07/05/2020   Other hyperlipidemia 06/16/2020   Insulin resistance 06/16/2020   Vitamin D deficiency 06/16/2020   At risk for heart disease 06/16/2020   Encounter for screening fecal occult blood testing 05/17/2020   History of UTI 05/17/2020   Encounter for gynecological examination with Papanicolaou smear of cervix 05/17/2020   Osteopenia 08/25/2019   Gastroesophageal reflux disease 07/28/2019   Essential hypertension 07/28/2019   Anxiety 07/28/2019   Hypertriglyceridemia 07/28/2019   Sinus pressure 07/28/2019   History of ovarian cyst 08/13/2018   Screening for colorectal cancer 08/13/2018   Routine adult health maintenance 08/13/2018   Uterine tenderness 11/16/2017   Vaginal atrophy 11/16/2017   Vulvar irritation 11/16/2017   UTI (urinary tract infection) 11/13/2017   Encounter for screening colonoscopy 09/05/2016   LLQ pain 08/19/2014   CERVICAL RADICULITIS 06/12/2007   SHOULDER PAIN 05/14/2007   NECK PAIN, ACUTE 05/14/2007   HIGH BLOOD PRESSURE 03/05/2007    Ezra Sites, OTR/L  [phone] 03/09/2021, 3:27 PM  [facility] [address] [place], [place], [address] Phone: [phone]   Fax:  [phone]  Name: APPOLONIA ACKERT MRN: [id number] Date of Birth: 05-17-1956

## 2021-03-09 NOTE — Patient Instructions (Signed)

## 2021-03-15 ENCOUNTER — Ambulatory Visit (INDEPENDENT_AMBULATORY_CARE_PROVIDER_SITE_OTHER): Payer: 59

## 2021-03-15 ENCOUNTER — Ambulatory Visit
Admission: EM | Admit: 2021-03-15 | Discharge: 2021-03-15 | Disposition: A | Discharge status: Home / Self Care | Payer: 59 | Attending: Family Medicine | Admitting: Family Medicine

## 2021-03-15 ENCOUNTER — Other Ambulatory Visit: Payer: Self-pay

## 2021-03-15 DIAGNOSIS — M79671 Pain in right foot: Secondary | ICD-10-CM

## 2021-03-15 DIAGNOSIS — S92511A Displaced fracture of proximal phalanx of right lesser toe(s), initial encounter for closed fracture: Secondary | ICD-10-CM | POA: Diagnosis not present

## 2021-03-15 NOTE — ED Triage Notes (Signed)
Pain to RT foot after Hitting it on a door this morning

## 2021-03-15 NOTE — Discharge Instructions (Addendum)
Take Ibuprofen 600 mg twice daily for pain and inflammation. Tylenol as needed for breath through pain. Follow-up with orthopedic office by calling their office in tomorrow to schedule an appointment.

## 2021-03-16 ENCOUNTER — Encounter (HOSPITAL_COMMUNITY): Payer: Self-pay

## 2021-03-16 ENCOUNTER — Ambulatory Visit (HOSPITAL_COMMUNITY): Payer: 59

## 2021-03-16 DIAGNOSIS — M25611 Stiffness of right shoulder, not elsewhere classified: Secondary | ICD-10-CM

## 2021-03-16 DIAGNOSIS — R29898 Other symptoms and signs involving the musculoskeletal system: Secondary | ICD-10-CM | POA: Diagnosis not present

## 2021-03-16 DIAGNOSIS — M25512 Pain in left shoulder: Secondary | ICD-10-CM

## 2021-03-16 DIAGNOSIS — M25511 Pain in right shoulder: Secondary | ICD-10-CM

## 2021-03-16 DIAGNOSIS — M25612 Stiffness of left shoulder, not elsewhere classified: Secondary | ICD-10-CM

## 2021-03-16 NOTE — Therapy (Signed)
[place], [place], [address] Phone: [phone]   Fax:  [phone]  Occupational Therapy Treatment Reassessment and discharge Patient Details  Name: FE OKUBO MRN: [id number] Date of Birth: 1956/03/06 Referring Provider (OT): Larena Glassman, MD   Encounter Date: 03/16/2021   OT End of Session - 03/16/21 1154     Visit Number 5    Number of Visits 5    Authorization Type [employer]    Authorization Time Period visit limit 30 PT/OT/Chiro 0 used. No copay 10% co-insurance    Authorization - Visit Number 5    Authorization - Number of Visits 30    OT Start Time 9935   reassess and discharge   OT Stop Time 1146    OT Time Calculation (min) 19 min    Activity Tolerance Patient tolerated treatment well    Behavior During Therapy WFL for tasks assessed/performed             Past Medical History:  Diagnosis Date   Anxiety    Back pain    Biliary colic    Constipation    Gallstones    GERD (gastroesophageal reflux disease)    High cholesterol    High triglycerides    History of sinus problem    Hypertension    IBS (irritable bowel syndrome)    Joint pain    LLQ pain 08/19/2014   Plantar fasciitis    SOB (shortness of breath)     Past Surgical History:  Procedure Laterality Date   COLONOSCOPY N/A 09/06/2016   Procedure: COLONOSCOPY;  Surgeon: Rogene Houston, MD;  [facility];  Service: Endoscopy;  Laterality: N/A;  8:30   FLEXIBLE SIGMOIDOSCOPY      There were no vitals filed for this visit.   Subjective Assessment - 03/16/21 1150     Subjective  S: I don't have any pain today. At night if I move my arms wrong or have them in the wrong position, i will have some pain.    Currently in Pain? No/denies                [facility] OT Assessment - 03/16/21 1133       Assessment   Medical Diagnosis bilateral RTC tendinitis    Referring Provider (OT) Larena Glassman, MD       Precautions   Precautions None      Observation/Other Assessments   Focus on Therapeutic Outcomes (FOTO)  66/100      ROM / Strength   AROM / PROM / Strength AROM      AROM   Overall AROM Comments Assessed seated. IR/er abducted    AROM Assessment Site Shoulder    Right/Left Shoulder Right    Right Shoulder Flexion 155 Degrees   previous; 145   Right Shoulder ABduction 155 Degrees   previous: 145   Left Shoulder Flexion 155 Degrees   previous: 153   Left Shoulder ABduction 163 Degrees   previous: 160                     OT Treatments/Exercises (OP) - 03/16/21 1151       Exercises   Exercises Shoulder      Shoulder Exercises: Standing   Extension Theraband;10 reps    Theraband Level (Shoulder Extension) Level 3 (Green)    Row Theraband;10 reps    Theraband Level (Shoulder Row) Level 3 (Green)  Retraction Theraband;10 reps    Theraband Level (Shoulder Retraction) Level 3 (Green)                      OT Short Term Goals - 03/16/21 1132       OT SHORT TERM GOAL #1   Title Patient will be educated and independent with HEP and any compensatory techniques that be helpful to faciliate her progress in therapy and experience increased comfort when attempting to sleep at night.    Time 4    Period Weeks    Status Achieved    Target Date 03/16/21      OT SHORT TERM GOAL #2   Title Patient will report a decrease in pain in both shoulder when attempting to fall asleep of no greater than 4/10.    Time 4    Period Weeks    Status Achieved      OT SHORT TERM GOAL #3   Title Patient will decrease her bilateral shoulder fascial restrictions to min amount or less in order to increase functional mobility needed to complete high level reaching tasks.    Time 4    Period Weeks    Status Achieved                      Plan - 03/16/21 1156     Clinical Impression Statement A: Reassessment completed this date. patient has met all therapy  goals. She is now experiencing a pain level of 3/10 when attempting to fall asleep where she reported a pain score of 10/10 in her right at evaluation. Fascial restrictions have decreased to min amount in bilateral shoulders. She is also demonstrating increased A/ROM bilaterally for flexion and abduction. HEP was reviewed and recommended continuing with scapular strengthening exercises while progressing to green band resistance. All education was completed and questions were answered. Pt is ready for discharge.    Body Structure / Function / Physical Skills ADL;UE functional use;Pain;ROM;Fascial restriction    Plan P: discharge from OT services with HEP.    OT Home Exercise Plan eval: shoulder stretches 6/29: X to V arms and W arms; 7/6: red scapular theraband 7/13: provided green band for scapular strengthening.    Consulted and Agree with Plan of Care Patient             Patient will benefit from skilled therapeutic intervention in order to improve the following deficits and impairments:   Body Structure / Function / Physical Skills: ADL, UE functional use, Pain, ROM, Fascial restriction       Visit Diagnosis: Stiffness of right shoulder, not elsewhere classified  Other symptoms and signs involving the musculoskeletal system  Stiffness of left shoulder, not elsewhere classified  Acute pain of right shoulder  Acute pain of left shoulder    Problem List Patient Active Problem List   Diagnosis Date Noted   Acute non-recurrent pansinusitis 01/17/2021   Recurrent sinusitis 01/17/2021   Overweight with body mass index (BMI) of 27 to 27.9 in adult 11/04/2020   Irritable bowel syndrome with constipation 10/24/2020   Cyst of ovary, right 09/28/2020   Urinary frequency 09/10/2020   Pelvic pain 09/10/2020   Pelvic relaxation due to cystocele, midline 09/10/2020   Class 1 obesity due to excess calories in adult 07/20/2020   Fatigue 07/05/2020   At risk for osteoporosis 07/05/2020    Other hyperlipidemia 06/16/2020   Insulin resistance 06/16/2020   Vitamin D deficiency 06/16/2020  At risk for heart disease 06/16/2020   Encounter for screening fecal occult blood testing 05/17/2020   History of UTI 05/17/2020   Encounter for gynecological examination with Papanicolaou smear of cervix 05/17/2020   Osteopenia 08/25/2019   Gastroesophageal reflux disease 07/28/2019   Essential hypertension 07/28/2019   Anxiety 07/28/2019   Hypertriglyceridemia 07/28/2019   Sinus pressure 07/28/2019   History of ovarian cyst 08/13/2018   Screening for colorectal cancer 08/13/2018   Routine adult health maintenance 08/13/2018   Uterine tenderness 11/16/2017   Vaginal atrophy 11/16/2017   Vulvar irritation 11/16/2017   UTI (urinary tract infection) 11/13/2017   Encounter for screening colonoscopy 09/05/2016   LLQ pain 08/19/2014   CERVICAL RADICULITIS 06/12/2007   SHOULDER PAIN 05/14/2007   NECK PAIN, ACUTE 05/14/2007   HIGH BLOOD PRESSURE 03/05/2007     OCCUPATIONAL THERAPY DISCHARGE SUMMARY  Visits from Start of Care: 5  Current functional level related to goals / functional outcomes: See above   Remaining deficits: See above   Education / Equipment: See above   Patient agrees to discharge. Patient goals were met. Patient is being discharged due to meeting the stated rehab goals.Ailene Ravel, OTR/L,CBIS  [phone]   03/16/2021, 12:00 PM  [place], [place], [address] Phone: [phone]   Fax:  [phone]  Name: DEBRINA KIZER MRN: [id number] Date of Birth: [birth year]

## 2021-03-17 NOTE — ED Provider Notes (Signed)
[facility]    CSN: [id number] Arrival date & time: 03/15/21  1859      History   Chief Complaint Chief Complaint  Patient presents with   Foot Pain    HPI Victoria Page is a 65 y.o. female.   HPI Patient injured right foot after accidentally slamming her right foot into a door today. Pain, swelling, and bruising 4th and 5th toe. Pain with weightbearing. Taken OTC for acute pain.  Past Medical History:  Diagnosis Date   Anxiety    Back pain    Biliary colic    Constipation    Gallstones    GERD (gastroesophageal reflux disease)    High cholesterol    High triglycerides    History of sinus problem    Hypertension    IBS (irritable bowel syndrome)    Joint pain    LLQ pain 08/19/2014   Plantar fasciitis    SOB (shortness of breath)     Patient Active Problem List   Diagnosis Date Noted   Acute non-recurrent pansinusitis 01/17/2021   Recurrent sinusitis 01/17/2021   Overweight with body mass index (BMI) of 27 to 27.9 in adult 11/04/2020   Irritable bowel syndrome with constipation 10/24/2020   Cyst of ovary, right 09/28/2020   Urinary frequency 09/10/2020   Pelvic pain 09/10/2020   Pelvic relaxation due to cystocele, midline 09/10/2020   Class 1 obesity due to excess calories in adult 07/20/2020   Fatigue 07/05/2020   At risk for osteoporosis 07/05/2020   Other hyperlipidemia 06/16/2020   Insulin resistance 06/16/2020   Vitamin D deficiency 06/16/2020   At risk for heart disease 06/16/2020   Encounter for screening fecal occult blood testing 05/17/2020   History of UTI 05/17/2020   Encounter for gynecological examination with Papanicolaou smear of cervix 05/17/2020   Osteopenia 08/25/2019   Gastroesophageal reflux disease 07/28/2019   Essential hypertension 07/28/2019   Anxiety 07/28/2019   Hypertriglyceridemia 07/28/2019   Sinus pressure 07/28/2019   History of ovarian cyst 08/13/2018   Screening for colorectal cancer 08/13/2018    Routine adult health maintenance 08/13/2018   Uterine tenderness 11/16/2017   Vaginal atrophy 11/16/2017   Vulvar irritation 11/16/2017   UTI (urinary tract infection) 11/13/2017   Encounter for screening colonoscopy 09/05/2016   LLQ pain 08/19/2014   CERVICAL RADICULITIS 06/12/2007   SHOULDER PAIN 05/14/2007   NECK PAIN, ACUTE 05/14/2007   HIGH BLOOD PRESSURE 03/05/2007    Past Surgical History:  Procedure Laterality Date   COLONOSCOPY N/A 09/06/2016   Procedure: COLONOSCOPY;  Surgeon: Malissa Hippo, MD;  Location: [facility];  Service: Endoscopy;  Laterality: N/A;  8:30   FLEXIBLE SIGMOIDOSCOPY      OB History     Gravida  5   Para  3   Term      Preterm      AB  1   Living  3      SAB  1   IAB      Ectopic      Multiple      Live Births               Home Medications    Prior to Admission medications   Medication Sig Start Date End Date Taking? Authorizing Provider  ALPRAZolam Prudy Feeler) 0.5 MG tablet Take 0.5 mg by mouth daily as needed. 03/31/20   [provider]  CALCIUM PO Take by mouth. 1 chewable daily    [provider]  co-enzyme Q-10 30 MG capsule Take 30 mg by mouth 3 (three) times daily.    [provider]  hydrochlorothiazide (HYDRODIURIL) 25 MG tablet Take 25 mg by mouth daily.    [provider]  linaclotide Karlene Einstein) 72 MCG capsule Take 1 capsule (72 mcg total) by mouth daily before breakfast. 03/03/21   Armbruster, Willaim Rayas, MD    Family History Family History  Problem Relation Age of Onset   Lung cancer Mother    Anxiety disorder Mother    Alcoholism Mother    Heart attack Father    Sudden death Father    Diverticulitis Paternal Grandmother    Diverticulitis Brother        died from septic shock   COPD Brother    Breast cancer Daughter 10   Other Brother        1/2 brother-   Colon cancer Neg Hx     Social History Social History   Tobacco Use   Smoking status: Never   Smokeless  tobacco: Never  Vaping Use   Vaping Use: Never used  Substance Use Topics   Alcohol use: Yes    Comment: few drinks a year   Drug use: No     Allergies   Shrimp [shellfish allergy] and Sulfa antibiotics   Review of Systems Review of Systems Pertinent negatives listed in HPI  Physical Exam Triage Vital Signs ED Triage Vitals [03/15/21 1953]  Enc Vitals Group     BP      Pulse      Resp      Temp      Temp src      SpO2      Weight      Height      Head Circumference      Peak Flow      Pain Score 5     Pain Loc      Pain Edu?      Excl. in GC?    No data found.  Updated Vital Signs There were no vitals taken for this visit.  Visual Acuity Right Eye Distance:   Left Eye Distance:   Bilateral Distance:    Right Eye Near:   Left Eye Near:    Bilateral Near:     Physical Exam General appearance: Alert, cooperative, no distress Head: Normocephalic, without obvious abnormality, atraumatic Respiratory: Respirations even and unlabored, normal respiratory rate Heart: Rate and rhythm normal.  Extremities: tenderness and ecchymosis proximal region of of 4th and 5th right Skin: Skin color, texture, turgor normal. No rashes seen  Psych: Appropriate mood and affect.   UC Treatments / Results  Labs (all labs ordered are listed, but only abnormal results are displayed) Labs Reviewed - No data to display  EKG   Radiology DG Foot Complete Right  Result Date: 03/15/2021 CLINICAL DATA:  Blunt trauma to the right foot, initial encounter EXAM: RIGHT FOOT COMPLETE - 3+ VIEW COMPARISON:  None. FINDINGS: Mild hallux valgus deformity is noted. There is a minimally displaced fracture at the base of the fifth proximal phalanx. No other fractures are seen. Calcaneal spurring is seen. No soft tissue abnormality is noted. IMPRESSION: Fracture at the base of the fifth proximal phalanx. Electronically Signed   By: Alcide Clever M.D.   On: 03/15/2021 20:08     Procedures Procedures (including critical care time)  Medications Ordered in UC Medications - No data to display  Initial Impression / Assessment and Plan /  UC Course  I have reviewed the triage vital signs and the nursing notes.  Pertinent labs & imaging results that were available during my care of the patient were reviewed by me and considered in my medical decision making (see chart for details).    Closed mildly displaced 5th toe. Post op shoe applied. Ibuprofen 600 mg twice daily for 7 days Continue ice and elevation. Follow-up with orthopedics. Final Clinical Impressions(s) / UC Diagnoses   Final diagnoses:  Closed displaced fracture of proximal phalanx of lesser toe of right foot, initial encounter     Discharge Instructions      Take Ibuprofen 600 mg twice daily for pain and inflammation. Tylenol as needed for breath through pain. Follow-up with orthopedic office by calling their office in tomorrow to schedule an appointment.   ED Prescriptions   None    PDMP not reviewed this encounter.   Bing Neighbors, FNP 03/17/21 [phone]

## 2021-03-30 ENCOUNTER — Other Ambulatory Visit: Payer: Self-pay

## 2021-03-30 ENCOUNTER — Encounter (INDEPENDENT_AMBULATORY_CARE_PROVIDER_SITE_OTHER): Payer: Self-pay | Admitting: Adult Health

## 2021-03-30 ENCOUNTER — Ambulatory Visit (INDEPENDENT_AMBULATORY_CARE_PROVIDER_SITE_OTHER): Payer: 59 | Admitting: Adult Health

## 2021-03-30 VITALS — BP 128/80 | HR 66 | Temp 98.2°F | Ht 65.0 in | Wt 164.0 lb

## 2021-03-30 DIAGNOSIS — I10 Essential (primary) hypertension: Secondary | ICD-10-CM | POA: Diagnosis not present

## 2021-03-30 DIAGNOSIS — E6609 Other obesity due to excess calories: Secondary | ICD-10-CM

## 2021-03-30 DIAGNOSIS — F419 Anxiety disorder, unspecified: Secondary | ICD-10-CM | POA: Diagnosis not present

## 2021-03-30 DIAGNOSIS — K588 Other irritable bowel syndrome: Secondary | ICD-10-CM | POA: Diagnosis not present

## 2021-03-30 DIAGNOSIS — Z6830 Body mass index (BMI) 30.0-30.9, adult: Secondary | ICD-10-CM

## 2021-03-30 DIAGNOSIS — E66811 Obesity, class 1: Secondary | ICD-10-CM

## 2021-04-04 NOTE — Progress Notes (Signed)
Chief Complaint:   OBESITY Victoria Page is here to discuss her progress with her obesity treatment plan along with follow-up of her obesity related diagnoses. Victoria Page is on the Category 2 Plan and states she is following her eating plan approximately 70% of the time. Victoria Page states she is not exercising at this time due to having a broken toe.  Today's visit was #: 22 Starting weight: 175 lbs Starting date: 02/18/2020 Today's weight: 164 lbs Today's date: 03/30/2021 Total lbs lost to date: 11 lb Total lbs lost since last in-office visit: 1 lb  Interim History: Victoria Page suffered a closed displaced fracture of the proximal phalanx of her 5th right toe.   Treated at ED, on 03/15/2021, provided a post-op shoe.   She has been taking OTC NSAIDs, icing, and elevating. She did not follow-up with Orthopedics.  She has not needed a boot this last week.  Plan:  Check fasting labs at next visit.  Subjective:   1. Essential hypertension Blood pressure and heart rate are stable at office visit today.  She is on HCTZ 25 mg daily.  BP Readings from Last 3 Encounters:  03/30/21 128/80  03/02/21 134/80  02/09/21 (!) 140/99   2. Other irritable bowel syndrome She bought desipramine 25 mg nightly to decrease abdominal pain at night.  She ha been off it for over 2 months.  She was unsure of whey she was taking this prescription.  3. Anxiety PDMP reviewed - alprazolam 0.5 mg 30 tablets refilled in 30 day patterns.   She estimates to use alprazolam 0.25 - 0.5 mg 6-7 days per week.  Assessment/Plan:   1. Essential hypertension Victoria Page is working on healthy weight loss and exercise to improve blood pressure control. We will watch for signs of hypotension as she continues her lifestyle modifications. Continue HCTZ 25 mg daily. Continue Category 2 meal plan.  2. Other irritable bowel syndrome Avoid known food triggers.  Follow-up with GI as directed.  Ask GI if you should continue desipramine.     3. Anxiety Increase walking when able (fractured right 5th toe).  4. Obesity with current BMI 27.3  Victoria Page is currently in the action stage of change. As such, her goal is to continue with weight loss efforts. She has agreed to the Category 2 Plan.   Use My Fitness Pal to obtain nutrition information of foods off plan.  Exercise goals:  Activity limited due to recent fracture of right 5th toe.  Unable to wear tennis shoes currently.  Behavioral modification strategies: increasing lean protein intake, decreasing simple carbohydrates, meal planning and cooking strategies, keeping healthy foods in the home, and planning for success.  Victoria Page has agreed to follow-up with our clinic in 3-4 weeks. She was informed of the importance of frequent follow-up visits to maximize her success with intensive lifestyle modifications for her multiple health conditions.   Objective:   Blood pressure 128/80, pulse 66, temperature 98.2 F (36.8 C), height 5\' 5"  (1.651 m), weight 164 lb (74.4 kg), SpO2 99 %. Body mass index is 27.29 kg/m.  General: Cooperative, alert, well developed, in no acute distress. HEENT: Conjunctivae and lids unremarkable. Cardiovascular: Regular rhythm.  Lungs: Normal work of breathing. Neurologic: No focal deficits.   Lab Results  Component Value Date   CREATININE 0.64 11/23/2020   BUN 13 11/23/2020   NA 138 11/23/2020   K 4.7 11/23/2020   CL 98 11/23/2020   CO2 25 11/23/2020   Lab Results  Component Value Date  ALT 17 11/23/2020   AST 20 11/23/2020   ALKPHOS 103 11/23/2020   BILITOT 0.5 11/23/2020   Lab Results  Component Value Date   HGBA1C 5.4 11/23/2020   HGBA1C 5.3 02/18/2020   Lab Results  Component Value Date   INSULIN 4.8 11/23/2020   INSULIN 9.9 07/05/2020   INSULIN 11.4 02/18/2020   Lab Results  Component Value Date   TSH 1.830 02/18/2020   Lab Results  Component Value Date   CHOL 239 (H) 11/23/2020   HDL 60 11/23/2020   LDLCALC  153 (H) 11/23/2020   TRIG 148 11/23/2020   CHOLHDL 4.0 11/23/2020   Lab Results  Component Value Date   VD25OH 40.1 11/23/2020   VD25OH 40.4 09/07/2020   VD25OH 53.4 07/05/2020   Lab Results  Component Value Date   WBC 5.5 09/18/2019   HGB 15.4 09/18/2019   HCT 45.6 (H) 09/18/2019   MCV 89.8 09/18/2019   PLT 242 09/18/2019   Attestation Statements:   Reviewed by clinician on day of visit: allergies, medications, problem list, medical history, surgical history, family history, social history, and previous encounter notes.  Time spent on visit including pre-visit chart review and post-visit care and charting was 30 minutes.   I, Insurance claims handler, CMA, am acting as Energy manager for William Hamburger, NP.  I have reviewed the above documentation for accuracy and completeness, and I agree with the above. -  Dwan Fennel d. Sreenidhi Ganson, NP-C

## 2021-04-25 ENCOUNTER — Encounter (INDEPENDENT_AMBULATORY_CARE_PROVIDER_SITE_OTHER): Payer: Self-pay | Admitting: Adult Health

## 2021-04-25 ENCOUNTER — Other Ambulatory Visit: Payer: Self-pay

## 2021-04-25 ENCOUNTER — Ambulatory Visit (INDEPENDENT_AMBULATORY_CARE_PROVIDER_SITE_OTHER): Payer: 59 | Admitting: Adult Health

## 2021-04-25 VITALS — BP 108/69 | HR 63 | Temp 97.8°F | Ht 65.0 in | Wt 164.0 lb

## 2021-04-25 DIAGNOSIS — E663 Overweight: Secondary | ICD-10-CM

## 2021-04-25 DIAGNOSIS — Z6827 Body mass index (BMI) 27.0-27.9, adult: Secondary | ICD-10-CM | POA: Diagnosis not present

## 2021-04-25 DIAGNOSIS — F419 Anxiety disorder, unspecified: Secondary | ICD-10-CM | POA: Diagnosis not present

## 2021-04-25 DIAGNOSIS — I10 Essential (primary) hypertension: Secondary | ICD-10-CM

## 2021-04-26 NOTE — Progress Notes (Signed)
Chief Complaint:   OBESITY Annis is here to discuss her progress with her obesity treatment plan along with follow-up of her obesity related diagnoses. Lindie is on the Category 2 Plan and states she is following her eating plan approximately 50% of the time. [name] states she is walking 30 minutes 3 times per week.  Today's visit was #: 23 Starting weight: 175 lbs Starting date: 02/18/2020 Today's weight: 164 lbs Today's date: 04/25/2021 Total lbs lost to date: 11 Total lbs lost since last in-office visit: 0  Interim History: [name] has been using home food delivery service, [employer], 2 nights a week. She will experience occasional polyphagia in last evening. Reviewed Body Composition Information with pt.  Subjective:   1. Essential hypertension BP/HR excellent at OV.  [name] denies acute cardiac symptoms. She is on HCTZ 25 mg QD.  2. Anxiety Symptoms well controlled. Lutisha is on alprazolam 0.5 mg QD PRN anxieties. PDMP reviewed- no aberrancies noted.  Assessment/Plan:   1. Essential hypertension [name] is working on healthy weight loss and exercise to improve blood pressure control. We will watch for signs of hypotension as she continues her lifestyle modifications. Continue daily HCTZ.  2. Anxiety Behavior modification techniques were discussed today to help [name] deal with her anxiety.  Orders and follow up as documented in patient record. Increase regular walking.  3. Obesity with BMI 27.4  [name] is currently in the action stage of change. As such, her goal is to continue with weight loss efforts. She has agreed to the Category 2 Plan.   Increase protein at each meal. Follow category 2 at least 80% of the time. Increase walking to 30 minutes from 3 to 5 times a week. Check fasting labs at next OV.  Exercise goals:  As is- Increase walking to 30 minutes 5 times a week.  Behavioral modification strategies: increasing lean protein  intake, decreasing simple carbohydrates, meal planning and cooking strategies, keeping healthy foods in the home, and planning for success.  Mirela has agreed to follow-up with our clinic in 2-3 weeks. She was informed of the importance of frequent follow-up visits to maximize her success with intensive lifestyle modifications for her multiple health conditions.   Objective:   Blood pressure 108/69, pulse 63, temperature 97.8 F (36.6 C), height 5\' 5"  (1.651 m), weight 164 lb (74.4 kg), SpO2 99 %. Body mass index is 27.29 kg/m.  General: Cooperative, alert, well developed, in no acute distress. HEENT: Conjunctivae and lids unremarkable. Cardiovascular: Regular rhythm.  Lungs: Normal work of breathing. Neurologic: No focal deficits.   Lab Results  Component Value Date   CREATININE 0.64 11/23/2020   BUN 13 11/23/2020   NA 138 11/23/2020   K 4.7 11/23/2020   CL 98 11/23/2020   CO2 25 11/23/2020   Lab Results  Component Value Date   ALT 17 11/23/2020   AST 20 11/23/2020   ALKPHOS 103 11/23/2020   BILITOT 0.5 11/23/2020   Lab Results  Component Value Date   HGBA1C 5.4 11/23/2020   HGBA1C 5.3 02/18/2020   Lab Results  Component Value Date   INSULIN 4.8 11/23/2020   INSULIN 9.9 07/05/2020   INSULIN 11.4 02/18/2020   Lab Results  Component Value Date   TSH 1.830 02/18/2020   Lab Results  Component Value Date   CHOL 239 (H) 11/23/2020   HDL 60 11/23/2020   LDLCALC 153 (H) 11/23/2020   TRIG 148 11/23/2020   CHOLHDL 4.0 11/23/2020   Lab  Results  Component Value Date   VD25OH 40.1 11/23/2020   VD25OH 40.4 09/07/2020   VD25OH 53.4 07/05/2020   Lab Results  Component Value Date   WBC 5.5 09/18/2019   HGB 15.4 09/18/2019   HCT 45.6 (H) 09/18/2019   MCV 89.8 09/18/2019   PLT 242 09/18/2019    Attestation Statements:   Reviewed by clinician on day of visit: allergies, medications, problem list, medical history, surgical history, family history, social  history, and previous encounter notes.  Time spent on visit including pre-visit chart review and post-visit care and charting was 30 minutes.   Edmund Hilda, CMA, am acting as transcriptionist for William Hamburger, NP.  I have reviewed the above documentation for accuracy and completeness, and I agree with the above. -  Rosette Bellavance d. Susana Duell, NP-C

## 2021-05-19 ENCOUNTER — Ambulatory Visit (INDEPENDENT_AMBULATORY_CARE_PROVIDER_SITE_OTHER): Payer: 59 | Admitting: Adult Health

## 2021-05-19 ENCOUNTER — Encounter (INDEPENDENT_AMBULATORY_CARE_PROVIDER_SITE_OTHER): Payer: Self-pay | Admitting: Adult Health

## 2021-05-19 ENCOUNTER — Other Ambulatory Visit: Payer: Self-pay

## 2021-05-19 VITALS — BP 117/80 | HR 68 | Temp 97.7°F | Ht 65.0 in | Wt 164.0 lb

## 2021-05-19 DIAGNOSIS — I10 Essential (primary) hypertension: Secondary | ICD-10-CM | POA: Diagnosis not present

## 2021-05-19 DIAGNOSIS — E8881 Metabolic syndrome: Secondary | ICD-10-CM | POA: Diagnosis not present

## 2021-05-19 DIAGNOSIS — E559 Vitamin D deficiency, unspecified: Secondary | ICD-10-CM

## 2021-05-19 DIAGNOSIS — E88819 Insulin resistance, unspecified: Secondary | ICD-10-CM

## 2021-05-19 DIAGNOSIS — Z9189 Other specified personal risk factors, not elsewhere classified: Secondary | ICD-10-CM | POA: Diagnosis not present

## 2021-05-19 DIAGNOSIS — E782 Mixed hyperlipidemia: Secondary | ICD-10-CM | POA: Diagnosis not present

## 2021-05-19 DIAGNOSIS — E6609 Other obesity due to excess calories: Secondary | ICD-10-CM

## 2021-05-19 DIAGNOSIS — Z6830 Body mass index (BMI) 30.0-30.9, adult: Secondary | ICD-10-CM

## 2021-05-19 NOTE — Progress Notes (Signed)
Chief Complaint:   OBESITY Victoria Page is here to discuss her progress with her obesity treatment plan along with follow-up of her obesity related diagnoses. Victoria Page is on the Category 2 Plan and states she is following her eating plan approximately 80% of the time. Victoria Page states she is walking for 30-45 minutes 6 times per week.  Today's visit was #: 24 Starting weight: 175 lbs Starting date: 02/18/2020 Today's weight: 164 lbs Today's date: 05/19/2021 Total lbs lost to date: 11 lbs Total lbs lost since last in-office visit: 0  Interim History: Victoria Page is pleased to have maintained her weight.  She has increased her walking duration and really enjoyed it!  Of note:  Her father passed at age 70 from MI. Her mother passed at age 42 - lung cancer - heavy smoker.  Subjective:   1. Mixed hyperlipidemia She is not on statin therapy. Her father passed away at age 24 from MI. She denies tobacco/vape use.  Lab Results  Component Value Date   ALT 17 11/23/2020   AST 20 11/23/2020   ALKPHOS 103 11/23/2020   BILITOT 0.5 11/23/2020   Lab Results  Component Value Date   CHOL 239 (H) 11/23/2020   HDL 60 11/23/2020   LDLCALC 153 (H) 11/23/2020   TRIG 148 11/23/2020   CHOLHDL 4.0 11/23/2020   2. Essential hypertension She is on HCTZ 25 mg daily.  BP Readings from Last 3 Encounters:  05/19/21 117/80  04/25/21 108/69  03/30/21 128/80   3. Vitamin D deficiency She is on an OTC multivitamin.  Lab Results  Component Value Date   VD25OH 40.1 11/23/2020   VD25OH 40.4 09/07/2020   VD25OH 53.4 07/05/2020   4. Insulin resistance She is not any any BG lowering medications. She denies first degree family hx of T2D.  Lab Results  Component Value Date   INSULIN 4.8 11/23/2020   INSULIN 9.9 07/05/2020   INSULIN 11.4 02/18/2020   Lab Results  Component Value Date   HGBA1C 5.4 11/23/2020   5. At risk for heart disease Victoria Page is at a higher than average risk for  cardiovascular disease due to hyperlipidemia, hypertension, and obesity.    Assessment/Plan:   1. Mixed hyperlipidemia Will check labs today.  - Lipid panel  2. Essential hypertension Check labs today.  - Comprehensive metabolic panel  3. Vitamin D deficiency Will check vitamin D level today.  - VITAMIN D 25 Hydroxy (Vit-D Deficiency, Fractures)  4. Insulin resistance Check labs today.  - Hemoglobin A1c - Insulin, random  5. At risk for heart disease Victoria Page was given approximately 15 minutes of coronary artery disease prevention counseling today. She is 65 y.o. female and has risk factors for heart disease including obesity. We discussed intensive lifestyle modifications today with an emphasis on specific weight loss instructions and strategies.   Repetitive spaced learning was employed today to elicit superior memory formation and behavioral change.   6. Obesity with current BMI 27.3  Victoria Page is currently in the action stage of change. As such, her goal is to continue with weight loss efforts. She has agreed to the Category 2 Plan.   Exercise goals:  As is.  Behavioral modification strategies: increasing lean protein intake, decreasing simple carbohydrates, meal planning and cooking strategies, keeping healthy foods in the home, and planning for success.  Victoria Page has agreed to follow-up with our clinic in 3-4 weeks. She was informed of the importance of frequent follow-up visits to maximize her success with intensive  lifestyle modifications for her multiple health conditions.   Victoria Page was informed we would discuss her lab results at her next visit unless there is a critical issue that needs to be addressed sooner. Victoria Page agreed to keep her next visit at the agreed upon time to discuss these results.  Objective:   Blood pressure 117/80, pulse 68, temperature 97.7 F (36.5 C), height 5\' 5"  (1.651 m), weight 164 lb (74.4 kg), SpO2 99 %. Body mass index is 27.29  kg/m.  General: Cooperative, alert, well developed, in no acute distress. HEENT: Conjunctivae and lids unremarkable. Cardiovascular: Regular rhythm.  Lungs: Normal work of breathing. Neurologic: No focal deficits.   Lab Results  Component Value Date   CREATININE 0.64 11/23/2020   BUN 13 11/23/2020   NA 138 11/23/2020   K 4.7 11/23/2020   CL 98 11/23/2020   CO2 25 11/23/2020   Lab Results  Component Value Date   ALT 17 11/23/2020   AST 20 11/23/2020   ALKPHOS 103 11/23/2020   BILITOT 0.5 11/23/2020   Lab Results  Component Value Date   HGBA1C 5.4 11/23/2020   HGBA1C 5.3 02/18/2020   Lab Results  Component Value Date   INSULIN 4.8 11/23/2020   INSULIN 9.9 07/05/2020   INSULIN 11.4 02/18/2020   Lab Results  Component Value Date   TSH 1.830 02/18/2020   Lab Results  Component Value Date   CHOL 239 (H) 11/23/2020   HDL 60 11/23/2020   LDLCALC 153 (H) 11/23/2020   TRIG 148 11/23/2020   CHOLHDL 4.0 11/23/2020   Lab Results  Component Value Date   VD25OH 40.1 11/23/2020   VD25OH 40.4 09/07/2020   VD25OH 53.4 07/05/2020   Lab Results  Component Value Date   WBC 5.5 09/18/2019   HGB 15.4 09/18/2019   HCT 45.6 (H) 09/18/2019   MCV 89.8 09/18/2019   PLT 242 09/18/2019   Attestation Statements:   Reviewed by clinician on day of visit: allergies, medications, problem list, medical history, surgical history, family history, social history, and previous encounter notes.  I, 09/20/2019, CMA, am acting as Insurance claims handler for Energy manager, NP.  I have reviewed the above documentation for accuracy and completeness, and I agree with the above. -  Sabirin Baray d. Bridgitt Raggio, NP-C

## 2021-05-20 LAB — COMPREHENSIVE METABOLIC PANEL
ALT: 13 IU/L (ref 0–32)
AST: 17 IU/L (ref 0–40)
Albumin/Globulin Ratio: 1.6 (ref 1.2–2.2)
Albumin: 4.5 g/dL (ref 3.8–4.8)
Alkaline Phosphatase: 100 IU/L (ref 44–121)
BUN/Creatinine Ratio: 25 (ref 12–28)
BUN: 15 mg/dL (ref 8–27)
Bilirubin Total: 0.5 mg/dL (ref 0.0–1.2)
CO2: 27 mmol/L (ref 20–29)
Calcium: 9.5 mg/dL (ref 8.7–10.3)
Chloride: 99 mmol/L (ref 96–106)
Creatinine, Ser: 0.59 mg/dL (ref 0.57–1.00)
Globulin, Total: 2.8 g/dL (ref 1.5–4.5)
Glucose: 83 mg/dL (ref 65–99)
Potassium: 4.2 mmol/L (ref 3.5–5.2)
Sodium: 137 mmol/L (ref 134–144)
Total Protein: 7.3 g/dL (ref 6.0–8.5)
eGFR: 101 mL/min/{1.73_m2} (ref >59)

## 2021-05-20 LAB — LIPID PANEL
Chol/HDL Ratio: 3.7 ratio (ref 0.0–4.4)
Cholesterol, Total: 228 mg/dL — ABNORMAL HIGH (ref 100–199)
HDL: 61 mg/dL (ref >39)
LDL Chol Calc (NIH): 145 mg/dL — ABNORMAL HIGH (ref 0–99)
Triglycerides: 125 mg/dL (ref 0–149)
VLDL Cholesterol Cal: 22 mg/dL (ref 5–40)

## 2021-05-20 LAB — HEMOGLOBIN A1C
Est. average glucose Bld gHb Est-mCnc: 105 mg/dL
Hgb A1c MFr Bld: 5.3 % (ref 4.8–5.6)

## 2021-05-20 LAB — INSULIN, RANDOM: INSULIN: 8.4 u[IU]/mL (ref 2.6–24.9)

## 2021-05-20 LAB — VITAMIN D 25 HYDROXY (VIT D DEFICIENCY, FRACTURES): Vit D, 25-Hydroxy: 42.8 ng/mL (ref 30.0–100.0)

## 2021-06-09 ENCOUNTER — Ambulatory Visit (INDEPENDENT_AMBULATORY_CARE_PROVIDER_SITE_OTHER): Payer: 59 | Admitting: Adult Health

## 2021-06-27 ENCOUNTER — Encounter: Payer: Self-pay | Admitting: Orthopedic Surgery

## 2021-06-27 ENCOUNTER — Other Ambulatory Visit: Payer: Self-pay

## 2021-06-27 ENCOUNTER — Encounter (INDEPENDENT_AMBULATORY_CARE_PROVIDER_SITE_OTHER): Payer: Self-pay | Admitting: Adult Health

## 2021-06-27 ENCOUNTER — Ambulatory Visit (INDEPENDENT_AMBULATORY_CARE_PROVIDER_SITE_OTHER): Payer: 59 | Admitting: Adult Health

## 2021-06-27 ENCOUNTER — Ambulatory Visit: Payer: 59

## 2021-06-27 ENCOUNTER — Ambulatory Visit (INDEPENDENT_AMBULATORY_CARE_PROVIDER_SITE_OTHER): Payer: 59 | Admitting: Orthopedic Surgery

## 2021-06-27 VITALS — BP 123/84 | HR 72 | Ht 65.0 in | Wt 167.0 lb

## 2021-06-27 VITALS — BP 145/79 | HR 78 | Temp 98.2°F | Ht 65.0 in | Wt 162.0 lb

## 2021-06-27 DIAGNOSIS — M79641 Pain in right hand: Secondary | ICD-10-CM

## 2021-06-27 DIAGNOSIS — M79642 Pain in left hand: Secondary | ICD-10-CM

## 2021-06-27 DIAGNOSIS — E6609 Other obesity due to excess calories: Secondary | ICD-10-CM

## 2021-06-27 DIAGNOSIS — M67441 Ganglion, right hand: Secondary | ICD-10-CM

## 2021-06-27 DIAGNOSIS — I10 Essential (primary) hypertension: Secondary | ICD-10-CM

## 2021-06-27 DIAGNOSIS — E559 Vitamin D deficiency, unspecified: Secondary | ICD-10-CM | POA: Diagnosis not present

## 2021-06-27 DIAGNOSIS — E8881 Metabolic syndrome: Secondary | ICD-10-CM | POA: Diagnosis not present

## 2021-06-27 DIAGNOSIS — E782 Mixed hyperlipidemia: Secondary | ICD-10-CM

## 2021-06-27 DIAGNOSIS — M65332 Trigger finger, left middle finger: Secondary | ICD-10-CM

## 2021-06-27 DIAGNOSIS — Z6830 Body mass index (BMI) 30.0-30.9, adult: Secondary | ICD-10-CM

## 2021-06-27 MED ORDER — DICLOFENAC-MISOPROSTOL 50-0.2 MG PO TBEC: 1.0000 | DELAYED_RELEASE_TABLET | Freq: Two times a day (BID) | ORAL | 0 refills | Status: DC

## 2021-06-27 NOTE — Progress Notes (Signed)
[facility] Return  Assessment: Victoria Page is a 65 y.o. 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  At the last visit, we discussed a trigger finger, and she is still interested in an injection.  She does have a history of right thumb trigger finger release, and she may elect to proceed with left long finger trigger release.  No treatment was ordered today.  She has a couple of cysts forming on the dorsal aspect of the right long finger PIP, as well as the ring finger DIP joint.  The pathology was discussed in great detail.  As long as they are not specifically causing her issues, I would recommend against further treatment at this time.  She is however complaining of diffuse pains in both hands, including most of her fingers.  This is most likely related to diffuse osteoarthritis.  Tylenol is not providing sufficient relief.  As such, she is interested in a prescription medication.  I provided her with Arthrotec, as this provides protection for her stomach.  She stated her understanding.  If she wished to proceed with further treatment regarding the left long finger trigger finger, she should contact the clinic for a follow-up appointment  Meds ordered this encounter  Medications   Diclofenac-miSOPROStol 50-0.2 MG TBEC    Sig: Take 1 tablet by mouth 2 (two) times daily.    Dispense:  60 tablet    Refill:  0    Body mass index is 27.79 kg/m.  Follow-up: Return if symptoms worsen or fail to improve.   Subjective:  Chief Complaint  Patient presents with   Hand Pain    Bilateral// knots in the fingers and painful    History of Present Illness: Victoria Page is a 65 y.o. female who returns to clinic for repeat evaluation of bilateral hand  pain.  She is previously known to clinic, having been seen for a left long finger trigger finger.  She continues to have pain, catching and locking sensations in the long finger.  Since she was last seen in clinic, she has not sought additional treatment.  She is also noticed some "knots" developing on the dorsal aspect of the long finger, as well as the ring finger and the left index finger.  She has diffuse pain through most of her fingers.  She is taking Tylenol for this pain.  She denies fevers or chills.  No overlying skin changes  Review of Systems: No fevers or chills No numbness or tingling No chest pain No shortness of breath No bowel or bladder dysfunction No GI distress No headaches   Objective: BP 123/84   Pulse 72   Ht 5\' 5"  (1.651 m)   Wt 167 lb (75.8 kg)   BMI 27.79 kg/m   Physical Exam:  Alert and oriented.  No acute distress.  Evaluation of the left hand in clinic today demonstrates tenderness to palpation over the A1 pulley of the long finger.  Active triggering of the long finger was witnessed in clinic.  Diffuse nodules at the index finger, with minimal tenderness to palpation.  Slightly restricted flexion of all joints in her fingers.  Evaluation of the right hand demonstrates cystic swellings on the dorsal aspect of the PIP joints of the long finger,  as well as the DIP joint to the ring finger.  Mild tenderness to palpation.  No overlying skin change she does have some restricted range of motion of the DIP joint to the ring finger.  Otherwise, she is able to make a full fist.  Strength is 4/5.  Fingers are warm and well-perfused.  IMAGING: I personally ordered and reviewed the following images:  Is of bilateral hands were obtained in clinic today and demonstrates no acute injuries.  She does have some mild to moderate degenerative changes throughout the fingers of both hands.  There is diffuse loss of joint space, with small osteophytes.  No evidence of acute  injuries.  Minimal degenerative changes noted at the [facility] joint about the hands.  Impression: Diffuse loss of joint space within the fingers to both hands.   Oliver Barre, MD 06/27/2021 4:04 PM

## 2021-06-28 NOTE — Progress Notes (Signed)
Chief Complaint:   OBESITY Victoria Page is here to discuss her progress with her obesity treatment plan along with follow-up of her obesity related diagnoses. Victoria Page is on the Category 2 Plan and states she is following her eating plan approximately 40% of the time. Victoria Page states she is walking at work.  Today's visit was #: 25 Starting weight: 175 lbs Starting date: 02/18/2020 Today's weight: 162 lbs Today's date: 06/27/2021 Total lbs lost to date: 13 lbs Total lbs lost since last in-office visit: 2 lbs  Interim History: Victoria Page says that when she eats off plan, she states "I eat whatever I want".  Her boss often provides lunch frequently - [employer] classic sandwich (440 calories/29 grams protein), hamburger - home grilled - 112 grams (280 calories/19 grams protein).  Subjective:   1. Essential hypertension Discussed labs with patient today.  SBP elevated at office visit. She denies acute cardiac symptoms.  She denies tobacco/vape use. She is on HCTZ 25 mg daily.  2. Mixed hyperlipidemia Discussed labs with patient today.  Her father passed away at age 10 - suspected MI. 05/19/2021 Lipid Panel- Total and LDL elevated. She denies tobacco/vape use. She has never been on statin therapy. She is unsure if her PCP has dicussed this therapy with her. ASCVD risk score 8.8% - above goal.  3. Vitamin D deficiency Discussed labs with patient today.  On 05/19/2021, vitamin D level was 42.8 - below goal of 50. She has been on an OTC multivitamin for over 50 years.  4. Insulin resistance Discussed labs with patient today.  On 05/19/2021, BG 83, A1c 5.3, insulin level 8.4. She denies polyphagia.  She denies 1st degree family history of type 2 diabetes.  Assessment/Plan:   1. Essential hypertension Continue daily HCTZ 25 mg daily.  2. Mixed hyperlipidemia Follow-up with PCP about lipid treatment. Increase exercise, decrease saturated fat.  3. Vitamin D deficiency Continue  OTC multivitamin. Start OTC vitamin D3 2,000 IU daily.  4. Insulin resistance Increase protein intake and increase exercise.  5. Obesity with current BMI 27.0  Victoria Page is currently in the action stage of change. As such, her goal is to continue with weight loss efforts. She has agreed to the Category 2 Plan and keeping a food journal and adhering to recommended goals of 300-400 calories and 30 grams of protein at lunch.   Pack high protein snacks for work.  Follow-up with PCP - lipids.  Handouts:  High Protein/Low Carbohydrates.  Protein Content of Foods.  Exercise goals:  As is.  Behavioral modification strategies: increasing lean protein intake, decreasing simple carbohydrates, meal planning and cooking strategies, keeping healthy foods in the home, and planning for success.  Victoria Page has agreed to follow-up with our clinic in 3-4 weeks. She was informed of the importance of frequent follow-up visits to maximize her success with intensive lifestyle modifications for her multiple health conditions.   Objective:   Blood pressure (!) 145/79, pulse 78, temperature 98.2 F (36.8 C), height 5\' 5"  (1.651 m), weight 162 lb (73.5 kg), SpO2 99 %. Body mass index is 26.96 kg/m.  General: Cooperative, alert, well developed, in no acute distress. HEENT: Conjunctivae and lids unremarkable. Cardiovascular: Regular rhythm.  Lungs: Normal work of breathing. Neurologic: No focal deficits.   Lab Results  Component Value Date   CREATININE 0.59 05/19/2021   BUN 15 05/19/2021   NA 137 05/19/2021   K 4.2 05/19/2021   CL 99 05/19/2021   CO2 27 05/19/2021   Lab  Results  Component Value Date   ALT 13 05/19/2021   AST 17 05/19/2021   ALKPHOS 100 05/19/2021   BILITOT 0.5 05/19/2021   Lab Results  Component Value Date   HGBA1C 5.3 05/19/2021   HGBA1C 5.4 11/23/2020   HGBA1C 5.3 02/18/2020   Lab Results  Component Value Date   INSULIN 8.4 05/19/2021   INSULIN 4.8 11/23/2020    INSULIN 9.9 07/05/2020   INSULIN 11.4 02/18/2020   Lab Results  Component Value Date   TSH 1.830 02/18/2020   Lab Results  Component Value Date   CHOL 228 (H) 05/19/2021   HDL 61 05/19/2021   LDLCALC 145 (H) 05/19/2021   TRIG 125 05/19/2021   CHOLHDL 3.7 05/19/2021   Lab Results  Component Value Date   VD25OH 42.8 05/19/2021   VD25OH 40.1 11/23/2020   VD25OH 40.4 09/07/2020   Lab Results  Component Value Date   WBC 5.5 09/18/2019   HGB 15.4 09/18/2019   HCT 45.6 (H) 09/18/2019   MCV 89.8 09/18/2019   PLT 242 09/18/2019   Attestation Statements:   Reviewed by clinician on day of visit: allergies, medications, problem list, medical history, surgical history, family history, social history, and previous encounter notes.  Time spent on visit including pre-visit chart review and post-visit care and charting was 32 minutes.   I, Insurance claims handler, CMA, am acting as Energy manager for William Hamburger, NP.  I have reviewed the above documentation for accuracy and completeness, and I agree with the above. -  Cleda Imel d. Nickey Canedo, NP-C

## 2021-06-29 ENCOUNTER — Ambulatory Visit: Payer: 59 | Admitting: Orthopedic Surgery

## 2021-07-20 ENCOUNTER — Ambulatory Visit (INDEPENDENT_AMBULATORY_CARE_PROVIDER_SITE_OTHER): Payer: 59 | Admitting: Adult Health

## 2021-07-26 ENCOUNTER — Other Ambulatory Visit: Payer: Self-pay | Admitting: Family Medicine

## 2021-07-26 DIAGNOSIS — Z1231 Encounter for screening mammogram for malignant neoplasm of breast: Secondary | ICD-10-CM

## 2021-08-15 ENCOUNTER — Other Ambulatory Visit: Payer: Self-pay

## 2021-08-15 ENCOUNTER — Ambulatory Visit (INDEPENDENT_AMBULATORY_CARE_PROVIDER_SITE_OTHER): Payer: 59 | Admitting: Adult Health

## 2021-08-15 ENCOUNTER — Encounter (INDEPENDENT_AMBULATORY_CARE_PROVIDER_SITE_OTHER): Payer: Self-pay | Admitting: Adult Health

## 2021-08-15 VITALS — BP 132/82 | HR 71 | Temp 98.1°F | Ht 65.0 in | Wt 165.0 lb

## 2021-08-15 DIAGNOSIS — I10 Essential (primary) hypertension: Secondary | ICD-10-CM

## 2021-08-15 DIAGNOSIS — Z6830 Body mass index (BMI) 30.0-30.9, adult: Secondary | ICD-10-CM

## 2021-08-15 DIAGNOSIS — E6609 Other obesity due to excess calories: Secondary | ICD-10-CM | POA: Diagnosis not present

## 2021-08-15 NOTE — Progress Notes (Signed)
Chief Complaint:   OBESITY Victoria Page is here to discuss her progress with her obesity treatment plan along with follow-up of her obesity related diagnoses. Victoria Page is on the Category 2 Plan and keeping a food journal and adhering to recommended goals of 300-400 calories and 30 grams of protein at lunch and states she is following her eating plan approximately 70% of the time. Victoria Page states she is not exercising regularly.  Today's visit was #: 26 Starting weight: 175 lbs Starting date: 02/18/2020 Today's weight: 165 lbs Today's date: 08/15/2021 Total lbs lost to date: 10 lbs Total lbs lost since last in-office visit: 0  Interim History:  Victoria Page has been experiencing headache related to chronic sinusitis for the last 4 weeks. She uses OTC Zyrtec and Flonase to treat acute symptoms. Seen by Dr. Ezzard Standing 02/2021 - he felt 2-3 sinusitis with antibiotic treatment is not concerning - follow-up PRN.  Subjective:   1. Essential hypertension Victoria Page's SBP slightly elevated at office visit.  She denies lower extremity edema.   She denies cardiac sx's. She denies tobacco/vape use. PCP provides HCTZ 25 mg daily.  Assessment/Plan:   1. Essential hypertension Continue diuretic as directed by PCP.  2. Obesity with current BMI 27.6  Victoria Page is currently in the action stage of change. As such, her goal is to continue with weight loss efforts. She has agreed to the Category 2 Plan and keeping a food journal and adhering to recommended goals of 300-400 calories and 30 grams of protein at lunch.   Handout:  Recipe Guide II.  Exercise goals:  Increase daily walking - weather permitting.  Behavioral modification strategies: increasing lean protein intake, decreasing simple carbohydrates, meal planning and cooking strategies, keeping healthy foods in the home, and planning for success.  Victoria Page has agreed to follow-up with our clinic in 4 weeks. She was informed of the importance of  frequent follow-up visits to maximize her success with intensive lifestyle modifications for her multiple health conditions.   Objective:   Blood pressure 132/82, pulse 71, temperature 98.1 F (36.7 C), height 5\' 5"  (1.651 m), weight 165 lb (74.8 kg), SpO2 99 %. Body mass index is 27.46 kg/m.  General: Cooperative, alert, well developed, in no acute distress. HEENT: Conjunctivae and lids unremarkable. Cardiovascular: Regular rhythm.  Lungs: Normal work of breathing. Neurologic: No focal deficits.   Lab Results  Component Value Date   CREATININE 0.59 05/19/2021   BUN 15 05/19/2021   NA 137 05/19/2021   K 4.2 05/19/2021   CL 99 05/19/2021   CO2 27 05/19/2021   Lab Results  Component Value Date   ALT 13 05/19/2021   AST 17 05/19/2021   ALKPHOS 100 05/19/2021   BILITOT 0.5 05/19/2021   Lab Results  Component Value Date   HGBA1C 5.3 05/19/2021   HGBA1C 5.4 11/23/2020   HGBA1C 5.3 02/18/2020   Lab Results  Component Value Date   INSULIN 8.4 05/19/2021   INSULIN 4.8 11/23/2020   INSULIN 9.9 07/05/2020   INSULIN 11.4 02/18/2020   Lab Results  Component Value Date   TSH 1.830 02/18/2020   Lab Results  Component Value Date   CHOL 228 (H) 05/19/2021   HDL 61 05/19/2021   LDLCALC 145 (H) 05/19/2021   TRIG 125 05/19/2021   CHOLHDL 3.7 05/19/2021   Lab Results  Component Value Date   VD25OH 42.8 05/19/2021   VD25OH 40.1 11/23/2020   VD25OH 40.4 09/07/2020   Lab Results  Component Value Date  WBC 5.5 09/18/2019   HGB 15.4 09/18/2019   HCT 45.6 (H) 09/18/2019   MCV 89.8 09/18/2019   PLT 242 09/18/2019   Attestation Statements:   Reviewed by clinician on day of visit: allergies, medications, problem list, medical history, surgical history, family history, social history, and previous encounter notes.  Time spent on visit including pre-visit chart review and post-visit care and charting was 29 minutes.   I, Insurance claims handler, CMA, am acting as Energy manager for  Victoria Hamburger, NP.  I have reviewed the above documentation for accuracy and completeness, and I agree with the above. -  Donovin Kraemer d. Dayson Aboud, NP-C

## 2021-08-31 ENCOUNTER — Other Ambulatory Visit: Payer: Self-pay

## 2021-08-31 ENCOUNTER — Ambulatory Visit
Admission: RE | Admit: 2021-08-31 | Discharge: 2021-08-31 | Disposition: A | Discharge status: Home / Self Care | Payer: 59 | Source: Ambulatory Visit | Attending: Family Medicine | Admitting: Family Medicine

## 2021-08-31 DIAGNOSIS — Z1231 Encounter for screening mammogram for malignant neoplasm of breast: Secondary | ICD-10-CM

## 2021-09-15 ENCOUNTER — Encounter (INDEPENDENT_AMBULATORY_CARE_PROVIDER_SITE_OTHER): Payer: Self-pay | Admitting: Adult Health

## 2021-09-15 ENCOUNTER — Other Ambulatory Visit: Payer: Self-pay

## 2021-09-15 ENCOUNTER — Ambulatory Visit (INDEPENDENT_AMBULATORY_CARE_PROVIDER_SITE_OTHER): Payer: 59 | Admitting: Adult Health

## 2021-09-15 VITALS — BP 112/69 | HR 65 | Temp 98.1°F | Ht 65.0 in | Wt 167.0 lb

## 2021-09-15 DIAGNOSIS — Z6827 Body mass index (BMI) 27.0-27.9, adult: Secondary | ICD-10-CM | POA: Diagnosis not present

## 2021-09-15 DIAGNOSIS — I10 Essential (primary) hypertension: Secondary | ICD-10-CM

## 2021-09-15 DIAGNOSIS — F419 Anxiety disorder, unspecified: Secondary | ICD-10-CM

## 2021-09-15 DIAGNOSIS — E663 Overweight: Secondary | ICD-10-CM

## 2021-09-15 NOTE — Progress Notes (Signed)
Chief Complaint:   OBESITY Victoria Page is here to discuss her progress with her obesity treatment plan along with follow-up of her obesity related diagnoses. Victoria Page is on the Category 2 Plan and keeping a food journal and adhering to recommended goals of 300-400 calories and 30 grams of protein at lunch and states she is following her eating plan approximately 60% of the time. Victoria Page states she is walking for 20-30 minutes 2 times per week.  Today's visit was #: 27 Starting weight: 175 lbs Starting date: 02/18/2020 Today's weight: 167 lbs Today's date: 09/15/2021 Total lbs lost to date: 8 lbs Total lbs lost since last in-office visit: 0  Interim History:   Victoria Page's RMR on 02/18/2020 was 1930 - recommend to recheck metabolism next office visit. She will eat off plan when she takes the grandchildren to [employer]. FM 66 lbs, MM 101 lbs.  MM>FM, 101>66.   Subjective:   1. Essential hypertension BP/HR at goal at office visit. She denies tobacco/vape use. She denies CP with exertion. She is on HCTZ 25mg  QD.  2. Anxiety She is not working with a therapist, declined referral to [facility]. PDMP reviewed - alprazolam 0.5 mg - last refilled 06/08/2021.  Assessment/Plan:   1. Essential hypertension Check labs at next office visit. Remain well hydrated.  2. Anxiety Continue regular exercises.  Sparingly use PRN Alprazolam.  3. Obesity with BMI 27.9  08/08/2021 is currently in the action stage of change. As such, her goal is to continue with weight loss efforts. She has agreed to the Category 2 Plan and keeping a food journal and adhering to recommended goals of 300-400 calories and 30 grams of protein at lunch.   Check fasting labs/IC at next office visit.  Patient is aware to arrive 30 minutes prior to appointment.   Exercise goals:  As is.  Behavioral modification strategies: increasing lean protein intake, decreasing simple carbohydrates, meal planning and cooking strategies,  keeping healthy foods in the home, and planning for success.  Victoria Page has agreed to follow-up with our clinic in 4 weeks, fasting/IC. She was informed of the importance of frequent follow-up visits to maximize her success with intensive lifestyle modifications for her multiple health conditions.   Objective:   Blood pressure 112/69, pulse 65, temperature 98.1 F (36.7 C), height 5\' 5"  (1.651 m), weight 167 lb (75.8 kg), SpO2 97 %. Body mass index is 27.79 kg/m.  General: Cooperative, alert, well developed, in no acute distress. HEENT: Conjunctivae and lids unremarkable. Cardiovascular: Regular rhythm.  Lungs: Normal work of breathing. Neurologic: No focal deficits.   Lab Results  Component Value Date   CREATININE 0.59 05/19/2021   BUN 15 05/19/2021   NA 137 05/19/2021   K 4.2 05/19/2021   CL 99 05/19/2021   CO2 27 05/19/2021   Lab Results  Component Value Date   ALT 13 05/19/2021   AST 17 05/19/2021   ALKPHOS 100 05/19/2021   BILITOT 0.5 05/19/2021   Lab Results  Component Value Date   HGBA1C 5.3 05/19/2021   HGBA1C 5.4 11/23/2020   HGBA1C 5.3 02/18/2020   Lab Results  Component Value Date   INSULIN 8.4 05/19/2021   INSULIN 4.8 11/23/2020   INSULIN 9.9 07/05/2020   INSULIN 11.4 02/18/2020   Lab Results  Component Value Date   TSH 1.830 02/18/2020   Lab Results  Component Value Date   CHOL 228 (H) 05/19/2021   HDL 61 05/19/2021   LDLCALC 145 (H) 05/19/2021   TRIG  125 05/19/2021   CHOLHDL 3.7 05/19/2021   Lab Results  Component Value Date   VD25OH 42.8 05/19/2021   VD25OH 40.1 11/23/2020   VD25OH 40.4 09/07/2020   Lab Results  Component Value Date   WBC 5.5 09/18/2019   HGB 15.4 09/18/2019   HCT 45.6 (H) 09/18/2019   MCV 89.8 09/18/2019   PLT 242 09/18/2019   Attestation Statements:   Reviewed by clinician on day of visit: allergies, medications, problem list, medical history, surgical history, family history, social history, and previous  encounter notes.  I, Insurance claims handler, CMA, am acting as Energy manager for William Hamburger, NP.  I have reviewed the above documentation for accuracy and completeness, and I agree with the above. -  Jeffery Gammell d. Leiana Rund, NP-C

## 2021-10-07 ENCOUNTER — Other Ambulatory Visit: Payer: Self-pay

## 2021-10-07 ENCOUNTER — Ambulatory Visit
Admission: EM | Admit: 2021-10-07 | Discharge: 2021-10-07 | Disposition: A | Discharge status: Home / Self Care | Payer: 59 | Attending: Family Medicine | Admitting: Family Medicine

## 2021-10-07 ENCOUNTER — Encounter: Payer: Self-pay | Admitting: Emergency Medicine

## 2021-10-07 DIAGNOSIS — R110 Nausea: Secondary | ICD-10-CM | POA: Diagnosis not present

## 2021-10-07 DIAGNOSIS — J069 Acute upper respiratory infection, unspecified: Secondary | ICD-10-CM | POA: Diagnosis not present

## 2021-10-07 MED ORDER — PROMETHAZINE-DM 6.25-15 MG/5ML PO SYRP: 5.0000 mL | ORAL_SOLUTION | Freq: Four times a day (QID) | ORAL | 0 refills | Status: DC | PRN

## 2021-10-07 MED ORDER — PREDNISONE 20 MG PO TABS: 40.0000 mg | ORAL_TABLET | Freq: Every day | ORAL | 0 refills | Status: DC

## 2021-10-07 MED ORDER — ONDANSETRON 4 MG PO TBDP: 4.0000 mg | ORAL_TABLET | Freq: Three times a day (TID) | ORAL | 0 refills | Status: AC | PRN

## 2021-10-07 NOTE — ED Provider Notes (Signed)
[facility]    CSN: [id number] Arrival date & time: 10/07/21  1303      History   Chief Complaint Chief Complaint  Patient presents with   Nasal Congestion    HPI Victoria Page is a 66 y.o. female.   Presenting today with several day history of facial pressure, nasal congestion, neck and back soreness, sore throat, nausea, cough.  Denies fever, chills, body aches, chest pain, shortness of breath, abdominal pain, vomiting, diarrhea.  So far not trying anything over-the-counter for symptoms.  Does have a history of seasonal allergies on Zyrtec as needed, not taking any recently.  Declines viral testing today.   Past Medical History:  Diagnosis Date   Anxiety    Back pain    Biliary colic    Constipation    Gallstones    GERD (gastroesophageal reflux disease)    High cholesterol    High triglycerides    History of sinus problem    Hypertension    IBS (irritable bowel syndrome)    Joint pain    LLQ pain 08/19/2014   Plantar fasciitis    SOB (shortness of breath)     Patient Active Problem List   Diagnosis Date Noted   Acute non-recurrent pansinusitis 01/17/2021   Recurrent sinusitis 01/17/2021   Overweight with body mass index (BMI) of 27 to 27.9 in adult 11/04/2020   Other irritable bowel syndrome 10/24/2020   Cyst of ovary, right 09/28/2020   Urinary frequency 09/10/2020   Pelvic pain 09/10/2020   Pelvic relaxation due to cystocele, midline 09/10/2020   Class 1 obesity due to excess calories in adult 07/20/2020   Fatigue 07/05/2020   At risk for osteoporosis 07/05/2020   Mixed hyperlipidemia 06/16/2020   Insulin resistance 06/16/2020   Vitamin D deficiency 06/16/2020   At risk for heart disease 06/16/2020   Encounter for screening fecal occult blood testing 05/17/2020   History of UTI 05/17/2020   Encounter for gynecological examination with Papanicolaou smear of cervix 05/17/2020   Osteopenia 08/25/2019   Gastroesophageal reflux disease  07/28/2019   Essential hypertension 07/28/2019   Anxiety 07/28/2019   Hypertriglyceridemia 07/28/2019   Sinus pressure 07/28/2019   History of ovarian cyst 08/13/2018   Screening for colorectal cancer 08/13/2018   Routine adult health maintenance 08/13/2018   Uterine tenderness 11/16/2017   Vaginal atrophy 11/16/2017   Vulvar irritation 11/16/2017   UTI (urinary tract infection) 11/13/2017   Encounter for screening colonoscopy 09/05/2016   LLQ pain 08/19/2014   CERVICAL RADICULITIS 06/12/2007   SHOULDER PAIN 05/14/2007   NECK PAIN, ACUTE 05/14/2007   HIGH BLOOD PRESSURE 03/05/2007    Past Surgical History:  Procedure Laterality Date   COLONOSCOPY N/A 09/06/2016   Procedure: COLONOSCOPY;  Surgeon: Rogene Houston, MD;  Location: [facility];  Service: Endoscopy;  Laterality: N/A;  8:30   FLEXIBLE SIGMOIDOSCOPY      OB History     Gravida  5   Para  3   Term      Preterm      AB  1   Living  3      SAB  1   IAB      Ectopic      Multiple      Live Births               Home Medications    Prior to Admission medications   Medication Sig Start Date End Date Taking? Authorizing Provider  ondansetron (ZOFRAN-ODT) 4 MG disintegrating tablet Take 1 tablet (4 mg total) by mouth every 8 (eight) hours as needed for nausea or vomiting. 10/07/21  Yes Volney American, PA-C  predniSONE (DELTASONE) 20 MG tablet Take 2 tablets (40 mg total) by mouth daily with breakfast. 10/07/21  Yes Volney American, PA-C  promethazine-dextromethorphan (PROMETHAZINE-DM) 6.25-15 MG/5ML syrup Take 5 mLs by mouth 4 (four) times daily as needed. 10/07/21  Yes Volney American, PA-C  ALPRAZolam Duanne Moron) 0.5 MG tablet Take 0.5 mg by mouth daily as needed. 03/31/20   [provider]  CALCIUM PO Take by mouth. 1 chewable daily    [provider]  co-enzyme Q-10 30 MG capsule Take 30 mg by mouth 3 (three) times daily.    [provider]   hydrochlorothiazide (HYDRODIURIL) 25 MG tablet Take 25 mg by mouth daily.    [provider]  linaclotide Rolan Lipa) 72 MCG capsule Take 1 capsule (72 mcg total) by mouth daily before breakfast. 03/03/21   Armbruster, Carlota Raspberry, MD  Multiple Vitamin (MULTIVITAMIN ADULT PO) Take by mouth.    [provider]  pantoprazole (PROTONIX) 40 MG tablet Take 40 mg by mouth 2 (two) times daily.    [provider]    Family History Family History  Problem Relation Age of Onset   Lung cancer Mother    Anxiety disorder Mother    Alcoholism Mother    Heart attack Father    Sudden death Father    Diverticulitis Paternal Grandmother    Diverticulitis Brother        died from septic shock   COPD Brother    Breast cancer Daughter 22   Other Brother        1/2 brother-   Colon cancer Neg Hx     Social History Social History   Tobacco Use   Smoking status: Never   Smokeless tobacco: Never  Vaping Use   Vaping Use: Never used  Substance Use Topics   Alcohol use: Yes    Comment: few drinks a year   Drug use: No     Allergies   Shrimp [shellfish allergy] and Sulfa antibiotics   Review of Systems Review of Systems Per HPI  Physical Exam Triage Vital Signs ED Triage Vitals [10/07/21 1445]  Enc Vitals Group     BP 113/76     Pulse Rate 80     Resp 18     Temp 98.8 F (37.1 C)     Temp Source Oral     SpO2 96 %     Weight 168 lb (76.2 kg)     Height 5\' 5"  (1.651 m)     Head Circumference      Peak Flow      Pain Score 7     Pain Loc      Pain Edu?      Excl. in [place]?    No data found.  Updated Vital Signs BP 113/76 (BP Location: Right Arm)    Pulse 80    Temp 98.8 F (37.1 C) (Oral)    Resp 18    Ht 5\' 5"  (1.651 m)    Wt 168 lb (76.2 kg)    SpO2 96%    BMI 27.96 kg/m   Visual Acuity Right Eye Distance:   Left Eye Distance:   Bilateral Distance:    Right Eye Near:   Left Eye Near:    Bilateral Near:     Physical Exam  Vitals and nursing  note reviewed.  Constitutional:      Appearance: Normal appearance.  HENT:     Head: Atraumatic.     Right Ear: Tympanic membrane and external ear normal.     Left Ear: Tympanic membrane and external ear normal.     Nose: Rhinorrhea present.     Mouth/Throat:     Mouth: Mucous membranes are moist.     Pharynx: Posterior oropharyngeal erythema present.  Eyes:     Extraocular Movements: Extraocular movements intact.     Conjunctiva/sclera: Conjunctivae normal.  Cardiovascular:     Rate and Rhythm: Normal rate and regular rhythm.     Heart sounds: Normal heart sounds.  Pulmonary:     Effort: Pulmonary effort is normal.     Breath sounds: Normal breath sounds. No wheezing or rales.  Abdominal:     General: Bowel sounds are normal. There is no distension.     Palpations: Abdomen is soft.     Tenderness: There is no abdominal tenderness. There is no guarding.  Musculoskeletal:        General: Normal range of motion.     Cervical back: Normal range of motion and neck supple.  Lymphadenopathy:     Cervical: No cervical adenopathy.  Skin:    General: Skin is warm and dry.  Neurological:     Mental Status: She is alert and oriented to person, place, and time.  Psychiatric:        Mood and Affect: Mood normal.        Thought Content: Thought content normal.   UC Treatments / Results  Labs (all labs ordered are listed, but only abnormal results are displayed) Labs Reviewed - No data to display  EKG   Radiology No results found.  Procedures Procedures (including critical care time)  Medications Ordered in UC Medications - No data to display  Initial Impression / Assessment and Plan / UC Course  I have reviewed the triage vital signs and the nursing notes.  Pertinent labs & imaging results that were available during my care of the patient were reviewed by me and considered in my medical decision making (see chart for details).     Vital signs benign and reassuring, exam  suspicious for viral upper respiratory infection.  She declines viral testing today.  We will treat with Phenergan DM, supportive the counter medications and home care.  Prednisone sent in case not improving or worsening over the next few days.  Zofran for as needed nausea.  Return for acutely worsening symptoms.  Final Clinical Impressions(s) / UC Diagnoses   Final diagnoses:  Viral URI with cough  Nausea without vomiting   Discharge Instructions   None    ED Prescriptions     Medication Sig Dispense Auth. Provider   promethazine-dextromethorphan (PROMETHAZINE-DM) 6.25-15 MG/5ML syrup Take 5 mLs by mouth 4 (four) times daily as needed. 100 mL Volney American, PA-C   predniSONE (DELTASONE) 20 MG tablet Take 2 tablets (40 mg total) by mouth daily with breakfast. 10 tablet Volney American, PA-C   ondansetron (ZOFRAN-ODT) 4 MG disintegrating tablet Take 1 tablet (4 mg total) by mouth every 8 (eight) hours as needed for nausea or vomiting. 20 tablet Volney American, [place]      PDMP not reviewed this encounter.   Volney American, [place] 10/07/21 1529

## 2021-10-07 NOTE — ED Triage Notes (Addendum)
Pt reports facial pressure,nasal congestion, neck pain, nausea, sore throat for last several days. Pt denies any known fevers.  Pt denies wanting a covid/flu test, "I think its just sinuses."

## 2021-10-13 ENCOUNTER — Telehealth: Payer: Self-pay | Admitting: Gastroenterology

## 2021-10-13 ENCOUNTER — Other Ambulatory Visit: Payer: Self-pay | Admitting: Physician Assistant

## 2021-10-13 DIAGNOSIS — K5909 Other constipation: Secondary | ICD-10-CM

## 2021-10-13 NOTE — Telephone Encounter (Signed)
Inbound call from patient requesting to have dosage for Linzess increased to 19mcg and for script to be sent to pharmacy in chart please.

## 2021-10-14 MED ORDER — LINACLOTIDE 145 MCG PO CAPS: 145.0000 ug | ORAL_CAPSULE | Freq: Every day | ORAL | 1 refills | Status: DC

## 2021-10-14 NOTE — Telephone Encounter (Signed)
Thanks Jan. I am just seeing this now. I refilled it for her at if you can let her know. Thanks

## 2021-10-14 NOTE — Telephone Encounter (Signed)
Called and let patient know. She was appreciative

## 2021-10-17 ENCOUNTER — Other Ambulatory Visit: Payer: Self-pay

## 2021-10-17 ENCOUNTER — Encounter (INDEPENDENT_AMBULATORY_CARE_PROVIDER_SITE_OTHER): Payer: Self-pay | Admitting: Adult Health

## 2021-10-17 ENCOUNTER — Telehealth: Payer: Self-pay

## 2021-10-17 ENCOUNTER — Ambulatory Visit (INDEPENDENT_AMBULATORY_CARE_PROVIDER_SITE_OTHER): Payer: 59 | Admitting: Adult Health

## 2021-10-17 VITALS — BP 114/75 | HR 71 | Temp 98.0°F | Ht 65.0 in | Wt 167.0 lb

## 2021-10-17 DIAGNOSIS — Z9189 Other specified personal risk factors, not elsewhere classified: Secondary | ICD-10-CM | POA: Diagnosis not present

## 2021-10-17 DIAGNOSIS — Z6827 Body mass index (BMI) 27.0-27.9, adult: Secondary | ICD-10-CM

## 2021-10-17 DIAGNOSIS — E663 Overweight: Secondary | ICD-10-CM

## 2021-10-17 DIAGNOSIS — E559 Vitamin D deficiency, unspecified: Secondary | ICD-10-CM | POA: Diagnosis not present

## 2021-10-17 DIAGNOSIS — E8881 Metabolic syndrome: Secondary | ICD-10-CM

## 2021-10-17 DIAGNOSIS — I10 Essential (primary) hypertension: Secondary | ICD-10-CM

## 2021-10-17 DIAGNOSIS — E669 Obesity, unspecified: Secondary | ICD-10-CM

## 2021-10-17 NOTE — Progress Notes (Signed)
Chief Complaint:   OBESITY Victoria Page is here to discuss her progress with her obesity treatment plan along with follow-up of her obesity related diagnoses. Victoria Page is on the Category 2 Plan and keeping a food journal and adhering to recommended goals of 300-400 calories and 30 grams of protein at lunch and states she is following her eating plan approximately 20% of the time. Victoria Page states she is not exercising regularly.  Today's visit was #: 28 Starting weight: 175 lbs Starting date: 02/18/2020 Today's weight: 167 lbs Today's date: 10/17/2021 Total lbs lost to date: 8 lbs Total lbs lost since last in-office visit: 0  Interim History: Victoria Page was unable to complete IC for RMR data this morning.  Viral sinusitis - treated with prednisone 20 mg, 5 day course - 2 tablets at breakfast. (only completed 2 days out of 5 day course). She is still experiencing acute nasal drainage and pharyngitis. Recommend that she complete full steroid course as ordered.  She has consumed more soup, ice cream due to acute pharyngitis.  2023 Goal:  Maintain current weight loss - 167 pounds, 27.9 BMI.  Subjective:   1. Essential hypertension BP at goal. She is on HCTZ 25 mg daily.  2. Insulin resistance She denies 1st degree family history of T2D.  3. Vitamin D deficiency She is on a daily multivitamin.  4. At risk for osteoporosis Victoria Page is at higher risk of osteopenia and osteoporosis due to Vitamin D deficiency and obesity.   Assessment/Plan:   1. Essential hypertension Check labs.  - Comprehensive metabolic panel  2. Insulin resistance Check labs today.  - Insulin, random - Hemoglobin A1c  3. Vitamin D deficiency Check vitamin D level today.  - VITAMIN D 25 Hydroxy (Vit-D Deficiency, Fractures)  4. At risk for osteoporosis Victoria Page was given approximately 15 minutes of osteoporosis prevention counseling today. [name] is at risk for osteopenia and osteoporosis due to  her Vitamin D deficiency. She was encouraged to take her Vit D and follow her higher calcium diet and increase strengthening exercise to help strengthen her bones and decrease her risk of osteopenia and osteoporosis.  5. Obesity with BMI 27.9  Victoria Page is currently in the action stage of change. As such, her goal is to continue with weight loss efforts. She has agreed to the Category 2 Plan and keeping a food journal and adhering to recommended goals of 300-400 calories and 30 grams of protein at lunch.   Check IC at next office visit.  Exercise goals: No exercise has been prescribed at this time.  Behavioral modification strategies: increasing lean protein intake, decreasing simple carbohydrates, meal planning and cooking strategies, keeping healthy foods in the home, and planning for success.  Victoria Page has agreed to follow-up with our clinic in 4 weeks for IC. She was informed of the importance of frequent follow-up visits to maximize her success with intensive lifestyle modifications for her multiple health conditions.   Victoria Page was informed we would discuss her lab results at her next visit unless there is a critical issue that needs to be addressed sooner. Victoria Page agreed to keep her next visit at the agreed upon time to discuss these results.  Objective:   Blood pressure 114/75, pulse 71, temperature 98 F (36.7 C), height 5\' 5"  (1.651 m), weight 167 lb (75.8 kg), SpO2 98 %. Body mass index is 27.79 kg/m.  General: Cooperative, alert, well developed, in no acute distress. HEENT: Conjunctivae and lids unremarkable. Cardiovascular: Regular rhythm.  Lungs: Normal  work of breathing. Neurologic: No focal deficits.   Lab Results  Component Value Date   CREATININE 0.59 05/19/2021   BUN 15 05/19/2021   NA 137 05/19/2021   K 4.2 05/19/2021   CL 99 05/19/2021   CO2 27 05/19/2021   Lab Results  Component Value Date   ALT 13 05/19/2021   AST 17 05/19/2021   ALKPHOS 100 05/19/2021    BILITOT 0.5 05/19/2021   Lab Results  Component Value Date   HGBA1C 5.3 05/19/2021   HGBA1C 5.4 11/23/2020   HGBA1C 5.3 02/18/2020   Lab Results  Component Value Date   INSULIN 8.4 05/19/2021   INSULIN 4.8 11/23/2020   INSULIN 9.9 07/05/2020   INSULIN 11.4 02/18/2020   Lab Results  Component Value Date   TSH 1.830 02/18/2020   Lab Results  Component Value Date   CHOL 228 (H) 05/19/2021   HDL 61 05/19/2021   LDLCALC 145 (H) 05/19/2021   TRIG 125 05/19/2021   CHOLHDL 3.7 05/19/2021   Lab Results  Component Value Date   VD25OH 42.8 05/19/2021   VD25OH 40.1 11/23/2020   VD25OH 40.4 09/07/2020   Lab Results  Component Value Date   WBC 5.5 09/18/2019   HGB 15.4 09/18/2019   HCT 45.6 (H) 09/18/2019   MCV 89.8 09/18/2019   PLT 242 09/18/2019   Attestation Statements:   Reviewed by clinician on day of visit: allergies, medications, problem list, medical history, surgical history, family history, social history, and previous encounter notes.  I, Insurance claims handler, CMA, am acting as Energy manager for Victoria Hamburger, NP.  I have reviewed the above documentation for accuracy and completeness, and I agree with the above. -  Marelin Tat d. Audreena Sachdeva, NP-C

## 2021-10-17 NOTE — Telephone Encounter (Signed)
PA submitted via CoverMyMeds for Linzess .  Key: [id number].  Patient has tried Miralax, Linzess 72 mcgs and Amitiza

## 2021-10-18 ENCOUNTER — Encounter (INDEPENDENT_AMBULATORY_CARE_PROVIDER_SITE_OTHER): Payer: Self-pay | Admitting: Adult Health

## 2021-10-18 ENCOUNTER — Other Ambulatory Visit (INDEPENDENT_AMBULATORY_CARE_PROVIDER_SITE_OTHER): Payer: Self-pay | Admitting: Adult Health

## 2021-10-18 DIAGNOSIS — E782 Mixed hyperlipidemia: Secondary | ICD-10-CM

## 2021-10-18 LAB — COMPREHENSIVE METABOLIC PANEL
ALT: 12 IU/L (ref 0–32)
AST: 19 IU/L (ref 0–40)
Albumin/Globulin Ratio: 1.7 (ref 1.2–2.2)
Albumin: 4.6 g/dL (ref 3.8–4.8)
Alkaline Phosphatase: 89 IU/L (ref 44–121)
BUN/Creatinine Ratio: 27 (ref 12–28)
BUN: 16 mg/dL (ref 8–27)
Bilirubin Total: 0.4 mg/dL (ref 0.0–1.2)
CO2: 25 mmol/L (ref 20–29)
Calcium: 9.9 mg/dL (ref 8.7–10.3)
Chloride: 101 mmol/L (ref 96–106)
Creatinine, Ser: 0.59 mg/dL (ref 0.57–1.00)
Globulin, Total: 2.7 g/dL (ref 1.5–4.5)
Glucose: 81 mg/dL (ref 70–99)
Potassium: 4.8 mmol/L (ref 3.5–5.2)
Sodium: 141 mmol/L (ref 134–144)
Total Protein: 7.3 g/dL (ref 6.0–8.5)
eGFR: 101 mL/min/{1.73_m2} (ref >59)

## 2021-10-18 LAB — HEMOGLOBIN A1C
Est. average glucose Bld gHb Est-mCnc: 105 mg/dL
Hgb A1c MFr Bld: 5.3 % (ref 4.8–5.6)

## 2021-10-18 LAB — VITAMIN D 25 HYDROXY (VIT D DEFICIENCY, FRACTURES): Vit D, 25-Hydroxy: 43.7 ng/mL (ref 30.0–100.0)

## 2021-10-18 LAB — INSULIN, RANDOM: INSULIN: 8.4 u[IU]/mL (ref 2.6–24.9)

## 2021-10-18 NOTE — Telephone Encounter (Signed)
Case [id number]

## 2021-10-18 NOTE — Telephone Encounter (Signed)
PA denied since Linzess is not on the patient's formulary.  Formulary alternative is Trulance.  PA for Trulance 3 mg once daily submitted to see if it would be covered.  If patient were to try and fail Trulance "an exception of benefits" for Linzess would need to be initiated to [employer], Appeals at [phone].  Note: It may take up to 4 days to get a determination onTrulance

## 2021-10-18 NOTE — Telephone Encounter (Signed)
Please advise.Per lab can be added if you want to order.

## 2021-10-18 NOTE — Telephone Encounter (Signed)
Ok to send Trulance 3 mg once daily? [employer] indicated PA is approved and patient has to try and fail Trulance before they would approve Linzess. Please advise.

## 2021-10-18 NOTE — Telephone Encounter (Signed)
done

## 2021-10-19 LAB — LIPID PANEL
Chol/HDL Ratio: 4 ratio (ref 0.0–4.4)
Cholesterol, Total: 239 mg/dL — ABNORMAL HIGH (ref 100–199)
HDL: 60 mg/dL (ref >39)
LDL Chol Calc (NIH): 158 mg/dL — ABNORMAL HIGH (ref 0–99)
Triglycerides: 120 mg/dL (ref 0–149)
VLDL Cholesterol Cal: 21 mg/dL (ref 5–40)

## 2021-10-19 LAB — SPECIMEN STATUS REPORT

## 2021-10-19 MED ORDER — TRULANCE 3 MG PO TABS: 3.0000 mg | ORAL_TABLET | Freq: Every day | ORAL | 2 refills | Status: DC

## 2021-10-19 NOTE — Telephone Encounter (Signed)
Trulance sent to pharmacy.called and let patient know she will need to try and fail this first before insurance will cover Linzess.

## 2021-10-19 NOTE — Telephone Encounter (Signed)
Yes okay to try Trulance. May be good for her to follow up with me in the office if symptoms persist.

## 2021-11-03 ENCOUNTER — Telehealth: Payer: Self-pay

## 2021-11-03 NOTE — Telephone Encounter (Signed)
PA completed for pantoprazole 40mg  through [employer]. Will await response from pt's insurance. ? ?KeyAnselm Pancoast - PA Case ID: [id number] - Rx #[id number] ? ?

## 2021-11-04 NOTE — Telephone Encounter (Signed)
PA for pantoprazole 40 mg approved through 11-04-2022 ?

## 2021-11-15 ENCOUNTER — Ambulatory Visit (INDEPENDENT_AMBULATORY_CARE_PROVIDER_SITE_OTHER): Payer: 59 | Admitting: Adult Health

## 2021-12-08 ENCOUNTER — Encounter (INDEPENDENT_AMBULATORY_CARE_PROVIDER_SITE_OTHER): Payer: Self-pay | Admitting: Adult Health

## 2021-12-08 ENCOUNTER — Ambulatory Visit (INDEPENDENT_AMBULATORY_CARE_PROVIDER_SITE_OTHER): Payer: 59 | Admitting: Adult Health

## 2021-12-08 VITALS — BP 120/80 | HR 66 | Temp 98.0°F | Ht 65.0 in | Wt 166.0 lb

## 2021-12-08 DIAGNOSIS — Z6827 Body mass index (BMI) 27.0-27.9, adult: Secondary | ICD-10-CM

## 2021-12-08 DIAGNOSIS — R0602 Shortness of breath: Secondary | ICD-10-CM | POA: Diagnosis not present

## 2021-12-08 DIAGNOSIS — E559 Vitamin D deficiency, unspecified: Secondary | ICD-10-CM | POA: Diagnosis not present

## 2021-12-08 DIAGNOSIS — E782 Mixed hyperlipidemia: Secondary | ICD-10-CM

## 2021-12-08 DIAGNOSIS — E669 Obesity, unspecified: Secondary | ICD-10-CM | POA: Diagnosis not present

## 2021-12-08 DIAGNOSIS — I10 Essential (primary) hypertension: Secondary | ICD-10-CM

## 2021-12-08 DIAGNOSIS — Z9189 Other specified personal risk factors, not elsewhere classified: Secondary | ICD-10-CM

## 2021-12-10 ENCOUNTER — Ambulatory Visit
Admission: EM | Admit: 2021-12-10 | Discharge: 2021-12-10 | Disposition: A | Discharge status: Home / Self Care | Payer: 59 | Attending: Family Medicine | Admitting: Family Medicine

## 2021-12-10 DIAGNOSIS — J069 Acute upper respiratory infection, unspecified: Secondary | ICD-10-CM | POA: Insufficient documentation

## 2021-12-10 DIAGNOSIS — J029 Acute pharyngitis, unspecified: Secondary | ICD-10-CM | POA: Insufficient documentation

## 2021-12-10 DIAGNOSIS — H6593 Unspecified nonsuppurative otitis media, bilateral: Secondary | ICD-10-CM | POA: Diagnosis present

## 2021-12-10 DIAGNOSIS — K1370 Unspecified lesions of oral mucosa: Secondary | ICD-10-CM | POA: Insufficient documentation

## 2021-12-10 DIAGNOSIS — K219 Gastro-esophageal reflux disease without esophagitis: Secondary | ICD-10-CM | POA: Diagnosis not present

## 2021-12-10 LAB — POCT RAPID STREP A (OFFICE): Rapid Strep A Screen: NEGATIVE

## 2021-12-10 MED ORDER — LIDOCAINE VISCOUS HCL 2 % MT SOLN: 10.0000 mL | OROMUCOSAL | 0 refills | Status: DC | PRN

## 2021-12-10 MED ORDER — PREDNISONE 20 MG PO TABS: 40.0000 mg | ORAL_TABLET | Freq: Every day | ORAL | 0 refills | Status: DC

## 2021-12-10 MED ORDER — IBUPROFEN 600 MG PO TABS: 600.0000 mg | ORAL_TABLET | Freq: Four times a day (QID) | ORAL | 0 refills | Status: DC | PRN

## 2021-12-10 MED ORDER — FLUTICASONE PROPIONATE 50 MCG/ACT NA SUSP
1.0000 | Freq: Two times a day (BID) | NASAL | 2 refills | Status: AC
Start: 2021-12-10 — End: ?

## 2021-12-10 MED ORDER — PANTOPRAZOLE SODIUM 40 MG PO TBEC: 40.0000 mg | DELAYED_RELEASE_TABLET | Freq: Every day | ORAL | 0 refills | Status: AC

## 2021-12-10 NOTE — ED Triage Notes (Signed)
Pt states there is a sore in the roof of her mouth ? ?Pt states her sore throat started yesterday ? ?Pt states her right ear is sore and the right side of her throat is achy ? ?Pt states she tried Naproxen and it helped a little bit ? ?Denies Fever ?

## 2021-12-10 NOTE — Discharge Instructions (Addendum)
Do not take any ibuprofen while you are taking the prednisone.  May start the ibuprofen as needed after completion of the prednisone course.  This is to help avoid any stomach irritation ?

## 2021-12-11 LAB — COVID-19, FLU A+B NAA
Influenza A, NAA: NOT DETECTED
Influenza B, NAA: NOT DETECTED
SARS-CoV-2, NAA: NOT DETECTED

## 2021-12-11 NOTE — ED Provider Notes (Addendum)
?[facility] ? ? ? ?CSN: [id number] ?Arrival date & time: 12/10/21  1318 ? ? ?  ? ?History   ?Chief Complaint ?Chief Complaint  ?Patient presents with  ? Sore Throat  ? ? ?HPI ?Victoria Page is a 66 y.o. female.  ? ?Presenting today with sore throat, sore to the roof of her mouth, right ear pain, swollen lymph nodes in neck x1 day.  Denies fever, chills, chest pain, shortness of breath, cough, abdominal pain, nausea vomiting or diarrhea.  Has been taking naproxen with mild relief temporarily.  No known sick contacts recently. ? ? ?Past Medical History:  ?Diagnosis Date  ? Anxiety   ? Back pain   ? Biliary colic   ? Constipation   ? Gallstones   ? GERD (gastroesophageal reflux disease)   ? High cholesterol   ? High triglycerides   ? History of sinus problem   ? Hypertension   ? IBS (irritable bowel syndrome)   ? Joint pain   ? LLQ pain 08/19/2014  ? Plantar fasciitis   ? SOB (shortness of breath)   ? ? ?Patient Active Problem List  ? Diagnosis Date Noted  ? SOB (shortness of breath) on exertion 12/08/2021  ? Acute non-recurrent pansinusitis 01/17/2021  ? Recurrent sinusitis 01/17/2021  ? Overweight with body mass index (BMI) of 27 to 27.9 in adult 11/04/2020  ? Other irritable bowel syndrome 10/24/2020  ? Cyst of ovary, right 09/28/2020  ? Urinary frequency 09/10/2020  ? Pelvic pain 09/10/2020  ? Pelvic relaxation due to cystocele, midline 09/10/2020  ? Class 1 obesity due to excess calories in adult 07/20/2020  ? Fatigue 07/05/2020  ? At risk for osteoporosis 07/05/2020  ? Mixed hyperlipidemia 06/16/2020  ? Insulin resistance 06/16/2020  ? Vitamin D deficiency 06/16/2020  ? At risk for heart disease 06/16/2020  ? Encounter for screening fecal occult blood testing 05/17/2020  ? History of UTI 05/17/2020  ? Encounter for gynecological examination with Papanicolaou smear of cervix 05/17/2020  ? Osteopenia 08/25/2019  ? Gastroesophageal reflux disease 07/28/2019  ? Essential hypertension 07/28/2019  ?  Anxiety 07/28/2019  ? Hypertriglyceridemia 07/28/2019  ? Sinus pressure 07/28/2019  ? History of ovarian cyst 08/13/2018  ? Screening for colorectal cancer 08/13/2018  ? Routine adult health maintenance 08/13/2018  ? Uterine tenderness 11/16/2017  ? Vaginal atrophy 11/16/2017  ? Vulvar irritation 11/16/2017  ? UTI (urinary tract infection) 11/13/2017  ? Encounter for screening colonoscopy 09/05/2016  ? LLQ pain 08/19/2014  ? CERVICAL RADICULITIS 06/12/2007  ? SHOULDER PAIN 05/14/2007  ? NECK PAIN, ACUTE 05/14/2007  ? HIGH BLOOD PRESSURE 03/05/2007  ? ? ?Past Surgical History:  ?Procedure Laterality Date  ? COLONOSCOPY N/A 09/06/2016  ? Procedure: COLONOSCOPY;  Surgeon: Malissa Hippo, MD;  [facility];  Service: Endoscopy;  Laterality: N/A;  8:30  ? FLEXIBLE SIGMOIDOSCOPY    ? ? ?OB History   ? ? Gravida  ?5  ? Para  ?3  ? Term  ?   ? Preterm  ?   ? AB  ?1  ? Living  ?3  ?  ? ? SAB  ?1  ? IAB  ?   ? Ectopic  ?   ? Multiple  ?   ? Live Births  ?   ?   ?  ?  ? ? ? ?Home Medications   ? ?Prior to Admission medications   ?Medication Sig Start Date End Date Taking? Authorizing Provider  ?fluticasone (FLONASE)  50 MCG/ACT nasal spray Place 1 spray into both nostrils 2 (two) times daily. 12/10/21  Yes Particia Nearing, PA-C  ?ibuprofen (ADVIL) 600 MG tablet Take 1 tablet (600 mg total) by mouth every 6 (six) hours as needed. 12/10/21  Yes Particia Nearing, PA-C  ?lidocaine (XYLOCAINE) 2 % solution Use as directed 10 mLs in the mouth or throat every 3 (three) hours as needed for mouth pain. 12/10/21  Yes Particia Nearing, PA-C  ?pantoprazole (PROTONIX) 40 MG tablet Take 1 tablet (40 mg total) by mouth daily. 12/10/21  Yes Particia Nearing, PA-C  ?predniSONE (DELTASONE) 20 MG tablet Take 2 tablets (40 mg total) by mouth daily with breakfast. 12/10/21  Yes Particia Nearing, PA-C  ?ALPRAZolam (XANAX) 0.5 MG tablet Take 0.5 mg by mouth daily as needed. 03/31/20   [provider]  ?CALCIUM  PO Take by mouth. 1 chewable daily    [provider]  ?co-enzyme Q-10 30 MG capsule Take 30 mg by mouth 3 (three) times daily.    [provider]  ?hydrochlorothiazide (HYDRODIURIL) 25 MG tablet Take 25 mg by mouth daily.    [provider]  ?Multiple Vitamin (MULTIVITAMIN ADULT PO) Take by mouth.    [provider]  ?ondansetron (ZOFRAN-ODT) 4 MG disintegrating tablet Take 1 tablet (4 mg total) by mouth every 8 (eight) hours as needed for nausea or vomiting. 10/07/21   Particia Nearing, PA-C  ?pantoprazole (PROTONIX) 40 MG tablet TAKE (1) TABLET BY MOUTH ONCE TO TWICE DAILY. 10/14/21   Armbruster, Willaim Rayas, MD  ?Plecanatide (TRULANCE) 3 MG TABS Take 3 mg by mouth daily. 10/19/21   Armbruster, Willaim Rayas, MD  ? ? ?Family History ?Family History  ?Problem Relation Age of Onset  ? Lung cancer Mother   ? Anxiety disorder Mother   ? Alcoholism Mother   ? Heart attack Father   ? Sudden death Father   ? Diverticulitis Paternal Grandmother   ? Diverticulitis Brother   ?     died from septic shock  ? COPD Brother   ? Breast cancer Daughter 66  ? Other Brother   ?     1/2 brother-  ? Colon cancer Neg Hx   ? ? ?Social History ?Social History  ? ?Tobacco Use  ? Smoking status: Never  ?  Passive exposure: Never  ? Smokeless tobacco: Never  ?Vaping Use  ? Vaping Use: Never used  ?Substance Use Topics  ? Alcohol use: Yes  ?  Comment: few drinks a year  ? Drug use: No  ? ? ? ?Allergies   ?Shrimp [shellfish allergy] and Sulfa antibiotics ? ? ?Review of Systems ?Review of Systems ?Per HPI ? ?Physical Exam ?Triage Vital Signs ?ED Triage Vitals  ?Enc Vitals Group  ?   BP 12/10/21 1537 (!) 153/90  ?   Pulse Rate 12/10/21 1537 63  ?   Resp 12/10/21 1537 20  ?   Temp 12/10/21 1537 97.7 ?F (36.5 ?C)  ?   Temp Source 12/10/21 1537 Oral  ?   SpO2 12/10/21 1537 98 %  ?   Weight --   ?   Height --   ?   Head Circumference --   ?   Peak Flow --   ?   Pain Score 12/10/21 1533 7  ?   Pain Loc --   ?   Pain  Edu? --   ?   Excl. in GC? --   ? ?No  data found. ? ?Updated Vital Signs ?BP (!) 153/90 (BP Location: Right Arm)   Pulse 63   Temp 97.7 ?F (36.5 ?C) (Oral)   Resp 20   SpO2 98%  ? ?Visual Acuity ?Right Eye Distance:   ?Left Eye Distance:   ?Bilateral Distance:   ? ?Right Eye Near:   ?Left Eye Near:    ?Bilateral Near:    ? ?Physical Exam ?Vitals and nursing note reviewed.  ?Constitutional:   ?   Appearance: Normal appearance.  ?HENT:  ?   Head: Atraumatic.  ?   Right Ear: External ear normal.  ?   Left Ear: External ear normal.  ?   Ears:  ?   Comments: Bilateral middle ear effusion ?   Nose: Rhinorrhea present.  ?   Mouth/Throat:  ?   Mouth: Mucous membranes are moist.  ?   Pharynx: Posterior oropharyngeal erythema present. No oropharyngeal exudate.  ?Eyes:  ?   Extraocular Movements: Extraocular movements intact.  ?   Conjunctiva/sclera: Conjunctivae normal.  ?Cardiovascular:  ?   Rate and Rhythm: Normal rate and regular rhythm.  ?   Heart sounds: Normal heart sounds.  ?Pulmonary:  ?   Effort: Pulmonary effort is normal.  ?   Breath sounds: Normal breath sounds. No wheezing or rales.  ?Musculoskeletal:     ?   General: Normal range of motion.  ?   Cervical back: Normal range of motion and neck supple.  ?Skin: ?   General: Skin is warm and dry.  ?Neurological:  ?   Mental Status: She is alert and oriented to person, place, and time.  ?Psychiatric:     ?   Mood and Affect: Mood normal.     ?   Thought Content: Thought content normal.  ? ? ? ?UC Treatments / Results  ?Labs ?(all labs ordered are listed, but only abnormal results are displayed) ?Labs Reviewed  ?CULTURE, GROUP A STREP [facility])  ?COVID-19, FLU A+B NAA  ?POCT RAPID STREP A (OFFICE)  ? ? ?EKG ? ? ?Radiology ?No results found. ? ?Procedures ?Procedures (including critical care time) ? ?Medications Ordered in UC ?Medications - No data to display ? ?Initial Impression / Assessment and Plan / UC Course  ?I have reviewed the triage vital signs and the  nursing notes. ? ?Pertinent labs & imaging results that were available during my care of the patient were reviewed by me and considered in my medical decision making (see chart for details). ? ?  ? ?Vital signs reassur

## 2021-12-13 LAB — CULTURE, GROUP A STREP (THRC)

## 2021-12-14 NOTE — Progress Notes (Signed)
? ? ? ?Chief Complaint:  ? ?OBESITY ?Victoria Page is here to discuss her progress with her obesity treatment plan along with follow-up of her obesity related diagnoses. Victoria Page is on the Category 2 Plan and keeping a food journal and adhering to recommended goals of 300-400 calories and 30 grams of protein at lunch and states she is following her eating plan approximately 80% of the time. Victoria Page states she is walking for 35-45 minutes 3 times per week. ? ?Today's visit was #: 29 ?Starting weight: 175 lbs ?Starting date: 02/18/2020 ?Today's weight: 166 lbs ?Today's date: 12/08/2021 ?Total lbs lost to date: 9 lbs ?Total lbs lost since last in-office visit: 1 lb ? ?Interim History:  ?On 02/08/2020, RMR 1930. ?On 12/08/2021, RMR 1517.  Metabolism down 418 calories. ?Great grandchild delivered 1 month early - Zamari - Boy - 6 lbs, 1 oz. ? ?Subjective:  ? ?1. SOB (shortness of breath) on exertion ?Discussed labs with patient today.  ?She endorses dyspnea with extreme exertion, denies CP. ? ?2. Mixed hyperlipidemia ?Worsening.  Discussed labs with patient today.  ?On 10/17/2021, lipid panel - total 239, LDL 158, HDL 60, TG 120. ?Father - MI at age 35.  Mother - lung cancer at age 44. ?Sister - hyperlipidemia - CABG x3 - currently 16.   ?ASCVD risk stratification-6.3%.  Optimal- 3.8%. ? ?3. Vitamin D deficiency ?Discussed labs with patient today.  ?On 10/17/2021, vitamin D level - 43.7 - below goal of 50-70. ?Only on OTC multivitamin. ? ?4. At risk for heart disease ?Laverle is at higher than average risk for cardiovascular disease due to worsening hyperlipidemia and obesity. ? ?Assessment/Plan:  ? ?1. SOB (shortness of breath) on exertion ?Check IC. ? ?2. Mixed hyperlipidemia ?Follow-up with PCP - Recommend coronary artery calcium scoring. ? ?3. Vitamin D deficiency ?Continue OTC multivitamin. ?Restart ergocalciferol 50,000 IU once weekly, dispense 4 capsules, 0 refills. ? ?4. At risk for heart disease ?Victoria Page was given  approximately 15 minutes of coronary artery disease prevention counseling today. She is 66 y.o. female and has risk factors for heart disease including obesity. We discussed intensive lifestyle modifications today with an emphasis on specific weight loss instructions and strategies. ? ?Repetitive spaced learning was employed today to elicit superior memory formation and behavioral change.  ? ?5. Obesity with BMI 27.8 ? ?Victoria Page is currently in the action stage of change. As such, her goal is to continue with weight loss efforts. She has agreed to the Category 1 Plan +100 calories and keeping a food journal and adhering to recommended goals of 300-400 calories and 30 grams of protein at lunch.  ? ?Convert to Category 1 +100 calories due to decreased RMR. ? ?Exercise goals:  As is. ? ?Behavioral modification strategies: increasing lean protein intake, decreasing simple carbohydrates, meal planning and cooking strategies, keeping healthy foods in the home, and planning for success. ? ?Victoria Page has agreed to follow-up with our clinic in 3-4 weeks. She was informed of the importance of frequent follow-up visits to maximize her success with intensive lifestyle modifications for her multiple health conditions.  ? ?Objective:  ? ?Blood pressure 120/80, pulse 66, temperature 98 ?F (36.7 ?C), height 5\' 5"  (1.651 m), weight 166 lb (75.3 kg), SpO2 98 %. ?Body mass index is 27.62 kg/m?. ? ?General: Cooperative, alert, well developed, in no acute distress. ?HEENT: Conjunctivae and lids unremarkable. ?Cardiovascular: Regular rhythm.  ?Lungs: Normal work of breathing. ?Neurologic: No focal deficits.  ? ?Lab Results  ?Component Value Date  ? CREATININE  0.59 10/17/2021  ? BUN 16 10/17/2021  ? NA 141 10/17/2021  ? K 4.8 10/17/2021  ? CL 101 10/17/2021  ? CO2 25 10/17/2021  ? ?Lab Results  ?Component Value Date  ? ALT 12 10/17/2021  ? AST 19 10/17/2021  ? ALKPHOS 89 10/17/2021  ? BILITOT 0.4 10/17/2021  ? ?Lab Results  ?Component  Value Date  ? HGBA1C 5.3 10/17/2021  ? HGBA1C 5.3 05/19/2021  ? HGBA1C 5.4 11/23/2020  ? HGBA1C 5.3 02/18/2020  ? ?Lab Results  ?Component Value Date  ? INSULIN 8.4 10/17/2021  ? INSULIN 8.4 05/19/2021  ? INSULIN 4.8 11/23/2020  ? INSULIN 9.9 07/05/2020  ? INSULIN 11.4 02/18/2020  ? ?Lab Results  ?Component Value Date  ? TSH 1.830 02/18/2020  ? ?Lab Results  ?Component Value Date  ? CHOL 239 (H) 10/17/2021  ? HDL 60 10/17/2021  ? LDLCALC 158 (H) 10/17/2021  ? TRIG 120 10/17/2021  ? CHOLHDL 4.0 10/17/2021  ? ?Lab Results  ?Component Value Date  ? VD25OH 43.7 10/17/2021  ? VD25OH 42.8 05/19/2021  ? VD25OH 40.1 11/23/2020  ? ?Lab Results  ?Component Value Date  ? WBC 5.5 09/18/2019  ? HGB 15.4 09/18/2019  ? HCT 45.6 (H) 09/18/2019  ? MCV 89.8 09/18/2019  ? PLT 242 09/18/2019  ? ?Attestation Statements:  ? ?Reviewed by clinician on day of visit: allergies, medications, problem list, medical history, surgical history, family history, social history, and previous encounter notes. ? ?I, Insurance claims handler, CMA, am acting as Energy manager for William Hamburger, NP. ? ?I have reviewed the above documentation for accuracy and completeness, and I agree with the above. -  Treniya Lobb d. Paymon Rosensteel, NP-C ?

## 2021-12-16 ENCOUNTER — Ambulatory Visit
Admission: EM | Admit: 2021-12-16 | Discharge: 2021-12-16 | Disposition: A | Discharge status: Home / Self Care | Payer: 59 | Attending: Urgent Care | Admitting: Urgent Care

## 2021-12-16 DIAGNOSIS — K146 Glossodynia: Secondary | ICD-10-CM | POA: Diagnosis not present

## 2021-12-16 DIAGNOSIS — B370 Candidal stomatitis: Secondary | ICD-10-CM

## 2021-12-16 DIAGNOSIS — R070 Pain in throat: Secondary | ICD-10-CM

## 2021-12-16 LAB — POCT RAPID STREP A (OFFICE): Rapid Strep A Screen: NEGATIVE

## 2021-12-16 MED ORDER — CLOTRIMAZOLE 10 MG MT TROC: 10.0000 mg | Freq: Every day | OROMUCOSAL | 0 refills | Status: AC

## 2021-12-16 NOTE — ED Triage Notes (Signed)
Pt presents with white patches in mouth  that is painful for past few days  ?

## 2021-12-16 NOTE — ED Provider Notes (Signed)
?[facility] ? ? ?MRN: [id number] DOB: October 12, 1955 ? ?Subjective:  ? ?Victoria Page is a 66 y.o. female presenting for recheck on her throat pain.  Patient was last seen 12/10/2021.  She was diagnosed with a viral upper respiratory infection.  She had bilateral ear effusions, throat pain and lesions on the roof of her mouth.  Strep test was negative, strep culture also negative.  COVID and flu tests were done which were negative to.  Patient underwent a steroid course, lidocaine oral rinse, Flonase.  Everything has improved except she feels more tongue soreness, still has some raw sensation and throat discomfort.  No fever, cough, chest pain, shortness of breath. ? ?No current facility-administered medications for this encounter. ? ?Current Outpatient Medications:  ?  ALPRAZolam (XANAX) 0.5 MG tablet, Take 0.5 mg by mouth daily as needed., Disp: , Rfl:  ?  CALCIUM PO, Take by mouth. 1 chewable daily, Disp: , Rfl:  ?  co-enzyme Q-10 30 MG capsule, Take 30 mg by mouth 3 (three) times daily., Disp: , Rfl:  ?  fluticasone (FLONASE) 50 MCG/ACT nasal spray, Place 1 spray into both nostrils 2 (two) times daily., Disp: 16 g, Rfl: 2 ?  hydrochlorothiazide (HYDRODIURIL) 25 MG tablet, Take 25 mg by mouth daily., Disp: , Rfl:  ?  ibuprofen (ADVIL) 600 MG tablet, Take 1 tablet (600 mg total) by mouth every 6 (six) hours as needed., Disp: 30 tablet, Rfl: 0 ?  lidocaine (XYLOCAINE) 2 % solution, Use as directed 10 mLs in the mouth or throat every 3 (three) hours as needed for mouth pain., Disp: 100 mL, Rfl: 0 ?  Multiple Vitamin (MULTIVITAMIN ADULT PO), Take by mouth., Disp: , Rfl:  ?  ondansetron (ZOFRAN-ODT) 4 MG disintegrating tablet, Take 1 tablet (4 mg total) by mouth every 8 (eight) hours as needed for nausea or vomiting., Disp: 20 tablet, Rfl: 0 ?  pantoprazole (PROTONIX) 40 MG tablet, TAKE (1) TABLET BY MOUTH ONCE TO TWICE DAILY., Disp: 60 tablet, Rfl: 2 ?  pantoprazole (PROTONIX) 40 MG tablet, Take 1  tablet (40 mg total) by mouth daily., Disp: 30 tablet, Rfl: 0 ?  Plecanatide (TRULANCE) 3 MG TABS, Take 3 mg by mouth daily., Disp: 30 tablet, Rfl: 2 ?  predniSONE (DELTASONE) 20 MG tablet, Take 2 tablets (40 mg total) by mouth daily with breakfast., Disp: 10 tablet, Rfl: 0  ? ?Allergies  ?Allergen Reactions  ? Shrimp [Shellfish Allergy] Nausea And Vomiting  ? Sulfa Antibiotics   ? ? ?Past Medical History:  ?Diagnosis Date  ? Anxiety   ? Back pain   ? Biliary colic   ? Constipation   ? Gallstones   ? GERD (gastroesophageal reflux disease)   ? High cholesterol   ? High triglycerides   ? History of sinus problem   ? Hypertension   ? IBS (irritable bowel syndrome)   ? Joint pain   ? LLQ pain 08/19/2014  ? Plantar fasciitis   ? SOB (shortness of breath)   ?  ? ?Past Surgical History:  ?Procedure Laterality Date  ? COLONOSCOPY N/A 09/06/2016  ? Procedure: COLONOSCOPY;  Surgeon: Malissa Hippo, MD;  [facility];  Service: Endoscopy;  Laterality: N/A;  8:30  ? FLEXIBLE SIGMOIDOSCOPY    ? ? ?Family History  ?Problem Relation Age of Onset  ? Lung cancer Mother   ? Anxiety disorder Mother   ? Alcoholism Mother   ? Heart attack Father   ? Sudden death Father   ?  Diverticulitis Paternal Grandmother   ? Diverticulitis Brother   ?     died from septic shock  ? COPD Brother   ? Breast cancer Daughter 22  ? Other Brother   ?     1/2 brother-  ? Colon cancer Neg Hx   ? ? ?Social History  ? ?Tobacco Use  ? Smoking status: Never  ?  Passive exposure: Never  ? Smokeless tobacco: Never  ?Vaping Use  ? Vaping Use: Never used  ?Substance Use Topics  ? Alcohol use: Yes  ?  Comment: few drinks a year  ? Drug use: No  ? ? ?ROS ? ? ?Objective:  ? ?Vitals: ?BP (!) 151/91   Pulse 74   Temp 97.8 ?F (36.6 ?C)   Resp 18   SpO2 98%  ? ?Physical Exam ?Constitutional:   ?   General: She is not in acute distress. ?   Appearance: Normal appearance. She is well-developed. She is not ill-appearing, toxic-appearing or diaphoretic.  ?HENT:   ?   Head: Normocephalic and atraumatic.  ?   Nose: Nose normal.  ?   Mouth/Throat:  ?   Mouth: Mucous membranes are moist.  ?   Pharynx: Posterior oropharyngeal erythema present. No oropharyngeal exudate.  ? ?   Comments: No geographic tongue. ?Eyes:  ?   General: No scleral icterus.    ?   Right eye: No discharge.     ?   Left eye: No discharge.  ?   Extraocular Movements: Extraocular movements intact.  ?Cardiovascular:  ?   Rate and Rhythm: Normal rate.  ?Pulmonary:  ?   Effort: Pulmonary effort is normal.  ?Skin: ?   General: Skin is warm and dry.  ?Neurological:  ?   General: No focal deficit present.  ?   Mental Status: She is alert and oriented to person, place, and time.  ?Psychiatric:     ?   Mood and Affect: Mood normal.     ?   Behavior: Behavior normal.  ? ?Results for orders placed or performed during the hospital encounter of 12/16/21 (from the past 24 hour(s))  ?POCT rapid strep A     Status: None  ? Collection Time: 12/16/21  7:58 PM  ?Result Value Ref Range  ? Rapid Strep A Screen Negative Negative  ? ? ?Assessment and Plan :  ? ?PDMP not reviewed this encounter. ? ?1. Oral thrush   ?2. Throat pain   ?3. Tongue pain   ? ?We will defer the strep culture if she has not had 3 total negative strep test.  Recommended starting clotrimazole troches to help with oral/oropharyngeal thrush.  Low suspicion for a vitamin B-12 deficiency as she eats meat regularly.  Otherwise, no signs of an oropharyngeal emergency.  Counseled patient on potential for adverse effects with medications prescribed/recommended today, ER and return-to-clinic precautions discussed, patient verbalized understanding. ? ?  ?Wallis Bamberg, PA-C ?12/17/21 1108 ? ?

## 2021-12-17 ENCOUNTER — Telehealth: Payer: Self-pay | Admitting: Urgent Care

## 2021-12-17 MED ORDER — FLUCONAZOLE 150 MG PO TABS: 150.0000 mg | ORAL_TABLET | ORAL | 0 refills | Status: DC

## 2021-12-17 NOTE — Telephone Encounter (Signed)
Patient called after her visit from yesterday. States that she wanted an antibiotic.  Discussed antibiotic stewardship.  Patient was agreeable.  We will use an oral fluconazole in addition to the clotrimazole troches.  Patient expressed concerns about her blood pressure and ongoing throat pain still.  Recommended that she follow-up tomorrow if she has ongoing symptoms. ?

## 2021-12-26 ENCOUNTER — Other Ambulatory Visit (INDEPENDENT_AMBULATORY_CARE_PROVIDER_SITE_OTHER): Payer: 59

## 2021-12-26 DIAGNOSIS — R399 Unspecified symptoms and signs involving the genitourinary system: Secondary | ICD-10-CM

## 2021-12-26 LAB — POCT URINALYSIS DIPSTICK OB
Glucose, UA: NEGATIVE
Ketones, UA: NEGATIVE
Nitrite, UA: NEGATIVE
POC,PROTEIN,UA: NEGATIVE

## 2021-12-26 NOTE — Progress Notes (Signed)
? ?  NURSE VISIT- UTI SYMPTOMS  ? ?SUBJECTIVE:  ?Victoria Page is a 66 y.o. [id number] female here for UTI symptoms. She is a GYN patient. She reports flank pain bilaterally, hematuria, urinary frequency, urinary hesitancy, urinary retention, and urinary urgency. ? ?OBJECTIVE:  ?There were no vitals taken for this visit.  ?Appears well, in no apparent distress ? ?Results for orders placed or performed in visit on 12/26/21 (from the past 24 hour(s))  ?POC Urinalysis Dipstick OB  ? Collection Time: 12/26/21 10:38 AM  ?Result Value Ref Range  ? Color, UA    ? Clarity, UA    ? Glucose, UA Negative Negative  ? Bilirubin, UA    ? Ketones, UA neg   ? Spec Grav, UA    ? Blood, UA mod   ? pH, UA    ? POC,PROTEIN,UA Negative Negative, Trace, Small (1+), Moderate (2+), Large (3+), 4+  ? Urobilinogen, UA    ? Nitrite, UA neg   ? Leukocytes, UA Large (3+) (A) Negative  ? Appearance    ? Odor    ? ? ?ASSESSMENT: ?GYN patient with UTI symptoms and negative nitrites ? ?PLAN: ?Discussed with Cyril Mourning, AGNP   ?Rx sent by provider today: No ?Urine culture sent ?Call or return to clinic prn if these symptoms worsen or fail to improve as anticipated. ?Follow-up: as needed  ? ?Alexandro Line A Alnita Aybar  ?12/26/2021 ?10:45 AM  ?

## 2021-12-27 LAB — URINALYSIS, ROUTINE W REFLEX MICROSCOPIC
Bilirubin, UA: NEGATIVE
Glucose, UA: NEGATIVE
Ketones, UA: NEGATIVE
Nitrite, UA: NEGATIVE
Protein,UA: NEGATIVE
RBC, UA: NEGATIVE
Specific Gravity, UA: 1.01 (ref 1.005–1.030)
Urobilinogen, Ur: 0.2 mg/dL (ref 0.2–1.0)
pH, UA: 7 (ref 5.0–7.5)

## 2021-12-27 LAB — MICROSCOPIC EXAMINATION
Bacteria, UA: NONE SEEN
Casts: NONE SEEN /lpf
Epithelial Cells (non renal): NONE SEEN /hpf (ref 0–10)

## 2021-12-28 ENCOUNTER — Telehealth: Payer: Self-pay

## 2021-12-28 ENCOUNTER — Other Ambulatory Visit (HOSPITAL_COMMUNITY): Payer: Self-pay | Admitting: Family Medicine

## 2021-12-28 DIAGNOSIS — Z136 Encounter for screening for cardiovascular disorders: Secondary | ICD-10-CM

## 2021-12-28 LAB — URINE CULTURE

## 2021-12-28 NOTE — Telephone Encounter (Signed)
PT CALLED AND STATED THAT SHE SAW HER TEST RESULTS AND WAS WANTING TO KNOW WHEN SHE IS GOING TO GET SOMETHING CALLED IN.  SHE WOULD LIKE FOR SOMEONE TO CALL HER. ?

## 2021-12-28 NOTE — Telephone Encounter (Signed)
Pt aware no growth on urine, no antibiotic needed  ?

## 2022-01-11 ENCOUNTER — Encounter (INDEPENDENT_AMBULATORY_CARE_PROVIDER_SITE_OTHER): Payer: Self-pay | Admitting: Adult Health

## 2022-01-11 ENCOUNTER — Ambulatory Visit (INDEPENDENT_AMBULATORY_CARE_PROVIDER_SITE_OTHER): Payer: Medicare Other | Admitting: Adult Health

## 2022-01-11 VITALS — BP 127/79 | HR 69 | Temp 98.0°F | Ht 65.0 in | Wt 167.0 lb

## 2022-01-11 DIAGNOSIS — E782 Mixed hyperlipidemia: Secondary | ICD-10-CM | POA: Diagnosis not present

## 2022-01-11 DIAGNOSIS — E66811 Obesity, class 1: Secondary | ICD-10-CM

## 2022-01-11 DIAGNOSIS — E8881 Metabolic syndrome: Secondary | ICD-10-CM

## 2022-01-11 DIAGNOSIS — E559 Vitamin D deficiency, unspecified: Secondary | ICD-10-CM

## 2022-01-11 DIAGNOSIS — E6609 Other obesity due to excess calories: Secondary | ICD-10-CM

## 2022-01-11 DIAGNOSIS — Z6827 Body mass index (BMI) 27.0-27.9, adult: Secondary | ICD-10-CM

## 2022-01-11 DIAGNOSIS — E669 Obesity, unspecified: Secondary | ICD-10-CM

## 2022-01-11 DIAGNOSIS — E88819 Insulin resistance, unspecified: Secondary | ICD-10-CM

## 2022-01-11 MED ORDER — VITAMIN D (ERGOCALCIFEROL) 1.25 MG (50000 UNIT) PO CAPS: 50000.0000 [IU] | ORAL_CAPSULE | ORAL | 0 refills | Status: DC

## 2022-01-17 NOTE — Progress Notes (Signed)
? ? ? ?Chief Complaint:  ? ?OBESITY ?Victoria Page is here to discuss her progress with her obesity treatment plan along with follow-up of her obesity related diagnoses. [name] is on the Category 1 Plan and keeping a food journal and adhering to recommended goals of 300-400 calories and 30 grams of protein and states she is following her eating plan approximately 60% of the time. Victoria Page states she is walking 45 minutes 5-6 times per week. ? ?Today's visit was #: 30 ?Starting weight: 175 lbs ?Starting date: 02/18/2020 ?Today's weight: 167 lbs ?Today's date: 01/11/2022 ?Total lbs lost to date: 8 ?Total lbs lost since last in-office visit: 0 ? ?Interim History:  ?Since last office visit Ms. Kreiser has experienced pharyngitis, bil ear pain and thrush.  ?She has completed all medications for acute symptoms.  ?Her new insurance company offers rewards if you achieve 5K  steps per day for 10 days out of each month. ? ?Bioimepdance results: ?Muscle mass +1.8 lbs  ?Adipose Mass -1.6 lbs. ? ?Subjective:  ? ?1. Insulin resistance ?Insulin levels  ?05/19/21: 8.4 ?10/17/21: 8.4 ?She is not on any BG lowering medications. ? ?2. Vitamin D deficiency ?On 10/17/21, Vit D level at 43.7.  ?She is on Ergocalciferol- denies N/V/muscle mass. ? ?3. Mixed hyperlipidemia ?Total/LDL both above goal at last 2 checks.  ?Father passed from MI at age 42.  ?She denies acute cardiac sx's at present. ?She denies tobacco/vape use. ?Her PCP ordered CT cardiac scoring. ? ?Assessment/Plan:  ? ?1. Insulin resistance ?Decrease sugar/CHO intake and increase protein.  ?Monitor labs Q3 months. ? ?2. Vitamin D deficiency ?We will check labs in 2-3 months. We will refill ergocalciferol 50,000 IU, once a week for 1 month with no refills. ? ?-Refill Vitamin D, Ergocalciferol, (DRISDOL) 1.25 MG (50000 UNIT) CAPS capsule; Take 1 capsule (50,000 Units total) by mouth every 7 (seven) days.  Dispense: 8 capsule; Refill: 0 ? ?3. Mixed hyperlipidemia ?Complete CT cardiac  scoring 02/07/2022. ? ?4. Obesity with BMI 27.8 ?Victoria Page is currently in the action stage of change. As such, her goal is to continue with weight loss efforts. She has agreed to the Category 2 Plan.  ? ?Exercise goals: As is. ? Increase Cat 2 meal plan compliance to >80% ?Behavioral modification strategies: increasing lean protein intake, decreasing simple carbohydrates, meal planning and cooking strategies, keeping healthy foods in the home, and planning for success. ? ?Victoria Page has agreed to follow-up with our clinic in 5 weeks. She was informed of the importance of frequent follow-up visits to maximize her success with intensive lifestyle modifications for her multiple health conditions.  ? ?Objective:  ? ?Blood pressure 127/79, pulse 69, temperature 98 ?F (36.7 ?C), height 5\' 5"  (1.651 m), weight 167 lb (75.8 kg), SpO2 97 %. ?Body mass index is 27.79 kg/m?. ? ?General: Cooperative, alert, well developed, in no acute distress. ?HEENT: Conjunctivae and lids unremarkable. ?Cardiovascular: Regular rhythm.  ?Lungs: Normal work of breathing. ?Neurologic: No focal deficits.  ? ?Lab Results  ?Component Value Date  ? CREATININE 0.59 10/17/2021  ? BUN 16 10/17/2021  ? NA 141 10/17/2021  ? K 4.8 10/17/2021  ? CL 101 10/17/2021  ? CO2 25 10/17/2021  ? ?Lab Results  ?Component Value Date  ? ALT 12 10/17/2021  ? AST 19 10/17/2021  ? ALKPHOS 89 10/17/2021  ? BILITOT 0.4 10/17/2021  ? ?Lab Results  ?Component Value Date  ? HGBA1C 5.3 10/17/2021  ? HGBA1C 5.3 05/19/2021  ? HGBA1C 5.4 11/23/2020  ? HGBA1C  5.3 02/18/2020  ? ?Lab Results  ?Component Value Date  ? INSULIN 8.4 10/17/2021  ? INSULIN 8.4 05/19/2021  ? INSULIN 4.8 11/23/2020  ? INSULIN 9.9 07/05/2020  ? INSULIN 11.4 02/18/2020  ? ?Lab Results  ?Component Value Date  ? TSH 1.830 02/18/2020  ? ?Lab Results  ?Component Value Date  ? CHOL 239 (H) 10/17/2021  ? HDL 60 10/17/2021  ? LDLCALC 158 (H) 10/17/2021  ? TRIG 120 10/17/2021  ? CHOLHDL 4.0 10/17/2021  ? ?Lab Results   ?Component Value Date  ? VD25OH 43.7 10/17/2021  ? VD25OH 42.8 05/19/2021  ? VD25OH 40.1 11/23/2020  ? ?Lab Results  ?Component Value Date  ? WBC 5.5 09/18/2019  ? HGB 15.4 09/18/2019  ? HCT 45.6 (H) 09/18/2019  ? MCV 89.8 09/18/2019  ? PLT 242 09/18/2019  ? ?No results found for: IRON, TIBC, FERRITIN ? ?Attestation Statements:  ? ?Reviewed by clinician on day of visit: allergies, medications, problem list, medical history, surgical history, family history, social history, and previous encounter notes. ? ? ?I, Brendell Tyus, RMA, am acting as transcriptionist for William Hamburger, NP. ? ?I have reviewed the above documentation for accuracy and completeness, and I agree with the above. -  Sherise Geerdes d. Valena Ivanov, NP-C ?

## 2022-02-07 ENCOUNTER — Other Ambulatory Visit: Payer: Self-pay | Admitting: Family Medicine

## 2022-02-07 ENCOUNTER — Ambulatory Visit (HOSPITAL_COMMUNITY)
Admission: RE | Admit: 2022-02-07 | Discharge: 2022-02-07 | Disposition: A | Discharge status: Home / Self Care | Payer: Medicare Other | Source: Ambulatory Visit | Attending: Family Medicine | Admitting: Family Medicine

## 2022-02-07 DIAGNOSIS — Z136 Encounter for screening for cardiovascular disorders: Secondary | ICD-10-CM | POA: Insufficient documentation

## 2022-02-08 NOTE — Telephone Encounter (Signed)
Pt was prescribed med at [facility] with Dr. Orene Desanctis Requested Prescriptions  Refused Prescriptions Disp Refills  . pantoprazole (PROTONIX) 40 MG tablet [Pharmacy Med Name: PANTOPRAZOLE 40MG  TABLETS] 90 tablet     Sig: TAKE 1 TABLET(40 MG) BY MOUTH DAILY     There is no refill protocol information for this order

## 2022-02-09 ENCOUNTER — Ambulatory Visit (INDEPENDENT_AMBULATORY_CARE_PROVIDER_SITE_OTHER): Payer: Medicare Other | Admitting: Adult Health

## 2022-02-09 ENCOUNTER — Encounter (INDEPENDENT_AMBULATORY_CARE_PROVIDER_SITE_OTHER): Payer: Self-pay | Admitting: Adult Health

## 2022-02-09 VITALS — BP 128/82 | HR 77 | Temp 97.5°F | Ht 65.0 in | Wt 167.0 lb

## 2022-02-09 DIAGNOSIS — E6609 Other obesity due to excess calories: Secondary | ICD-10-CM

## 2022-02-09 DIAGNOSIS — E669 Obesity, unspecified: Secondary | ICD-10-CM

## 2022-02-09 DIAGNOSIS — Z9189 Other specified personal risk factors, not elsewhere classified: Secondary | ICD-10-CM

## 2022-02-09 DIAGNOSIS — E782 Mixed hyperlipidemia: Secondary | ICD-10-CM | POA: Diagnosis not present

## 2022-02-09 DIAGNOSIS — E559 Vitamin D deficiency, unspecified: Secondary | ICD-10-CM

## 2022-02-10 DIAGNOSIS — J020 Streptococcal pharyngitis: Secondary | ICD-10-CM | POA: Insufficient documentation

## 2022-02-10 DIAGNOSIS — I2510 Atherosclerotic heart disease of native coronary artery without angina pectoris: Secondary | ICD-10-CM | POA: Insufficient documentation

## 2022-02-10 DIAGNOSIS — M7752 Other enthesopathy of left foot: Secondary | ICD-10-CM | POA: Insufficient documentation

## 2022-02-10 LAB — VITAMIN D 25 HYDROXY (VIT D DEFICIENCY, FRACTURES): Vit D, 25-Hydroxy: 53.5 ng/mL (ref 30.0–100.0)

## 2022-02-13 ENCOUNTER — Encounter (INDEPENDENT_AMBULATORY_CARE_PROVIDER_SITE_OTHER): Payer: Self-pay | Admitting: Adult Health

## 2022-02-13 ENCOUNTER — Other Ambulatory Visit (INDEPENDENT_AMBULATORY_CARE_PROVIDER_SITE_OTHER): Payer: Self-pay | Admitting: Adult Health

## 2022-02-13 DIAGNOSIS — E559 Vitamin D deficiency, unspecified: Secondary | ICD-10-CM

## 2022-02-13 MED ORDER — VITAMIN D (ERGOCALCIFEROL) 1.25 MG (50000 UNIT) PO CAPS: 50000.0000 [IU] | ORAL_CAPSULE | ORAL | 0 refills | Status: DC

## 2022-02-13 NOTE — Progress Notes (Signed)
Chief Complaint:   OBESITY Victoria Page is here to discuss her progress with her obesity treatment plan along with follow-up of her obesity related diagnoses. Victoria Page is on the Category 2 Plan and states she is following her eating plan approximately 80% of the time. Victoria Page states she is walking/gym for 35-40 minutes 7 times per week.  Today's visit was #: 31 Starting weight: 175 lbs Starting date: 02/18/2020 Today's weight: 167 lbs Today's date: 02/09/2022 Total lbs lost to date: 8 Total lbs lost since last in-office visit: 0  Interim History:  She recently joined [employer]".   She has been going 2-3 times per week-using treadmill and [place] machines.   CT CARDIAC SCORING completed 02/07/2022  Subjective:   1. Mixed hyperlipidemia Her father passed away from myocardial infarction at age 80.   Multiple family members died from heart disease.   ASCVD risk score stratification-7.9%-above goal.  02/07/2022 CT CARDIAC SCORING: IMPRESSION: Coronary calcium score of 78.3. This was 79th percentile for age-, race-, and sex-matched controls.   RECOMMENDATIONS: Coronary artery calcium (CAC) score is a strong predictor of incident coronary heart disease (CHD) and provides predictive information beyond traditional risk factors. CAC scoring is reasonable to use in the decision to withhold, postpone, or initiate statin therapy in intermediate-risk or selected borderline-risk asymptomatic adults (age 30-75 years and LDL-C >=70 to <190 mg/dL) who do not have diabetes or established atherosclerotic cardiovascular disease (ASCVD).* In intermediate-risk (10-year ASCVD risk >=7.5% to <20%) adults or selected borderline-risk (10-year ASCVD risk >=5% to <7.5%) adults in whom a CAC score is measured for the purpose of making a treatment decision the following recommendations have been made  2. Vitamin D deficiency On 10-17-2021 vitamin D level was 43.7-below goal of 50-70.  3. At risk  for heart disease Victoria Page is at higher than average risk for cardiovascular disease due to uncontrolled HLD, CT eye Icore, significant family history of HLD.  Assessment/Plan:   1. Mixed hyperlipidemia Follow-up with Dr. Hall/PCP-CT CARDIAC SCORING and statin therapy.  2. Vitamin D deficiency We will check labs today, MyChart with results, we will refill ergocalciferol as appropriate.  - VITAMIN D 25 Hydroxy (Vit-D Deficiency, Fractures)  3. At risk for heart disease Victoria Page was given approximately 15 minutes of coronary artery disease prevention counseling today. She is 66 y.o. female and has risk factors for heart disease including obesity. We discussed intensive lifestyle modifications today with an emphasis on specific weight loss instructions and strategies.  Repetitive spaced learning was employed today to elicit superior memory formation and behavioral change.   4. Obesity, current BMI 27.9 Victoria Page is currently in the action stage of change. As such, her goal is to continue with weight loss efforts. She has agreed to the Category 2 Plan.   Exercise goals: As is.   Behavioral modification strategies: increasing lean protein intake, decreasing simple carbohydrates, meal planning and cooking strategies, keeping healthy foods in the home, and planning for success.  Victoria Page has agreed to follow-up with our clinic in 4 to 6 weeks. She was informed of the importance of frequent follow-up visits to maximize her success with intensive lifestyle modifications for her multiple health conditions.   Objective:   Blood pressure 128/82, pulse 77, temperature (!) 97.5 F (36.4 C), height 5\' 5"  (1.651 m), weight 167 lb (75.8 kg), SpO2 96 %. Body mass index is 27.79 kg/m.  General: Cooperative, alert, well developed, in no acute distress. HEENT: Conjunctivae and lids unremarkable. Cardiovascular: Regular rhythm.  Lungs: Normal work of breathing. Neurologic: No focal deficits.   Lab  Results  Component Value Date   CREATININE 0.59 10/17/2021   BUN 16 10/17/2021   NA 141 10/17/2021   K 4.8 10/17/2021   CL 101 10/17/2021   CO2 25 10/17/2021   Lab Results  Component Value Date   ALT 12 10/17/2021   AST 19 10/17/2021   ALKPHOS 89 10/17/2021   BILITOT 0.4 10/17/2021   Lab Results  Component Value Date   HGBA1C 5.3 10/17/2021   HGBA1C 5.3 05/19/2021   HGBA1C 5.4 11/23/2020   HGBA1C 5.3 02/18/2020   Lab Results  Component Value Date   INSULIN 8.4 10/17/2021   INSULIN 8.4 05/19/2021   INSULIN 4.8 11/23/2020   INSULIN 9.9 07/05/2020   INSULIN 11.4 02/18/2020   Lab Results  Component Value Date   TSH 1.830 02/18/2020   Lab Results  Component Value Date   CHOL 239 (H) 10/17/2021   HDL 60 10/17/2021   LDLCALC 158 (H) 10/17/2021   TRIG 120 10/17/2021   CHOLHDL 4.0 10/17/2021   Lab Results  Component Value Date   VD25OH 53.5 02/09/2022   VD25OH 43.7 10/17/2021   VD25OH 42.8 05/19/2021   Lab Results  Component Value Date   WBC 5.5 09/18/2019   HGB 15.4 09/18/2019   HCT 45.6 (H) 09/18/2019   MCV 89.8 09/18/2019   PLT 242 09/18/2019   No results found for: "IRON", "TIBC", "FERRITIN"  Attestation Statements:   Reviewed by clinician on day of visit: allergies, medications, problem list, medical history, surgical history, family history, social history, and previous encounter notes.   Trude Mcburney, am acting as transcriptionist for William Hamburger, NP.  I have reviewed the above documentation for accuracy and completeness, and I agree with the above. -  Tema Alire d. Marley Charlot, NP-C

## 2022-02-22 ENCOUNTER — Ambulatory Visit (INDEPENDENT_AMBULATORY_CARE_PROVIDER_SITE_OTHER): Payer: Medicare Other | Admitting: Podiatry

## 2022-02-22 ENCOUNTER — Ambulatory Visit (INDEPENDENT_AMBULATORY_CARE_PROVIDER_SITE_OTHER): Payer: Medicare Other

## 2022-02-22 ENCOUNTER — Other Ambulatory Visit: Payer: Self-pay | Admitting: Podiatry

## 2022-02-22 DIAGNOSIS — M722 Plantar fascial fibromatosis: Secondary | ICD-10-CM

## 2022-02-22 DIAGNOSIS — M7732 Calcaneal spur, left foot: Secondary | ICD-10-CM

## 2022-02-23 NOTE — Progress Notes (Signed)
Subjective:  Patient ID: Victoria Page, female    DOB: [birth year],  MRN: [id number]  No chief complaint on file.   66 y.o. female presents with the above complaint.  Patient presents with complaint left heel pain is going for 2 months has progressive gotten worse.  Is stabbing pain worse in the morning when taking for step.  She states that she has had this in the past usually went away on her own.  However this 1 is lingering around.  It hurts with ambulation she does not want injection.  She would like to discuss noninvasive therapy.  She has not seen and was prior to seeing me.  Pain scale is 5 out of 10.  Hurts with ambulation   Review of Systems: Negative except as noted in the HPI. Denies N/V/F/Ch.  Past Medical History:  Diagnosis Date   Anxiety    Back pain    Biliary colic    Constipation    Gallstones    GERD (gastroesophageal reflux disease)    High cholesterol    High triglycerides    History of sinus problem    Hypertension    IBS (irritable bowel syndrome)    Joint pain    LLQ pain 08/19/2014   Plantar fasciitis    SOB (shortness of breath)     Current Outpatient Medications:    ALPRAZolam (XANAX) 0.5 MG tablet, Take 0.5 mg by mouth daily as needed., Disp: , Rfl:    CALCIUM PO, Take by mouth. 1 chewable daily, Disp: , Rfl:    co-enzyme Q-10 30 MG capsule, Take 30 mg by mouth 3 (three) times daily., Disp: , Rfl:    fluconazole (DIFLUCAN) 150 MG tablet, Take 1 tablet (150 mg total) by mouth once a week., Disp: 2 tablet, Rfl: 0   fluticasone (FLONASE) 50 MCG/ACT nasal spray, Place 1 spray into both nostrils 2 (two) times daily., Disp: 16 g, Rfl: 2   hydrochlorothiazide (HYDRODIURIL) 25 MG tablet, Take 25 mg by mouth daily., Disp: , Rfl:    ibuprofen (ADVIL) 600 MG tablet, Take 1 tablet (600 mg total) by mouth every 6 (six) hours as needed., Disp: 30 tablet, Rfl: 0   lidocaine (XYLOCAINE) 2 % solution, Use as directed 10 mLs in the mouth or throat every 3 (three)  hours as needed for mouth pain., Disp: 100 mL, Rfl: 0   Multiple Vitamin (MULTIVITAMIN ADULT PO), Take by mouth., Disp: , Rfl:    ondansetron (ZOFRAN-ODT) 4 MG disintegrating tablet, Take 1 tablet (4 mg total) by mouth every 8 (eight) hours as needed for nausea or vomiting., Disp: 20 tablet, Rfl: 0   pantoprazole (PROTONIX) 40 MG tablet, Take 1 tablet (40 mg total) by mouth daily., Disp: 30 tablet, Rfl: 0   Plecanatide (TRULANCE) 3 MG TABS, Take 3 mg by mouth daily., Disp: 30 tablet, Rfl: 2   predniSONE (DELTASONE) 20 MG tablet, Take 2 tablets (40 mg total) by mouth daily with breakfast., Disp: 10 tablet, Rfl: 0   Vitamin D, Ergocalciferol, (DRISDOL) 1.25 MG (50000 UNIT) CAPS capsule, Take 1 capsule (50,000 Units total) by mouth every 7 (seven) days., Disp: 8 capsule, Rfl: 0  Social History   Tobacco Use  Smoking Status Never   Passive exposure: Never  Smokeless Tobacco Never    Allergies  Allergen Reactions   Shrimp [Shellfish Allergy] Nausea And Vomiting   Sulfa Antibiotics    Objective:  There were no vitals filed for this visit. There is no height or  weight on file to calculate BMI. Constitutional Well developed. Well nourished.  Vascular Dorsalis pedis pulses palpable bilaterally. Posterior tibial pulses palpable bilaterally. Capillary refill normal to all digits.  No cyanosis or clubbing noted. Pedal hair growth normal.  Neurologic Normal speech. Oriented to person, place, and time. Epicritic sensation to light touch grossly present bilaterally.  Dermatologic Nails well groomed and normal in appearance. No open wounds. No skin lesions.  Orthopedic: Normal joint ROM without pain or crepitus bilaterally. No visible deformities. Tender to palpation at the calcaneal tuber left. No pain with calcaneal squeeze left. Ankle ROM diminished range of motion left. Silfverskiold Test: positive left.   Radiographs: Taken and reviewed. No acute fractures or dislocations. No  evidence of stress fracture.  Plantar heel spur present. Posterior heel spur present.  Pes planovalgus deformity noted  Assessment:   1. Calcaneal spur of left foot    Plan:  Patient was evaluated and treated and all questions answered.  Plantar Fasciitis, left - XR reviewed as above.  - Educated on icing and stretching. Instructions given.  - I she will hold off on the injection for now.  If there is no improvement we will plan on doing injection. Call visit - DME: Plantar fascial brace dispensed to support the medial longitudinal arch of the foot and offload pressure from the heel and prevent arch collapse during weightbearing - Pharmacologic management: None -Shoe gear modification was discussed   No follow-ups on file.

## 2022-02-24 DIAGNOSIS — R232 Flushing: Secondary | ICD-10-CM | POA: Insufficient documentation

## 2022-02-24 DIAGNOSIS — R070 Pain in throat: Secondary | ICD-10-CM | POA: Insufficient documentation

## 2022-03-16 ENCOUNTER — Encounter (INDEPENDENT_AMBULATORY_CARE_PROVIDER_SITE_OTHER): Payer: Self-pay | Admitting: Adult Health

## 2022-03-16 ENCOUNTER — Ambulatory Visit (INDEPENDENT_AMBULATORY_CARE_PROVIDER_SITE_OTHER): Payer: Medicare Other | Admitting: Adult Health

## 2022-03-16 VITALS — BP 132/78 | HR 68 | Temp 97.8°F | Ht 65.0 in | Wt 170.0 lb

## 2022-03-16 DIAGNOSIS — E559 Vitamin D deficiency, unspecified: Secondary | ICD-10-CM | POA: Diagnosis not present

## 2022-03-16 DIAGNOSIS — E6609 Other obesity due to excess calories: Secondary | ICD-10-CM | POA: Diagnosis not present

## 2022-03-16 DIAGNOSIS — Z6828 Body mass index (BMI) 28.0-28.9, adult: Secondary | ICD-10-CM

## 2022-03-16 DIAGNOSIS — J029 Acute pharyngitis, unspecified: Secondary | ICD-10-CM | POA: Diagnosis not present

## 2022-03-20 NOTE — Progress Notes (Unsigned)
     Chief Complaint:   OBESITY Victoria Page is here to discuss her progress with her obesity treatment plan along with follow-up of her obesity related diagnoses. Victoria Page is on {MWMwtlossportion/plan2:23431} and states she is following her eating plan approximately ***% of the time. Victoria Page states she is *** *** minutes *** times per week.  Today's visit was #: 32 Starting weight: 175 lbs Starting date: 02/18/2020 Today's weight: 170 lbs Today's date: 03/16/2022 Total lbs lost to date: 5 Total lbs lost since last in-office visit: 0  Interim History: ***  Subjective:   1. Pharyngitis, unspecified etiology ***  2. Vitamin D deficiency ***  Assessment/Plan:   1. Pharyngitis, unspecified etiology *** - Ambulatory referral to ENT  2. Vitamin D deficiency ***  3. Obesity, current BMI 28.3 Victoria Page is currently in the action stage of change. As such, her goal is to continue with weight loss efforts. She has agreed to the Category 2 Plan.   Exercise goals: As is.   Behavioral modification strategies: increasing lean protein intake, decreasing simple carbohydrates, meal planning and cooking strategies, keeping healthy foods in the home, and planning for success.  Victoria Page has agreed to follow-up with our clinic in 4 weeks. She was informed of the importance of frequent follow-up visits to maximize her success with intensive lifestyle modifications for her multiple health conditions.   Objective:   Blood pressure 132/78, pulse 68, temperature 97.8 F (36.6 C), height 5\' 5"  (1.651 m), weight 170 lb (77.1 kg), SpO2 98 %. Body mass index is 28.29 kg/m.  General: Cooperative, alert, well developed, in no acute distress. HEENT: Conjunctivae and lids unremarkable. Cardiovascular: Regular rhythm.  Lungs: Normal work of breathing. Neurologic: No focal deficits.   Lab Results  Component Value Date   CREATININE 0.59 10/17/2021   BUN 16 10/17/2021   NA 141 10/17/2021   K 4.8  10/17/2021   CL 101 10/17/2021   CO2 25 10/17/2021   Lab Results  Component Value Date   ALT 12 10/17/2021   AST 19 10/17/2021   ALKPHOS 89 10/17/2021   BILITOT 0.4 10/17/2021   Lab Results  Component Value Date   HGBA1C 5.3 10/17/2021   HGBA1C 5.3 05/19/2021   HGBA1C 5.4 11/23/2020   HGBA1C 5.3 02/18/2020   Lab Results  Component Value Date   INSULIN 8.4 10/17/2021   INSULIN 8.4 05/19/2021   INSULIN 4.8 11/23/2020   INSULIN 9.9 07/05/2020   INSULIN 11.4 02/18/2020   Lab Results  Component Value Date   TSH 1.830 02/18/2020   Lab Results  Component Value Date   CHOL 239 (H) 10/17/2021   HDL 60 10/17/2021   LDLCALC 158 (H) 10/17/2021   TRIG 120 10/17/2021   CHOLHDL 4.0 10/17/2021   Lab Results  Component Value Date   VD25OH 53.5 02/09/2022   VD25OH 43.7 10/17/2021   VD25OH 42.8 05/19/2021   Lab Results  Component Value Date   WBC 5.5 09/18/2019   HGB 15.4 09/18/2019   HCT 45.6 (H) 09/18/2019   MCV 89.8 09/18/2019   PLT 242 09/18/2019   No results found for: "IRON", "TIBC", "FERRITIN"  Attestation Statements:   Reviewed by clinician on day of visit: allergies, medications, problem list, medical history, surgical history, family history, social history, and previous encounter notes.   09/20/2019, am acting as transcriptionist for Trude Mcburney, NP.  I have reviewed the above documentation for accuracy and completeness, and I agree with the above. -  ***

## 2022-03-29 ENCOUNTER — Ambulatory Visit (INDEPENDENT_AMBULATORY_CARE_PROVIDER_SITE_OTHER): Payer: Medicare Other | Admitting: Podiatry

## 2022-03-29 ENCOUNTER — Encounter: Payer: Self-pay | Admitting: Podiatry

## 2022-03-29 DIAGNOSIS — M722 Plantar fascial fibromatosis: Secondary | ICD-10-CM | POA: Diagnosis not present

## 2022-03-29 NOTE — Progress Notes (Signed)
Subjective:  Patient ID: Victoria Page, female    DOB: 08/17/1956,  MRN: [id number]  Chief Complaint  Patient presents with   Plantar Fasciitis    66 y.o. female presents with the above complaint.  Patient presents for follow-up to left fasciitis.  She states she is doing a lot better occasional pain here and there but making shoe changes and wearing the brace has helped considerably.  She also obtain power steps insoles.   Review of Systems: Negative except as noted in the HPI. Denies N/V/F/Ch.  Past Medical History:  Diagnosis Date   Anxiety    Back pain    Biliary colic    Constipation    Gallstones    GERD (gastroesophageal reflux disease)    High cholesterol    High triglycerides    History of sinus problem    Hypertension    IBS (irritable bowel syndrome)    Joint pain    LLQ pain 08/19/2014   Plantar fasciitis    SOB (shortness of breath)     Current Outpatient Medications:    ALPRAZolam (XANAX) 0.5 MG tablet, Take 0.5 mg by mouth daily as needed., Disp: , Rfl:    amoxicillin (AMOXIL) 500 MG capsule, Take 500 mg by mouth every 12 (twelve) hours., Disp: , Rfl:    CALCIUM PO, Take by mouth. 1 chewable daily, Disp: , Rfl:    cetirizine (ZYRTEC) 10 MG tablet, , Disp: , Rfl:    chlorhexidine (PERIDEX) 0.12 % solution, SMARTSIG:0.5 Ounce(s) By Mouth Morning-Night, Disp: , Rfl:    clotrimazole (MYCELEX) 10 MG troche, , Disp: , Rfl:    co-enzyme Q-10 30 MG capsule, Take 30 mg by mouth 3 (three) times daily., Disp: , Rfl:    famotidine (PEPCID) 20 MG tablet, Take 1 tablet twice a day by oral route., Disp: , Rfl:    famotidine (PEPCID) 20 MG tablet, Take 20 mg by mouth 2 (two) times daily., Disp: , Rfl:    fluconazole (DIFLUCAN) 150 MG tablet, Take 1 tablet (150 mg total) by mouth once a week., Disp: 2 tablet, Rfl: 0   fluticasone (FLONASE) 50 MCG/ACT nasal spray, Place 1 spray into both nostrils 2 (two) times daily., Disp: 16 g, Rfl: 2   hydrochlorothiazide  (HYDRODIURIL) 25 MG tablet, Take 25 mg by mouth daily., Disp: , Rfl:    IBU 800 MG tablet, Take 800 mg by mouth 3 (three) times daily., Disp: , Rfl:    ibuprofen (ADVIL) 600 MG tablet, Take 1 tablet (600 mg total) by mouth every 6 (six) hours as needed., Disp: 30 tablet, Rfl: 0   ibuprofen (IBU) 800 MG tablet, , Disp: , Rfl:    lidocaine (XYLOCAINE) 2 % solution, Use as directed 10 mLs in the mouth or throat every 3 (three) hours as needed for mouth pain., Disp: 100 mL, Rfl: 0   linaclotide (LINZESS) 72 MCG capsule, , Disp: , Rfl:    Multiple Vitamin (MULTIVITAMIN ADULT PO), Take by mouth., Disp: , Rfl:    ondansetron (ZOFRAN-ODT) 4 MG disintegrating tablet, Take 1 tablet (4 mg total) by mouth every 8 (eight) hours as needed for nausea or vomiting., Disp: 20 tablet, Rfl: 0   pantoprazole (PROTONIX) 40 MG tablet, Take 1 tablet (40 mg total) by mouth daily., Disp: 30 tablet, Rfl: 0   penicillin v potassium (VEETID) 500 MG tablet, Take 500 mg by mouth 2 (two) times daily., Disp: , Rfl:    penicillin v potassium (VEETID) 500 MG tablet, ,  Disp: , Rfl:    Plecanatide (TRULANCE) 3 MG TABS, Take 3 mg by mouth daily., Disp: 30 tablet, Rfl: 2   predniSONE (DELTASONE) 20 MG tablet, Take 2 tablets (40 mg total) by mouth daily with breakfast., Disp: 10 tablet, Rfl: 0   rosuvastatin (CRESTOR) 5 MG tablet, , Disp: , Rfl:    rosuvastatin (CRESTOR) 5 MG tablet, Take 5 mg by mouth daily., Disp: , Rfl:    Vitamin D, Ergocalciferol, (DRISDOL) 1.25 MG (50000 UNIT) CAPS capsule, Take 1 capsule (50,000 Units total) by mouth every 7 (seven) days., Disp: 8 capsule, Rfl: 0  Social History   Tobacco Use  Smoking Status Never   Passive exposure: Never  Smokeless Tobacco Never    Allergies  Allergen Reactions   Prednisone    Shrimp Extract Allergy Skin Test    Shrimp [Shellfish Allergy] Nausea And Vomiting   Sulfa Antibiotics    Objective:  There were no vitals filed for this visit. There is no height or weight  on file to calculate BMI. Constitutional Well developed. Well nourished.  Vascular Dorsalis pedis pulses palpable bilaterally. Posterior tibial pulses palpable bilaterally. Capillary refill normal to all digits.  No cyanosis or clubbing noted. Pedal hair growth normal.  Neurologic Normal speech. Oriented to person, place, and time. Epicritic sensation to light touch grossly present bilaterally.  Dermatologic Nails well groomed and normal in appearance. No open wounds. No skin lesions.  Orthopedic: Normal joint ROM without pain or crepitus bilaterally. No visible deformities. No further tender to palpation at the calcaneal tuber left. No pain with calcaneal squeeze left. Ankle ROM diminished range of motion left. Silfverskiold Test: positive left.   Radiographs: Taken and reviewed. No acute fractures or dislocations. No evidence of stress fracture.  Plantar heel spur present. Posterior heel spur present.  Pes planovalgus deformity noted  Assessment:   No diagnosis found.  Plan:  Patient was evaluated and treated and all questions answered.  Plantar Fasciitis, left -Clinically the pain has resolved.  She mostly did plantar fascial brace and shoe gear modification.  If her pain returns we will discuss steroid injection at that time.  I discussed shoe gear modification once again in extensive detail as well as power step insoles.   No follow-ups on file.

## 2022-04-12 ENCOUNTER — Encounter (INDEPENDENT_AMBULATORY_CARE_PROVIDER_SITE_OTHER): Payer: Self-pay

## 2022-04-13 ENCOUNTER — Ambulatory Visit (INDEPENDENT_AMBULATORY_CARE_PROVIDER_SITE_OTHER): Payer: Medicare Other | Admitting: Adult Health

## 2022-04-13 ENCOUNTER — Encounter (INDEPENDENT_AMBULATORY_CARE_PROVIDER_SITE_OTHER): Payer: Self-pay | Admitting: Adult Health

## 2022-04-13 VITALS — BP 127/75 | HR 70 | Temp 97.7°F | Ht 65.0 in | Wt 170.0 lb

## 2022-04-13 DIAGNOSIS — E8881 Metabolic syndrome: Secondary | ICD-10-CM

## 2022-04-13 DIAGNOSIS — I10 Essential (primary) hypertension: Secondary | ICD-10-CM

## 2022-04-13 DIAGNOSIS — Z6828 Body mass index (BMI) 28.0-28.9, adult: Secondary | ICD-10-CM | POA: Diagnosis not present

## 2022-04-13 DIAGNOSIS — E6609 Other obesity due to excess calories: Secondary | ICD-10-CM

## 2022-04-13 DIAGNOSIS — E669 Obesity, unspecified: Secondary | ICD-10-CM

## 2022-04-20 NOTE — Progress Notes (Signed)
Chief Complaint:   OBESITY Victoria Page is here to discuss her progress with her obesity treatment plan along with follow-up of her obesity related diagnoses. Victoria Page is on the Category 2 Plan and states she is following her eating plan approximately 80% of the time. Victoria Page states she is walking for 45 minutes 5 times per week.  Today's visit was #: 33 Starting weight: 175 lbs Starting date: 02/18/2020 Today's weight: 170 lbs Today's date: 04/13/2022 Total lbs lost to date: 5 Total lbs lost since last in-office visit: 0  Interim History:  Victoria Page started [facility] on 02/18/2020, weight 175 lbs/BMI 30.04.  Today's weight 170 lbs/BMI 28.8.  Goal weight is 155 lbs with corresponding BMI 25.8 She will consume [employer] every Tuesday when she has her grandchildren, however she is mindful of portion sizes of food.  Subjective:   1. Essential hypertension Victoria Page's blood pressure and heart rate are excellent at her office visit today.  She is on HCTZ 25 mg daily, managed by Dr. Hall/PCP.  2. Insulin resistance Victoria Page's last 2 insulin levels were 8.4 and 8.4.  She has never been on any medications to lower her blood glucose or insulin levels.   Assessment/Plan:   1. Essential hypertension Victoria Page will continue daily diuretic.   2. Insulin resistance Victoria Page will continue increasing protein and limit sugar and carbohydrate intake.   3. Obesity, current BMI 28.4 Victoria Page is currently in the action stage of change. As such, her goal is to continue with weight loss efforts. She has agreed to the Category 2 Plan.   Handout: Eating Out guide and 100-200 calorie snacks sheet.  Exercise goals: As is.   Behavioral modification strategies: increasing lean protein intake, decreasing simple carbohydrates, meal planning and cooking strategies, keeping healthy foods in the home, and planning for success.  Victoria Page has agreed to follow-up with our clinic in 4  weeks. She was informed of the importance of frequent follow-up visits to maximize her success with intensive lifestyle modifications for her multiple health conditions.   Objective:   Blood pressure 127/75, pulse 70, temperature 97.7 F (36.5 C), height 5\' 5"  (1.651 m), weight 170 lb (77.1 kg), SpO2 98 %. Body mass index is 28.29 kg/m.  General: Cooperative, alert, well developed, in no acute distress. HEENT: Conjunctivae and lids unremarkable. Cardiovascular: Regular rhythm.  Lungs: Normal work of breathing. Neurologic: No focal deficits.   Lab Results  Component Value Date   CREATININE 0.59 10/17/2021   BUN 16 10/17/2021   NA 141 10/17/2021   K 4.8 10/17/2021   CL 101 10/17/2021   CO2 25 10/17/2021   Lab Results  Component Value Date   ALT 12 10/17/2021   AST 19 10/17/2021   ALKPHOS 89 10/17/2021   BILITOT 0.4 10/17/2021   Lab Results  Component Value Date   HGBA1C 5.3 10/17/2021   HGBA1C 5.3 05/19/2021   HGBA1C 5.4 11/23/2020   HGBA1C 5.3 02/18/2020   Lab Results  Component Value Date   INSULIN 8.4 10/17/2021   INSULIN 8.4 05/19/2021   INSULIN 4.8 11/23/2020   INSULIN 9.9 07/05/2020   INSULIN 11.4 02/18/2020   Lab Results  Component Value Date   TSH 1.830 02/18/2020   Lab Results  Component Value Date   CHOL 239 (H) 10/17/2021   HDL 60 10/17/2021   LDLCALC 158 (H) 10/17/2021   TRIG 120 10/17/2021   CHOLHDL 4.0 10/17/2021   Lab Results  Component Value Date   VD25OH 53.5 02/09/2022  VD25OH 43.7 10/17/2021   VD25OH 42.8 05/19/2021   Lab Results  Component Value Date   WBC 5.5 09/18/2019   HGB 15.4 09/18/2019   HCT 45.6 (H) 09/18/2019   MCV 89.8 09/18/2019   PLT 242 09/18/2019   No results found for: "IRON", "TIBC", "FERRITIN"  Attestation Statements:   Reviewed by clinician on day of visit: allergies, medications, problem list, medical history, surgical history, family history, social history, and previous encounter notes.  Time spent  on visit including pre-visit chart review and post-visit care and charting was 27 minutes.   Trude Mcburney, am acting as transcriptionist for William Hamburger, NP.  I have reviewed the above documentation for accuracy and completeness, and I agree with the above. -  Abbee Cremeens d. Rushton Early, NP-C

## 2022-05-11 ENCOUNTER — Ambulatory Visit (INDEPENDENT_AMBULATORY_CARE_PROVIDER_SITE_OTHER): Payer: Medicare Other | Admitting: Adult Health

## 2022-05-11 ENCOUNTER — Encounter (INDEPENDENT_AMBULATORY_CARE_PROVIDER_SITE_OTHER): Payer: Self-pay | Admitting: Adult Health

## 2022-05-11 VITALS — BP 140/92 | HR 77 | Temp 98.2°F | Ht 65.0 in | Wt 169.0 lb

## 2022-05-11 DIAGNOSIS — Z6828 Body mass index (BMI) 28.0-28.9, adult: Secondary | ICD-10-CM | POA: Diagnosis not present

## 2022-05-11 DIAGNOSIS — E559 Vitamin D deficiency, unspecified: Secondary | ICD-10-CM | POA: Diagnosis not present

## 2022-05-11 DIAGNOSIS — I10 Essential (primary) hypertension: Secondary | ICD-10-CM

## 2022-05-11 DIAGNOSIS — E6609 Other obesity due to excess calories: Secondary | ICD-10-CM

## 2022-05-11 DIAGNOSIS — E669 Obesity, unspecified: Secondary | ICD-10-CM | POA: Diagnosis not present

## 2022-05-11 MED ORDER — VITAMIN D (ERGOCALCIFEROL) 1.25 MG (50000 UNIT) PO CAPS: 50000.0000 [IU] | ORAL_CAPSULE | ORAL | 0 refills | Status: DC

## 2022-05-14 NOTE — Progress Notes (Signed)
Chief Complaint:   OBESITY Victoria Page is here to discuss her progress with her obesity treatment plan along with follow-up of her obesity related diagnoses. Victoria Page is on the Category 2 Plan and states she is following her eating plan approximately 80% of the time. Victoria Page states she is walking  45 minutes 5 times per week.  Today's visit was #: 34 Starting weight: 175 lbs Starting date: 02/18/2020 Today's weight: 169 lbs Today's date: 05/11/2022 Total lbs lost to date: 6 lbs Total lbs lost since last in-office visit: 1 lb  Interim History:  Her blood pressure elevated at office visit.   She denies acute cardiac symptoms at present.   She believes it is related to exacerbation of left foot plantar fasciatus pain.  Her Fall health goals are to continue with exercise.    Reviewed Bioimpedance  results with pt: Muscle Mass +0.8 muscle mass Adipose Mass -2.2 adipose mass.   Subjective:   1. Essential hypertension Her blood pressure elevated at office visit.   She denies acute cardiac symptoms at present.   She believes it is related to exacerbation of left foot plantar fasciatus pain. Currently only on HCTZ 25 mg.  Previously on  another antihypertensive and HCTZ > 20 years.  She is unsure of name/doseage.    2. Vitamin D deficiency 02/09/2022 Vitamin D level 53.5. She is on weekly Ergocalciferol- tolerating well.  Assessment/Plan:   1. Essential hypertension Check blood pressure and heart rate at home, bring log to next office visit.   2. Vitamin D deficiency Refill - Vitamin D, Ergocalciferol, (DRISDOL) 1.25 MG (50000 UNIT) CAPS capsule; Take 1 capsule (50,000 Units total) by mouth every 7 (seven) days.  Dispense: 8 capsule; Refill: 0  3. Obesity, current BMI 28.2 Victoria Page is currently in the action stage of change. As such, her goal is to continue with weight loss efforts. She has agreed to the Category 2 Plan.   Exercise goals:  as is.  Behavioral  modification strategies: increasing lean protein intake, decreasing simple carbohydrates, meal planning and cooking strategies, keeping healthy foods in the home, and planning for success.  Victoria Page has agreed to follow-up with our clinic in 4 weeks. She was informed of the importance of frequent follow-up visits to maximize her success with intensive lifestyle modifications for her multiple health conditions.   Objective:   Blood pressure (!) 140/92, pulse 77, temperature 98.2 F (36.8 C), height 5\' 5"  (1.651 m), weight 169 lb (76.7 kg), SpO2 97 %. Body mass index is 28.12 kg/m.  General: Cooperative, alert, well developed, in no acute distress. HEENT: Conjunctivae and lids unremarkable. Cardiovascular: Regular rhythm.  Lungs: Normal work of breathing. Neurologic: No focal deficits.   Lab Results  Component Value Date   CREATININE 0.59 10/17/2021   BUN 16 10/17/2021   NA 141 10/17/2021   K 4.8 10/17/2021   CL 101 10/17/2021   CO2 25 10/17/2021   Lab Results  Component Value Date   ALT 12 10/17/2021   AST 19 10/17/2021   ALKPHOS 89 10/17/2021   BILITOT 0.4 10/17/2021   Lab Results  Component Value Date   HGBA1C 5.3 10/17/2021   HGBA1C 5.3 05/19/2021   HGBA1C 5.4 11/23/2020   HGBA1C 5.3 02/18/2020   Lab Results  Component Value Date   INSULIN 8.4 10/17/2021   INSULIN 8.4 05/19/2021   INSULIN 4.8 11/23/2020   INSULIN 9.9 07/05/2020   INSULIN 11.4 02/18/2020   Lab Results  Component Value Date  TSH 1.830 02/18/2020   Lab Results  Component Value Date   CHOL 239 (H) 10/17/2021   HDL 60 10/17/2021   LDLCALC 158 (H) 10/17/2021   TRIG 120 10/17/2021   CHOLHDL 4.0 10/17/2021   Lab Results  Component Value Date   VD25OH 53.5 02/09/2022   VD25OH 43.7 10/17/2021   VD25OH 42.8 05/19/2021   Lab Results  Component Value Date   WBC 5.5 09/18/2019   HGB 15.4 09/18/2019   HCT 45.6 (H) 09/18/2019   MCV 89.8 09/18/2019   PLT 242 09/18/2019   No results found  for: "IRON", "TIBC", "FERRITIN"  Attestation Statements:   Reviewed by clinician on day of visit: allergies, medications, problem list, medical history, surgical history, family history, social history, and previous encounter notes.  I, Malcolm Metro, RMA, am acting as Energy manager for William Hamburger, NP.  I have reviewed the above documentation for accuracy and completeness, and I agree with the above. -  Ashland Wiseman d. Holston Oyama, NP-C

## 2022-05-16 ENCOUNTER — Other Ambulatory Visit: Payer: Self-pay | Admitting: Gastroenterology

## 2022-06-12 ENCOUNTER — Ambulatory Visit (INDEPENDENT_AMBULATORY_CARE_PROVIDER_SITE_OTHER): Payer: Medicaid Other | Admitting: Adult Health

## 2022-06-19 ENCOUNTER — Ambulatory Visit (INDEPENDENT_AMBULATORY_CARE_PROVIDER_SITE_OTHER): Payer: Medicare Other | Admitting: Family Medicine

## 2022-06-19 ENCOUNTER — Encounter (INDEPENDENT_AMBULATORY_CARE_PROVIDER_SITE_OTHER): Payer: Self-pay | Admitting: Family Medicine

## 2022-06-19 VITALS — BP 136/84 | Ht 65.0 in | Wt 169.0 lb

## 2022-06-19 DIAGNOSIS — E88819 Insulin resistance, unspecified: Secondary | ICD-10-CM

## 2022-06-19 DIAGNOSIS — E6609 Other obesity due to excess calories: Secondary | ICD-10-CM

## 2022-06-19 DIAGNOSIS — E559 Vitamin D deficiency, unspecified: Secondary | ICD-10-CM | POA: Diagnosis not present

## 2022-06-19 DIAGNOSIS — I10 Essential (primary) hypertension: Secondary | ICD-10-CM | POA: Diagnosis not present

## 2022-06-19 DIAGNOSIS — E669 Obesity, unspecified: Secondary | ICD-10-CM | POA: Diagnosis not present

## 2022-06-19 DIAGNOSIS — Z6828 Body mass index (BMI) 28.0-28.9, adult: Secondary | ICD-10-CM

## 2022-06-19 MED ORDER — VITAMIN D (ERGOCALCIFEROL) 1.25 MG (50000 UNIT) PO CAPS: 50000.0000 [IU] | ORAL_CAPSULE | ORAL | 0 refills | Status: DC

## 2022-07-07 DIAGNOSIS — Z13820 Encounter for screening for osteoporosis: Secondary | ICD-10-CM | POA: Diagnosis not present

## 2022-07-07 DIAGNOSIS — I10 Essential (primary) hypertension: Secondary | ICD-10-CM | POA: Diagnosis not present

## 2022-07-11 NOTE — Progress Notes (Signed)
Chief Complaint:   Victoria Page is here to discuss her progress with her obesity treatment plan along with follow-up of her obesity related diagnoses. Victoria Page is on the Category 2 Plan and states she is following her eating plan approximately 85% of the time. Victoria Page states she is not currently exercising.  Today's visit was #: 29 Starting weight: 175 lbs Starting date: 02/18/2020 Today's weight: 169 lbs Today's date: 06/19/2022 Total lbs lost to date: 6 Total lbs lost since last in-office visit: 0  Interim History: Victoria Page's last Ov with me was in 2021.  Her appetite was well controlled the last couple of weeks, which pt thinks is from CBD oil. She is not eating all foods on plan. The last 4 days, she worked 12 hour shifts at [employer] in [place]. Pt skipped lunch each day. Breakfast: protein Kodiac oatmeal with spray butter only (pre-packed) Lunch: skips Dinner: 4 oz chicken with salad or stir fry veggies  Subjective:   1. Essential hypertension Pt's BP was elevated at last OV at 140/92 and again today. At home, her BP runs 120's/85 or less. Pt checks BP 1-2 times a week. She is only on HCTZ.  2. Insulin resistance Victoria Page denies hunger or cravings. Appetite is well controlled. She is not on meds currently and has no concerns.  3. Vitamin D deficiency Her last bone density scan was 1 year ago and showed osteopenia. Her Vit D level 4 months ago was 53.5.  Assessment/Plan:  No orders of the defined types were placed in this encounter.   Medications Discontinued During This Encounter  Medication Reason   Vitamin D, Ergocalciferol, (DRISDOL) 1.25 MG (50000 UNIT) CAPS capsule Reorder     Meds ordered this encounter  Medications   Vitamin D, Ergocalciferol, (DRISDOL) 1.25 MG (50000 UNIT) CAPS capsule    Sig: Take 1 capsule (50,000 Units total) by mouth every 7 (seven) days.    Dispense:  8 capsule    Refill:  0     1. Essential hypertension BP high  normal today. Continue HCTZ, prudent nutritional plan, and weight loss. Decrease salt intake and try to exercise.  2. Insulin resistance Glynda will continue to work on weight loss, exercise, and decreasing simple carbohydrates to help decrease the risk of diabetes. Victoria Page agreed to follow-up with [place] as directed to closely monitor her progress.  3. Vitamin D deficiency Start walking again. Discussed with pt adequate calcium intake. Low Vitamin D level contributes to fatigue and are associated with obesity, breast, and colon cancer. She agrees to continue to take prescription Vitamin D @50 ,000 IU every week and will follow-up for routine testing of Vitamin D, at least 2-3 times per year to avoid over-replacement. Every 2 year bone density exams.  Refill- Vitamin D, Ergocalciferol, (DRISDOL) 1.25 MG (50000 UNIT) CAPS capsule; Take 1 capsule (50,000 Units total) by mouth every 7 (seven) days.  Dispense: 8 capsule; Refill: 0  4. Obesity, current BMI 28.2 Victoria Page is currently in the action stage of change. As such, her goal is to continue with weight loss efforts. She has agreed to the Category 2 Plan.   Exercise goals: All adults should avoid inactivity. Some physical activity is better than none, and adults who participate in any amount of physical activity gain some health benefits.  Behavioral modification strategies: increasing lean protein intake, decreasing eating out, and no skipping meals. Try to get proteins in each day. Don't skip meals.  Victoria Page has agreed to follow-up with our  clinic in 5-6 weeks. She was informed of the importance of frequent follow-up visits to maximize her success with intensive lifestyle modifications for her multiple health conditions.   Objective:   Blood pressure 136/84, height 5\' 5"  (1.651 m), weight 169 lb (76.7 kg). Body mass index is 28.12 kg/m.  General: Cooperative, alert, well developed, in no acute distress. HEENT: Conjunctivae and lids  unremarkable. Cardiovascular: Regular rhythm.  Lungs: Normal work of breathing. Neurologic: No focal deficits.   Lab Results  Component Value Date   CREATININE 0.59 10/17/2021   BUN 16 10/17/2021   NA 141 10/17/2021   K 4.8 10/17/2021   CL 101 10/17/2021   CO2 25 10/17/2021   Lab Results  Component Value Date   ALT 12 10/17/2021   AST 19 10/17/2021   ALKPHOS 89 10/17/2021   BILITOT 0.4 10/17/2021   Lab Results  Component Value Date   HGBA1C 5.3 10/17/2021   HGBA1C 5.3 05/19/2021   HGBA1C 5.4 11/23/2020   HGBA1C 5.3 02/18/2020   Lab Results  Component Value Date   INSULIN 8.4 10/17/2021   INSULIN 8.4 05/19/2021   INSULIN 4.8 11/23/2020   INSULIN 9.9 07/05/2020   INSULIN 11.4 02/18/2020   Lab Results  Component Value Date   TSH 1.830 02/18/2020   Lab Results  Component Value Date   CHOL 239 (H) 10/17/2021   HDL 60 10/17/2021   LDLCALC 158 (H) 10/17/2021   TRIG 120 10/17/2021   CHOLHDL 4.0 10/17/2021   Lab Results  Component Value Date   VD25OH 53.5 02/09/2022   VD25OH 43.7 10/17/2021   VD25OH 42.8 05/19/2021   Lab Results  Component Value Date   WBC 5.5 09/18/2019   HGB 15.4 09/18/2019   HCT 45.6 (H) 09/18/2019   MCV 89.8 09/18/2019   PLT 242 09/18/2019     Attestation Statements:   Reviewed by clinician on day of visit: allergies, medications, problem list, medical history, surgical history, family history, social history, and previous encounter notes.  I, 09/20/2019, BS, CMA, am acting as transcriptionist for Kyung Rudd, DO.   I have reviewed the above documentation for accuracy and completeness, and I agree with the above. [employer], D.O.  The 21st Century Cures Act was signed into law in 2016 which includes the topic of electronic health records.  This provides immediate access to information in MyChart.  This includes consultation notes, operative notes, office notes, lab results and pathology reports.  If you have any  questions about what you read please let 2017 know at your next visit so we can discuss your concerns and take corrective action if need be.  We are right here with you.

## 2022-07-12 ENCOUNTER — Other Ambulatory Visit (HOSPITAL_COMMUNITY): Payer: Self-pay | Admitting: Family Medicine

## 2022-07-12 ENCOUNTER — Telehealth: Payer: Self-pay | Admitting: *Deleted

## 2022-07-12 DIAGNOSIS — Z13820 Encounter for screening for osteoporosis: Secondary | ICD-10-CM | POA: Diagnosis not present

## 2022-07-12 DIAGNOSIS — M722 Plantar fascial fibromatosis: Secondary | ICD-10-CM | POA: Diagnosis not present

## 2022-07-12 DIAGNOSIS — Z1231 Encounter for screening mammogram for malignant neoplasm of breast: Secondary | ICD-10-CM | POA: Diagnosis not present

## 2022-07-12 DIAGNOSIS — Z2820 Immunization not carried out because of patient decision for unspecified reason: Secondary | ICD-10-CM | POA: Diagnosis not present

## 2022-07-12 DIAGNOSIS — K581 Irritable bowel syndrome with constipation: Secondary | ICD-10-CM | POA: Diagnosis not present

## 2022-07-12 DIAGNOSIS — K219 Gastro-esophageal reflux disease without esophagitis: Secondary | ICD-10-CM | POA: Diagnosis not present

## 2022-07-12 DIAGNOSIS — I10 Essential (primary) hypertension: Secondary | ICD-10-CM | POA: Diagnosis not present

## 2022-07-12 DIAGNOSIS — E785 Hyperlipidemia, unspecified: Secondary | ICD-10-CM | POA: Diagnosis not present

## 2022-07-12 DIAGNOSIS — M6530 Trigger finger, unspecified finger: Secondary | ICD-10-CM | POA: Diagnosis not present

## 2022-07-12 NOTE — Telephone Encounter (Signed)
Patient is requesting another PF brace to wear at night. Called her and left message that we do sell them at office and to let us know, may purchase one anytime.

## 2022-07-13 NOTE — Telephone Encounter (Signed)
Patient called and has an old brace at home  is undecided about having an injection  for the PF will call back to schedule later.

## 2022-08-01 ENCOUNTER — Other Ambulatory Visit: Payer: Self-pay | Admitting: Internal Medicine

## 2022-08-01 DIAGNOSIS — Z1231 Encounter for screening mammogram for malignant neoplasm of breast: Secondary | ICD-10-CM

## 2022-08-09 ENCOUNTER — Ambulatory Visit (INDEPENDENT_AMBULATORY_CARE_PROVIDER_SITE_OTHER): Payer: Medicaid Other | Admitting: Physician Assistant

## 2022-08-16 ENCOUNTER — Ambulatory Visit (INDEPENDENT_AMBULATORY_CARE_PROVIDER_SITE_OTHER): Payer: Medicare Other | Admitting: Physician Assistant

## 2022-08-16 ENCOUNTER — Encounter: Payer: Self-pay | Admitting: Adult Health

## 2022-08-16 ENCOUNTER — Ambulatory Visit (INDEPENDENT_AMBULATORY_CARE_PROVIDER_SITE_OTHER): Payer: Medicare Other | Admitting: Adult Health

## 2022-08-16 ENCOUNTER — Encounter (INDEPENDENT_AMBULATORY_CARE_PROVIDER_SITE_OTHER): Payer: Self-pay | Admitting: Physician Assistant

## 2022-08-16 VITALS — BP 114/79 | HR 77 | Ht 63.0 in | Wt 173.0 lb

## 2022-08-16 VITALS — BP 130/85 | HR 72 | Temp 98.2°F | Ht 65.0 in | Wt 167.0 lb

## 2022-08-16 DIAGNOSIS — I10 Essential (primary) hypertension: Secondary | ICD-10-CM | POA: Diagnosis not present

## 2022-08-16 DIAGNOSIS — Z6827 Body mass index (BMI) 27.0-27.9, adult: Secondary | ICD-10-CM

## 2022-08-16 DIAGNOSIS — E669 Obesity, unspecified: Secondary | ICD-10-CM | POA: Diagnosis not present

## 2022-08-16 DIAGNOSIS — E6609 Other obesity due to excess calories: Secondary | ICD-10-CM

## 2022-08-16 DIAGNOSIS — E88819 Insulin resistance, unspecified: Secondary | ICD-10-CM | POA: Diagnosis not present

## 2022-08-16 DIAGNOSIS — N8111 Cystocele, midline: Secondary | ICD-10-CM | POA: Diagnosis not present

## 2022-08-16 DIAGNOSIS — N816 Rectocele: Secondary | ICD-10-CM | POA: Diagnosis not present

## 2022-08-16 DIAGNOSIS — E559 Vitamin D deficiency, unspecified: Secondary | ICD-10-CM

## 2022-08-16 DIAGNOSIS — R102 Pelvic and perineal pain: Secondary | ICD-10-CM | POA: Insufficient documentation

## 2022-08-16 MED ORDER — VITAMIN D (ERGOCALCIFEROL) 1.25 MG (50000 UNIT) PO CAPS: 50000.0000 [IU] | ORAL_CAPSULE | ORAL | 0 refills | Status: DC

## 2022-08-16 NOTE — Progress Notes (Signed)
  Subjective:     Patient ID: Victoria Page, female   DOB: 06-15-1956, 66 y.o.   MRN: [id number]  HPI Jacquese is a 66 year old white female, divorced, PM in complaining of pelvic pressure and feels like something wants to fall out, had rectal pressure too. She picked up 27 lbs grand child.  Last pap was negative for HPV and malignancy 05/17/20.  PCP is Dr Margo Aye.  Review of Systems +pelvic pressure and feels like something falling out Rectal pressure too Reviewed past medical,surgical, social and family history. Reviewed medications and allergies.     Objective:   Physical Exam Skin warm and dry.Pelvic: external genitalia is normal in appearance no lesions, vagina: white discharge with odor,urethra has no lesions or masses noted, cervix:smooth and bulbous, uterus: normal size, shape and contour, non tender, no masses felt, adnexa: no masses or tenderness noted. Bladder is non tender and no masses felt. On rectal exam has +rectocele, no masses     Assessment:     1. Pelvic relaxation due to cystocele, midline Will follow  for now  2. Rectocele Try to not get constipated Given medical explainer #4  3. Pelvic pressure in female Limit lifting       Plan:     Follow up prn

## 2022-08-30 DIAGNOSIS — U071 COVID-19: Secondary | ICD-10-CM | POA: Diagnosis not present

## 2022-08-31 NOTE — Progress Notes (Signed)
Chief Complaint:   OBESITY Victoria Page is here to discuss her progress with her obesity treatment plan along with follow-up of her obesity related diagnoses. Victoria Page is on the Category 2 Plan and states she is following her eating plan approximately 80% of the time. Victoria Page states she is exercising 0 minutes 0 times per week.  Today's visit was #: 36 Starting weight: 175 lbs Starting date: 02/18/2020 Today's weight: 167 lbs Today's date: 08/16/2022 Total lbs lost to date: 8 lbs Total lbs lost since last in-office visit: 3  Interim History: Victoria Page has done well with weight loss. Did well with portion control at Thanksgiving. Has been having problems with walking due to plantar fasciitis recently--but this has improved and she plans to resume walking regularly.  Subjective:   1. Vitamin D deficiency Victoria Page is currently taking prescription ergocalciferol once a week. Last level of 53.5 on 02/09/22--at goal.  2. Insulin resistance A1c at 5.3/insulin 8.4 on 10/17/21--at goal. No medications. Working on decreasing simple carbs, adhering to healthy eating plan and exercise to promote weight loss.   3. Essential hypertension Victoria Page's blood pressure better today--near goal. Taking HCTZ 25 mg daily. Denies any side effects. Trying to minimize sodium in diet. Working in healthy eating plan/exercise to promote weight loss.  Assessment/Plan:   1. Vitamin D deficiency We will refill Vit D 50K IU once a week for 2 months with 0 refills.  -Refill Vitamin D, Ergocalciferol, (DRISDOL) 1.25 MG (50000 UNIT) CAPS capsule; Take 1 capsule (50,000 Units total) by mouth every 7 (seven) days. (Patient not taking: Reported on 08/16/2022)  Dispense: 8 capsule; Refill: 0  2. Insulin resistance Continue prescribed nutrition plan to decrease simple carbohydrates, decrease saturated fat, increase lean proteins and exercise to promote weight loss.  3. Essential hypertension Continue HCTZ--Continue  healthy eating plan and exercise to promote weight loss.   4. Obesity, current BMI 27.9 Victoria Page is currently in the action stage of change. As such, her goal is to continue with weight loss efforts. She has agreed to the Category 2 Plan.   Exercise goals: Older adults should follow the adult guidelines. When older adults cannot meet the adult guidelines, they should be as physically active as their abilities and conditions will allow.   Discussed adding chair yoga especially if having increased pain with walking with plantar fasciitis.   Behavioral modification strategies: increasing lean protein intake, decreasing simple carbohydrates, decreasing sodium intake, meal planning and cooking strategies, and holiday eating strategies .  Victoria Page has agreed to follow-up with our clinic in 6 weeks. She was informed of the importance of frequent follow-up visits to maximize her success with intensive lifestyle modifications for her multiple health conditions.   Objective:   Blood pressure 130/85, pulse 72, temperature 98.2 F (36.8 C), height 5\' 5"  (1.651 m), weight 167 lb (75.8 kg), SpO2 99 %. Body mass index is 27.79 kg/m.  General: Cooperative, alert, well developed, in no acute distress. HEENT: Conjunctivae and lids unremarkable. Cardiovascular: Regular rhythm.  Lungs: Normal work of breathing. Neurologic: No focal deficits.   Lab Results  Component Value Date   CREATININE 0.59 10/17/2021   BUN 16 10/17/2021   NA 141 10/17/2021   K 4.8 10/17/2021   CL 101 10/17/2021   CO2 25 10/17/2021   Lab Results  Component Value Date   ALT 12 10/17/2021   AST 19 10/17/2021   ALKPHOS 89 10/17/2021   BILITOT 0.4 10/17/2021   Lab Results  Component Value Date  HGBA1C 5.3 10/17/2021   HGBA1C 5.3 05/19/2021   HGBA1C 5.4 11/23/2020   HGBA1C 5.3 02/18/2020   Lab Results  Component Value Date   INSULIN 8.4 10/17/2021   INSULIN 8.4 05/19/2021   INSULIN 4.8 11/23/2020   INSULIN 9.9  07/05/2020   INSULIN 11.4 02/18/2020   Lab Results  Component Value Date   TSH 1.830 02/18/2020   Lab Results  Component Value Date   CHOL 239 (H) 10/17/2021   HDL 60 10/17/2021   LDLCALC 158 (H) 10/17/2021   TRIG 120 10/17/2021   CHOLHDL 4.0 10/17/2021   Lab Results  Component Value Date   VD25OH 53.5 02/09/2022   VD25OH 43.7 10/17/2021   VD25OH 42.8 05/19/2021   Lab Results  Component Value Date   WBC 5.5 09/18/2019   HGB 15.4 09/18/2019   HCT 45.6 (H) 09/18/2019   MCV 89.8 09/18/2019   PLT 242 09/18/2019   No results found for: "IRON", "TIBC", "FERRITIN"  Attestation Statements:   Reviewed by clinician on day of visit: allergies, medications, problem list, medical history, surgical history, family history, social history, and previous encounter notes.  I, Brendell Tyus, am acting as transcriptionist for [employer], PA.  I have reviewed the above documentation for accuracy and completeness, and I agree with the above. -  Yogi Arther,PA-C

## 2022-09-11 ENCOUNTER — Other Ambulatory Visit (HOSPITAL_COMMUNITY): Payer: Medicare Other

## 2022-09-11 ENCOUNTER — Ambulatory Visit (HOSPITAL_COMMUNITY): Payer: Medicare Other

## 2022-09-12 ENCOUNTER — Ambulatory Visit
Admission: RE | Admit: 2022-09-12 | Discharge: 2022-09-12 | Disposition: A | Discharge status: Home / Self Care | Payer: No Typology Code available for payment source | Source: Ambulatory Visit | Attending: Internal Medicine | Admitting: Internal Medicine

## 2022-09-12 ENCOUNTER — Ambulatory Visit (INDEPENDENT_AMBULATORY_CARE_PROVIDER_SITE_OTHER): Payer: No Typology Code available for payment source | Admitting: Adult Health

## 2022-09-12 ENCOUNTER — Encounter (INDEPENDENT_AMBULATORY_CARE_PROVIDER_SITE_OTHER): Payer: Self-pay | Admitting: Adult Health

## 2022-09-12 VITALS — BP 115/78 | HR 65 | Temp 97.7°F | Ht 65.0 in | Wt 172.0 lb

## 2022-09-12 DIAGNOSIS — Z6828 Body mass index (BMI) 28.0-28.9, adult: Secondary | ICD-10-CM | POA: Diagnosis not present

## 2022-09-12 DIAGNOSIS — E669 Obesity, unspecified: Secondary | ICD-10-CM

## 2022-09-12 DIAGNOSIS — Z1231 Encounter for screening mammogram for malignant neoplasm of breast: Secondary | ICD-10-CM | POA: Diagnosis not present

## 2022-09-12 DIAGNOSIS — E6609 Other obesity due to excess calories: Secondary | ICD-10-CM

## 2022-09-12 DIAGNOSIS — K581 Irritable bowel syndrome with constipation: Secondary | ICD-10-CM

## 2022-09-13 ENCOUNTER — Ambulatory Visit (INDEPENDENT_AMBULATORY_CARE_PROVIDER_SITE_OTHER): Payer: No Typology Code available for payment source | Admitting: Adult Health

## 2022-09-18 NOTE — Progress Notes (Signed)
Chief Complaint:   Victoria Page is here to discuss her progress with her obesity treatment plan along with follow-up of her obesity related diagnoses. Victoria Page is on the Category 2 Plan and states she is following her eating plan approximately 50% of the time. Victoria Page states she is not exercising.   Today's visit was #: 91 Starting weight: 175 LBS Starting date: 02/18/2020 Today's weight: 172 LBS Today's date: 09/12/2022 Total lbs lost to date: 3 LBS Total lbs lost since last in-office visit: +5 LBS  Interim History:  COVID-19 infection on 08/28/2022 For 2 days she experienced profound fatigue, body aches, HA, and pharyngitis. She did not experience fever or cough. She experienced altered taste during COVID-19 infection and lost some of the weight that she gained over the holidays. Ultimately she is up 5 lbs since last OV on 08/16/22.  Subjective:   1. Irritable bowel syndrome with constipation Per patient irritable syndrome with constipation worsened since recent COVID-19 infection on 08/28/2022.   She estimates use Linzess 72 mcg 2 times per week. She denies hematochezia.  Assessment/Plan:   1. Irritable bowel syndrome with constipation Increase water intake.   Eat small easily digestible meals.   Continue Linzess as directed.  2. Obesity, current BMI 28.7 Victoria Page is currently in the action stage of change. As such, her goal is to continue with weight loss efforts. She has agreed to the Category 2 Plan.   Exercise goals:  Increase activity as tolerated.  Behavioral modification strategies: increasing lean protein intake, decreasing simple carbohydrates, meal planning and cooking strategies, keeping healthy foods in the home, and planning for success.  Victoria Page has agreed to follow-up with our clinic in 4 weeks. She was informed of the importance of frequent follow-up visits to maximize her success with intensive lifestyle modifications for her multiple health  conditions.   Objective:   Blood pressure 115/78, pulse 65, temperature 97.7 F (36.5 C), height 5\' 5"  (1.651 m), weight 172 lb (78 kg), SpO2 99 %. Body mass index is 28.62 kg/m.  General: Cooperative, alert, well developed, in no acute distress. HEENT: Conjunctivae and lids unremarkable. Cardiovascular: Regular rhythm.  Lungs: Normal work of breathing. Neurologic: No focal deficits.   Lab Results  Component Value Date   CREATININE 0.59 10/17/2021   BUN 16 10/17/2021   NA 141 10/17/2021   K 4.8 10/17/2021   CL 101 10/17/2021   CO2 25 10/17/2021   Lab Results  Component Value Date   ALT 12 10/17/2021   AST 19 10/17/2021   ALKPHOS 89 10/17/2021   BILITOT 0.4 10/17/2021   Lab Results  Component Value Date   HGBA1C 5.3 10/17/2021   HGBA1C 5.3 05/19/2021   HGBA1C 5.4 11/23/2020   HGBA1C 5.3 02/18/2020   Lab Results  Component Value Date   INSULIN 8.4 10/17/2021   INSULIN 8.4 05/19/2021   INSULIN 4.8 11/23/2020   INSULIN 9.9 07/05/2020   INSULIN 11.4 02/18/2020   Lab Results  Component Value Date   TSH 1.830 02/18/2020   Lab Results  Component Value Date   CHOL 239 (H) 10/17/2021   HDL 60 10/17/2021   LDLCALC 158 (H) 10/17/2021   TRIG 120 10/17/2021   CHOLHDL 4.0 10/17/2021   Lab Results  Component Value Date   VD25OH 53.5 02/09/2022   VD25OH 43.7 10/17/2021   VD25OH 42.8 05/19/2021   Lab Results  Component Value Date   WBC 5.5 09/18/2019   HGB 15.4 09/18/2019   HCT 45.6 (H)  09/18/2019   MCV 89.8 09/18/2019   PLT 242 09/18/2019   No results found for: "IRON", "TIBC", "FERRITIN"  Attestation Statements:   Reviewed by clinician on day of visit: allergies, medications, problem list, medical history, surgical history, family history, social history, and previous encounter notes.  I, Davy Pique, RMA, am acting as Location manager for Mina Marble, NP.  I have reviewed the above documentation for accuracy and completeness, and I agree with the  above. -  Rejeana Fadness d. Honesty Menta, NP-C

## 2022-09-19 ENCOUNTER — Other Ambulatory Visit: Payer: Self-pay | Admitting: Gastroenterology

## 2022-09-20 ENCOUNTER — Ambulatory Visit: Payer: No Typology Code available for payment source | Admitting: Podiatry

## 2022-09-22 ENCOUNTER — Other Ambulatory Visit (HOSPITAL_COMMUNITY): Payer: Self-pay

## 2022-09-27 ENCOUNTER — Ambulatory Visit: Payer: Medicare Other

## 2022-09-28 DIAGNOSIS — H5203 Hypermetropia, bilateral: Secondary | ICD-10-CM | POA: Diagnosis not present

## 2022-10-02 ENCOUNTER — Ambulatory Visit (HOSPITAL_COMMUNITY)
Admission: RE | Admit: 2022-10-02 | Discharge: 2022-10-02 | Disposition: A | Discharge status: Home / Self Care | Payer: No Typology Code available for payment source | Source: Ambulatory Visit | Attending: Family Medicine | Admitting: Family Medicine

## 2022-10-02 DIAGNOSIS — Z780 Asymptomatic menopausal state: Secondary | ICD-10-CM | POA: Insufficient documentation

## 2022-10-02 DIAGNOSIS — E559 Vitamin D deficiency, unspecified: Secondary | ICD-10-CM | POA: Diagnosis not present

## 2022-10-02 DIAGNOSIS — M8589 Other specified disorders of bone density and structure, multiple sites: Secondary | ICD-10-CM | POA: Insufficient documentation

## 2022-10-02 DIAGNOSIS — M810 Age-related osteoporosis without current pathological fracture: Secondary | ICD-10-CM | POA: Diagnosis not present

## 2022-10-02 DIAGNOSIS — Z13820 Encounter for screening for osteoporosis: Secondary | ICD-10-CM | POA: Diagnosis not present

## 2022-10-05 DIAGNOSIS — Z0100 Encounter for examination of eyes and vision without abnormal findings: Secondary | ICD-10-CM | POA: Diagnosis not present

## 2022-10-10 ENCOUNTER — Encounter (INDEPENDENT_AMBULATORY_CARE_PROVIDER_SITE_OTHER): Payer: Self-pay | Admitting: Adult Health

## 2022-10-10 ENCOUNTER — Ambulatory Visit (INDEPENDENT_AMBULATORY_CARE_PROVIDER_SITE_OTHER): Payer: No Typology Code available for payment source | Admitting: Adult Health

## 2022-10-10 VITALS — BP 118/75 | HR 65 | Temp 97.7°F | Ht 65.0 in | Wt 170.0 lb

## 2022-10-10 DIAGNOSIS — R0602 Shortness of breath: Secondary | ICD-10-CM

## 2022-10-10 DIAGNOSIS — E559 Vitamin D deficiency, unspecified: Secondary | ICD-10-CM | POA: Diagnosis not present

## 2022-10-10 DIAGNOSIS — E669 Obesity, unspecified: Secondary | ICD-10-CM | POA: Diagnosis not present

## 2022-10-10 DIAGNOSIS — Z6828 Body mass index (BMI) 28.0-28.9, adult: Secondary | ICD-10-CM | POA: Diagnosis not present

## 2022-10-10 DIAGNOSIS — E6609 Other obesity due to excess calories: Secondary | ICD-10-CM

## 2022-10-11 ENCOUNTER — Telehealth: Payer: Self-pay | Admitting: Pharmacy Technician

## 2022-10-11 ENCOUNTER — Other Ambulatory Visit (INDEPENDENT_AMBULATORY_CARE_PROVIDER_SITE_OTHER): Payer: Self-pay | Admitting: Adult Health

## 2022-10-11 ENCOUNTER — Encounter (INDEPENDENT_AMBULATORY_CARE_PROVIDER_SITE_OTHER): Payer: Self-pay | Admitting: Adult Health

## 2022-10-11 DIAGNOSIS — E559 Vitamin D deficiency, unspecified: Secondary | ICD-10-CM

## 2022-10-11 LAB — VITAMIN D 25 HYDROXY (VIT D DEFICIENCY, FRACTURES): Vit D, 25-Hydroxy: 49.5 ng/mL (ref 30.0–100.0)

## 2022-10-11 MED ORDER — VITAMIN D (ERGOCALCIFEROL) 1.25 MG (50000 UNIT) PO CAPS: 50000.0000 [IU] | ORAL_CAPSULE | ORAL | 0 refills | Status: AC

## 2022-10-11 NOTE — Telephone Encounter (Signed)
Patient Advocate Encounter  Received notification from [facility] that prior authorization for TRULANCE 3MG  is required.   PA submitted on 2.7.24 Key [id number] Status is pending

## 2022-10-18 DIAGNOSIS — I10 Essential (primary) hypertension: Secondary | ICD-10-CM | POA: Diagnosis not present

## 2022-10-18 DIAGNOSIS — Z13820 Encounter for screening for osteoporosis: Secondary | ICD-10-CM | POA: Diagnosis not present

## 2022-10-18 NOTE — Telephone Encounter (Signed)
Patient Advocate Encounter  Prior Authorization for TRULANCE 3MG has been approved.    PA#  [id number] Effective dates: 1.1.24 through 2.6.25

## 2022-10-18 NOTE — Progress Notes (Addendum)
Chief Complaint:   Victoria Page is here to discuss her progress with her obesity treatment plan along with follow-up of her obesity related diagnoses. Victoria Page is on the Category 2 Plan and states she is following her eating plan approximately 70% of the time. [place] states she is walking and going to the gym 30-60 minutes 1 times per week.  Today's visit was #: 58 Starting weight: 175 LBS Starting date: 02/18/2020 Today's weight: 170 LBS Today's date: 10/10/2022 Total lbs lost to date: 5 LBS Total lbs lost since last in-office visit: 2 LBS  Interim History:  Current weight:170 lbs Goal weight: 155 lbs  12/08/21 RMR 1512 10/10/22 RMR 1542- essentially unchanged metabolic rate.  Subjective:   1. SOB (shortness of breath) on exertion She endorses dyspnea with extreme exertion, denies CP.  Her metabolic rate is higher than expected. Anticipated RMR 1390 Actual RMR 1542  12/08/20 RMR 1512 10/10/22 RMR 1542- essentially unchanged metabolic rate.  2. Vitamin D deficiency On 02/09/2022, vitamin D level was 53.5.   Patient was taking weekly ergocalciferol and daily OTC women's multivitamin, but has been off both for 1 to 2 months.  Assessment/Plan:   1. SOB (shortness of breath) on exertion Check IC today.  2. Vitamin D deficiency Check labs today, then MyChart with results and refill as appropriate.  - VITAMIN D 25 Hydroxy (Vit-D Deficiency, Fractures)  3. Obesity, current BMI 28.4 [place] is currently in the action stage of change. As such, her goal is to continue with weight loss efforts. She has agreed to the Category 1 Plan +100 calories. Slight reduction in daily calorie intake due to weight loss plateau and essentially unchanged RMR.  Exercise goals:  As is.  Behavioral modification strategies: increasing lean protein intake, decreasing simple carbohydrates, meal planning and cooking strategies, keeping healthy foods in the home, and planning for  success.  Shealyn has agreed to follow-up with our clinic in 4 weeks. She was informed of the importance of frequent follow-up visits to maximize her success with intensive lifestyle modifications for her multiple health conditions.   Objective:   Blood pressure 118/75, pulse 65, temperature 97.7 F (36.5 C), height 5\' 5"  (1.651 m), weight 170 lb (77.1 kg), SpO2 96 %. Body mass index is 28.29 kg/m.  General: Cooperative, alert, well developed, in no acute distress. HEENT: Conjunctivae and lids unremarkable. Cardiovascular: Regular rhythm.  Lungs: Normal work of breathing. Neurologic: No focal deficits.   Lab Results  Component Value Date   CREATININE 0.59 10/17/2021   BUN 16 10/17/2021   NA 141 10/17/2021   K 4.8 10/17/2021   CL 101 10/17/2021   CO2 25 10/17/2021   Lab Results  Component Value Date   ALT 12 10/17/2021   AST 19 10/17/2021   ALKPHOS 89 10/17/2021   BILITOT 0.4 10/17/2021   Lab Results  Component Value Date   HGBA1C 5.3 10/17/2021   HGBA1C 5.3 05/19/2021   HGBA1C 5.4 11/23/2020   HGBA1C 5.3 02/18/2020   Lab Results  Component Value Date   INSULIN 8.4 10/17/2021   INSULIN 8.4 05/19/2021   INSULIN 4.8 11/23/2020   INSULIN 9.9 07/05/2020   INSULIN 11.4 02/18/2020   Lab Results  Component Value Date   TSH 1.830 02/18/2020   Lab Results  Component Value Date   CHOL 239 (H) 10/17/2021   HDL 60 10/17/2021   LDLCALC 158 (H) 10/17/2021   TRIG 120 10/17/2021   CHOLHDL 4.0 10/17/2021   Lab Results  Component Value Date   VD25OH 49.5 10/10/2022   VD25OH 53.5 02/09/2022   VD25OH 43.7 10/17/2021   Lab Results  Component Value Date   WBC 5.5 09/18/2019   HGB 15.4 09/18/2019   HCT 45.6 (H) 09/18/2019   MCV 89.8 09/18/2019   PLT 242 09/18/2019   No results found for: "IRON", "TIBC", "FERRITIN"  Attestation Statements:   Reviewed by clinician on day of visit: allergies, medications, problem list, medical history, surgical history, family  history, social history, and previous encounter notes.  I, Davy Pique, RMA, am acting as Location manager for Mina Marble, NP.  I have reviewed the above documentation for accuracy and completeness, and I agree with the above. -  Ayodele Sangalang d. Darel Ricketts, NP-C

## 2022-10-19 LAB — LAB REPORT - SCANNED: eGFR: 99

## 2022-10-24 DIAGNOSIS — M6530 Trigger finger, unspecified finger: Secondary | ICD-10-CM | POA: Diagnosis not present

## 2022-10-24 DIAGNOSIS — E669 Obesity, unspecified: Secondary | ICD-10-CM | POA: Diagnosis not present

## 2022-10-24 DIAGNOSIS — J019 Acute sinusitis, unspecified: Secondary | ICD-10-CM | POA: Diagnosis not present

## 2022-10-24 DIAGNOSIS — M542 Cervicalgia: Secondary | ICD-10-CM | POA: Diagnosis not present

## 2022-10-24 DIAGNOSIS — M810 Age-related osteoporosis without current pathological fracture: Secondary | ICD-10-CM | POA: Diagnosis not present

## 2022-10-24 DIAGNOSIS — F411 Generalized anxiety disorder: Secondary | ICD-10-CM | POA: Diagnosis not present

## 2022-10-24 DIAGNOSIS — I10 Essential (primary) hypertension: Secondary | ICD-10-CM | POA: Diagnosis not present

## 2022-10-24 DIAGNOSIS — K219 Gastro-esophageal reflux disease without esophagitis: Secondary | ICD-10-CM | POA: Diagnosis not present

## 2022-10-24 DIAGNOSIS — Z6830 Body mass index (BMI) 30.0-30.9, adult: Secondary | ICD-10-CM | POA: Diagnosis not present

## 2022-10-24 DIAGNOSIS — K581 Irritable bowel syndrome with constipation: Secondary | ICD-10-CM | POA: Diagnosis not present

## 2022-10-24 DIAGNOSIS — M722 Plantar fascial fibromatosis: Secondary | ICD-10-CM | POA: Diagnosis not present

## 2022-10-24 DIAGNOSIS — E785 Hyperlipidemia, unspecified: Secondary | ICD-10-CM | POA: Diagnosis not present

## 2022-10-28 ENCOUNTER — Other Ambulatory Visit: Payer: Self-pay | Admitting: Family Medicine

## 2022-11-02 ENCOUNTER — Encounter: Payer: Self-pay | Admitting: Radiology

## 2022-11-13 ENCOUNTER — Encounter (INDEPENDENT_AMBULATORY_CARE_PROVIDER_SITE_OTHER): Payer: Self-pay | Admitting: Adult Health

## 2022-11-13 ENCOUNTER — Ambulatory Visit (INDEPENDENT_AMBULATORY_CARE_PROVIDER_SITE_OTHER): Payer: No Typology Code available for payment source | Admitting: Adult Health

## 2022-11-13 VITALS — BP 132/81 | HR 67 | Temp 98.0°F | Ht 65.0 in | Wt 172.0 lb

## 2022-11-13 DIAGNOSIS — M791 Myalgia, unspecified site: Secondary | ICD-10-CM | POA: Diagnosis not present

## 2022-11-13 DIAGNOSIS — E669 Obesity, unspecified: Secondary | ICD-10-CM | POA: Diagnosis not present

## 2022-11-13 DIAGNOSIS — E559 Vitamin D deficiency, unspecified: Secondary | ICD-10-CM | POA: Diagnosis not present

## 2022-11-13 DIAGNOSIS — I10 Essential (primary) hypertension: Secondary | ICD-10-CM

## 2022-11-13 DIAGNOSIS — E88819 Insulin resistance, unspecified: Secondary | ICD-10-CM | POA: Diagnosis not present

## 2022-11-13 DIAGNOSIS — E6609 Other obesity due to excess calories: Secondary | ICD-10-CM

## 2022-11-13 NOTE — Progress Notes (Signed)
WEIGHT SUMMARY AND BIOMETRICS  Vitals Temp: 54 F (36.7 C) BP: 132/81 Pulse Rate: 67 SpO2: 97 %   Anthropometric Measurements Height: '5\' 5"'$  (1.651 m) Weight: 172 lb (78 kg) BMI (Calculated): 28.62 Weight at Last Visit: 170lb Weight Lost Since Last Visit: +2lb Starting Weight: 175lb Total Weight Loss (lbs): 3 lb (1.361 kg)   Body Composition  Body Fat %: 41.2 % Fat Mass (lbs): 71 lbs Muscle Mass (lbs): 96.2 lbs Total Body Water (lbs): 68 lbs Visceral Fat Rating : 11   Other Clinical Data Fasting: no Labs: no Today's Visit #: 36 Starting Date: 02/18/20    Chief Complaint:   OBESITY Victoria Page is here to discuss her progress with her obesity treatment plan. She is on the the Category 1 Plan + 100 cal and states she is following her eating plan approximately 90 % of the time.  She states she is exercising walking 30-45 minutes 2 times per week.   Interim History:  Ms. Generette prescribed eating plan was converted from Cat 2 to Cat 1 plus 100 calories at last OV after RMR was checked.  Ms. Villamar has brought hard copies of recent labs PCP/Dr. Nevada Crane completed- reviewed results with pt  Dr. [place] Crane has provided her with Rx of Crestor '5mg'$  June 2023. She started Crestor '5mg'$  EVERY OTHER day early Feb 2024. She stopped statin therapy 11/10/2022 due to severe, diffuse myalgia's.  She was unable to walk as frequently as as far during worst myalgia SE.  Subjective:   1. Myalgia Dr. Nevada Crane has provided her with Rx of Crestor '5mg'$  June 2023. She started Crestor '5mg'$  EVERY OTHER day early Feb 2024. She stopped statin therapy 11/10/2022 due to severe, diffuse myalgia's.  She was unable to walk as frequently as as far during worst myalgia SE.  She reports her father passed from MI age 13 and her sister underwent CABG x 4.  2. Vitamin D deficiency Discussed Labs  Latest Reference Range & Units 10/10/22 10:25  Vitamin D, 25-Hydroxy 30.0 - 100.0 ng/mL 49.5  She is on weekly  Ergocalciferol- denies N/V, recent muscle aches that she believes are r/t Statin therapy.  3. Insulin resistance  Latest Reference Range & Units 10/17/21 09:29  Glucose 70 - 99 mg/dL 81  Hemoglobin A1C 4.8 - 5.6 % 5.3  Est. average glucose Bld gHb Est-mCnc mg/dL 105  INSULIN 2.6 - 24.9 uIU/mL 8.4    4. Essential hypertension She is on daily HCTZ '25mg'$ - managed by PCP. She estimates to drink 80-90 oz water/day.  Assessment/Plan:   1. Myalgia Contact Dr. Hall/PCP- unable to tolerate Crestor '5mg'$  due to myaglia's  2. Vitamin D deficiency Continue weekly Ergocalciferol- does not require refill  3. Insulin resistance Continue to reduce sugar/CHO intake Continue regular walking.  4. Essential hypertension Continue daily diuretic and remain well hydrated.  5. Obesity with current BMI 28.62  [place] is currently in the action stage of change. As such, her goal is to continue with weight loss efforts. She has agreed to the Category 1 Plan. + 100 calories  Exercise goals:  Increase regular walking.  Behavioral modification strategies: increasing lean protein intake, decreasing simple carbohydrates, increasing vegetables, increasing water intake, no skipping meals, meal planning and cooking strategies, keeping healthy foods in the home, and planning for success.  Katherline has agreed to follow-up with our clinic in 4 weeks. She was informed of the importance of frequent follow-up visits to maximize her success with intensive lifestyle modifications for  her multiple health conditions.   Objective:   Blood pressure 132/81, pulse 67, temperature 98 F (36.7 C), height '5\' 5"'$  (1.651 m), weight 172 lb (78 kg), SpO2 97 %. Body mass index is 28.62 kg/m.  General: Cooperative, alert, well developed, in no acute distress. HEENT: Conjunctivae and lids unremarkable. Cardiovascular: Regular rhythm.  Lungs: Normal work of breathing. Neurologic: No focal deficits.   Lab Results  Component  Value Date   CREATININE 0.59 10/17/2021   BUN 16 10/17/2021   NA 141 10/17/2021   K 4.8 10/17/2021   CL 101 10/17/2021   CO2 25 10/17/2021   Lab Results  Component Value Date   ALT 12 10/17/2021   AST 19 10/17/2021   ALKPHOS 89 10/17/2021   BILITOT 0.4 10/17/2021   Lab Results  Component Value Date   HGBA1C 5.3 10/17/2021   HGBA1C 5.3 05/19/2021   HGBA1C 5.4 11/23/2020   HGBA1C 5.3 02/18/2020   Lab Results  Component Value Date   INSULIN 8.4 10/17/2021   INSULIN 8.4 05/19/2021   INSULIN 4.8 11/23/2020   INSULIN 9.9 07/05/2020   INSULIN 11.4 02/18/2020   Lab Results  Component Value Date   TSH 1.830 02/18/2020   Lab Results  Component Value Date   CHOL 239 (H) 10/17/2021   HDL 60 10/17/2021   LDLCALC 158 (H) 10/17/2021   TRIG 120 10/17/2021   CHOLHDL 4.0 10/17/2021   Lab Results  Component Value Date   VD25OH 49.5 10/10/2022   VD25OH 53.5 02/09/2022   VD25OH 43.7 10/17/2021   Lab Results  Component Value Date   WBC 5.5 09/18/2019   HGB 15.4 09/18/2019   HCT 45.6 (H) 09/18/2019   MCV 89.8 09/18/2019   PLT 242 09/18/2019   No results found for: "IRON", "TIBC", "FERRITIN"  Attestation Statements:   Reviewed by clinician on day of visit: allergies, medications, problem list, medical history, surgical history, family history, social history, and previous encounter notes.  I have reviewed the above documentation for accuracy and completeness, and I agree with the above. -  Trishna Cwik d. Xochilt Conant,NP-C

## 2022-12-13 ENCOUNTER — Ambulatory Visit (INDEPENDENT_AMBULATORY_CARE_PROVIDER_SITE_OTHER): Payer: No Typology Code available for payment source | Admitting: Adult Health

## 2023-01-16 ENCOUNTER — Ambulatory Visit: Payer: No Typology Code available for payment source

## 2023-01-17 ENCOUNTER — Ambulatory Visit
Admission: EM | Admit: 2023-01-17 | Discharge: 2023-01-17 | Disposition: A | Discharge status: Home / Self Care | Payer: No Typology Code available for payment source | Attending: Nurse Practitioner | Admitting: Nurse Practitioner

## 2023-01-17 ENCOUNTER — Ambulatory Visit: Payer: No Typology Code available for payment source

## 2023-01-17 DIAGNOSIS — M79672 Pain in left foot: Secondary | ICD-10-CM | POA: Diagnosis not present

## 2023-01-17 MED ORDER — MELOXICAM 15 MG PO TBDP: 15.0000 mg | ORAL_TABLET | Freq: Every day | ORAL | 0 refills | Status: AC

## 2023-01-17 NOTE — ED Triage Notes (Signed)
Pt c/o left foot pai, pt states she has plantar fasciitis flare up on and off 10 mo's and foot pain across the top of the foot. X 1 week ibuprofen has ot helped.

## 2023-01-17 NOTE — Discharge Instructions (Signed)
It sounds like you may have tendinitis in the top of your left foot.  Please take the meloxicam daily as needed for pain.  You can elevate your foot, apply ice 15 minutes on, 45 minutes off every hour while awake to help with swelling and pain.  Take Tylenol every 6 hours as needed for pain as well.  Follow up with Podiatry for persistent or worsening symptoms despite treatment.

## 2023-01-17 NOTE — ED Provider Notes (Signed)
[facility]    CSN: [id number] Arrival date & time: 01/17/23  1541      History   Chief Complaint No chief complaint on file.   HPI Victoria Page is a 67 y.o. female.   Patient presents today with pain in the top of the left foot ongoing for the past couple of days.  Reports history of fasciitis in the has been ongoing and flared up for the past 10 months.  She takes ibuprofen 800 mg which does help with the plantar fasciitis, has not been helping with this pain.  She denies recent injury, trauma, accident, or fall involving the top of her foot.  No weakness with weightbearing or walking, swelling, redness, bruising, numbness or tingling in the toes.  No fevers or nausea/vomiting since the pain began.  Reports she has been trying lots of different shoes from plantar fasciitis and does have a slight knee abnormality walk because of the pain in the bottom of her left foot.    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 02/10/2022   Bone spur of left foot 02/10/2022   Streptococcal sore throat 02/10/2022   SOB (shortness of breath) on exertion 12/08/2021   Acute non-recurrent pansinusitis 01/17/2021   Recurrent sinusitis 01/17/2021   Overweight with body mass index (BMI) of 27 to 27.9 in adult 11/04/2020   Other irritable bowel syndrome 10/24/2020   Cyst of ovary, right 09/28/2020   Urinary frequency 09/10/2020    Pelvic pain 09/10/2020   Pelvic relaxation due to cystocele, midline 09/10/2020   Class 1 obesity due to excess calories in adult 07/20/2020   Fatigue 07/05/2020   At risk for osteoporosis 07/05/2020   Mixed hyperlipidemia 06/16/2020   Insulin resistance 06/16/2020   Vitamin D deficiency 06/16/2020   At risk for heart disease 06/16/2020   Encounter for screening fecal occult blood testing 05/17/2020   History of UTI 05/17/2020   Encounter for gynecological examination with Papanicolaou smear of cervix 05/17/2020   Osteopenia 08/25/2019   Gastroesophageal reflux disease 07/28/2019   Essential hypertension 07/28/2019   Anxiety 07/28/2019   Hypertriglyceridemia 07/28/2019   Sinus pressure 07/28/2019   History of ovarian cyst 08/13/2018   Screening for colorectal cancer 08/13/2018   Routine adult health maintenance 08/13/2018   Uterine tenderness 11/16/2017   Vaginal atrophy 11/16/2017   Vulvar irritation 11/16/2017   UTI (urinary tract infection) 11/13/2017   Encounter for screening colonoscopy 09/05/2016   LLQ pain 08/19/2014   CERVICAL RADICULITIS 06/12/2007   SHOULDER PAIN 05/14/2007   NECK PAIN, ACUTE 05/14/2007   HIGH BLOOD PRESSURE 03/05/2007    Past Surgical History:  Procedure Laterality Date   COLONOSCOPY N/A 09/06/2016   Procedure: COLONOSCOPY;  Surgeon: Malissa Hippo, MD;  Location: [facility];  Service: Endoscopy;  Laterality: N/A;  8:30   FLEXIBLE SIGMOIDOSCOPY      OB History     Gravida  5   Para  3   Term      Preterm      AB  1   Living  3      SAB  1   IAB      Ectopic      Multiple      Live Births               Home Medications    Prior to Admission medications   Medication Sig Start Date End Date Taking? Authorizing Provider  Meloxicam 15 MG TBDP Take 15 mg by mouth daily. 01/17/23  Yes Valentino Nose, NP  ALPRAZolam Prudy Feeler) 0.5 MG tablet Take 0.5 mg by mouth daily as needed. 03/31/20   [provider]   CALCIUM PO Take by mouth. 1 chewable daily    [provider]  cetirizine (ZYRTEC) 10 MG tablet     [provider]  chlorhexidine (PERIDEX) 0.12 % solution  10/10/21   [provider]  clotrimazole (MYCELEX) 10 MG troche     [provider]  co-enzyme Q-10 30 MG capsule Take 30 mg by mouth 3 (three) times daily.    [provider]  famotidine (PEPCID) 20 MG tablet Take 20 mg by mouth 2 (two) times daily. 02/22/22   [provider]  fluticasone (FLONASE) 50 MCG/ACT nasal spray Place 1 spray into both nostrils 2 (two) times daily. 12/10/21   Particia Nearing, PA-C  hydrochlorothiazide (HYDRODIURIL) 25 MG tablet Take 25 mg by mouth daily.    [provider]  Multiple Vitamin (MULTIVITAMIN ADULT PO) Take by mouth.    [provider]  ondansetron (ZOFRAN-ODT) 4 MG disintegrating tablet Take 1 tablet (4 mg total) by mouth every 8 (eight) hours as needed for nausea or vomiting. 10/07/21   Particia Nearing, PA-C  pantoprazole (PROTONIX) 40 MG tablet Take 1 tablet (40 mg total) by mouth daily. 12/10/21   Particia Nearing, PA-C  Plecanatide (TRULANCE) 3 MG TABS Take 3 mg by mouth daily. Please schedule an office visit for further refills. 09/20/22   Armbruster, Willaim Rayas, MD  rosuvastatin (CRESTOR) 5 MG tablet Take 5 mg by mouth daily. 02/10/22   [provider]  Vitamin D, Ergocalciferol, (DRISDOL) 1.25 MG (50000 UNIT) CAPS capsule Take 1 capsule (50,000 Units total) by mouth every 7 (seven) days. 10/11/22   Danford, Jinny Blossom, NP    Family History Family History  Problem Relation Age of Onset   Lung cancer Mother    Anxiety disorder Mother    Alcoholism Mother    Heart attack Father    Sudden death Father    Diverticulitis Paternal Grandmother    Diverticulitis Brother        died from septic shock   COPD Brother    Breast cancer Daughter 82   Other Brother        1/2 brother-   Colon cancer Neg Hx      Social History Social History   Tobacco Use   Smoking status: Never    Passive exposure: Never   Smokeless tobacco: Never  Vaping Use   Vaping Use: Never used  Substance Use Topics   Alcohol use: Yes    Comment: few drinks a year   Drug use: No     Allergies   Prednisone, Shrimp extract, Shrimp [shellfish allergy],  and Sulfa antibiotics   Review of Systems Review of Systems Per HPI  Physical Exam Triage Vital Signs ED Triage Vitals  Enc Vitals Group     BP 01/17/23 1605 126/84     Pulse Rate 01/17/23 1605 76     Resp 01/17/23 1605 18     Temp 01/17/23 1605 98.2 F (36.8 C)     Temp Source 01/17/23 1605 Oral     SpO2 01/17/23 1605 95 %     Weight --      Height --      Head Circumference --      Peak Flow --      Pain Score 01/17/23 1607 6     Pain Loc --      Pain Edu? --      Excl. in GC? --    No data found.  Updated Vital Signs BP 126/84 (BP Location: Right Arm)   Pulse 76   Temp 98.2 F (36.8 C) (Oral)   Resp 18   SpO2 95%   Visual Acuity Right Eye Distance:   Left Eye Distance:   Bilateral Distance:    Right Eye Near:   Left Eye Near:    Bilateral Near:     Physical Exam Vitals and nursing note reviewed.  Constitutional:      General: She is not in acute distress.    Appearance: Normal appearance. She is not toxic-appearing.  HENT:     Mouth/Throat:     Mouth: Mucous membranes are moist.     Pharynx: Oropharynx is clear.  Pulmonary:     Effort: Pulmonary effort is normal. No respiratory distress.  Musculoskeletal:       Feet:     Comments: Inspection: No swelling, obvious deformity, redness, or bruising to left dorsal foot Palpation: Left foot tender to palpation along the base of the second through fourth metatarsals in approximately area marked; no obvious deformities palpated ROM: Full ROM to left foot including flexibility Strength: 5/5 left lower extremity Neurovascular: neurovascularly intact in left and right lower  extremity   Skin:    General: Skin is warm and dry.     Capillary Refill: Capillary refill takes less than 2 seconds.     Coloration: Skin is not jaundiced or pale.     Findings: No erythema.  Neurological:     Mental Status: She is alert and oriented to person, place, and time.  Psychiatric:        Behavior: Behavior is cooperative.      UC Treatments / Results  Labs (all labs ordered are listed, but only abnormal results are displayed) Labs Reviewed - No data to display  EKG   Radiology No results found.  Procedures Procedures (including critical care time)  Medications Ordered in UC Medications - No data to display  Initial Impression / Assessment and Plan / UC Course  I have reviewed the triage vital signs and the nursing notes.  Pertinent labs & imaging results that were available during my care of the patient were reviewed by me and considered in my medical decision making (see chart for details).   Patient is well-appearing, normotensive, afebrile, not tachycardic, not tachypneic, oxygenating well on room air.    1. Left foot pain Patient presents x-ray imaging deferred today given no known injury Suspect possible tendinitis of the left dorsal foot secondary to altered gait from primary fasciitis Ibuprofen, start meloxicam Recommended rest, ice, elevation Follow-up with podiatry if no improvement or worsening  of symptoms despite treatment   The patient was given the opportunity to ask questions.  All questions answered to their satisfaction.  The patient is in agreement to this plan.   Final Clinical Impressions(s) / UC Diagnoses   Final diagnoses:  Left foot pain     Discharge Instructions      It sounds like you may have tendinitis in the top of your left foot.  Please take the meloxicam daily as needed for pain.  You can elevate your foot, apply ice 15 minutes on, 45 minutes off every hour while awake to help with swelling and pain.  Take Tylenol  every 6 hours as needed for pain as well.  Follow up with Podiatry for persistent or worsening symptoms despite treatment.    ED Prescriptions     Medication Sig Dispense Auth. Provider   Meloxicam 15 MG TBDP Take 15 mg by mouth daily. 30 tablet Valentino Nose, NP      PDMP not reviewed this encounter.   Valentino Nose, NP 01/17/23 [phone]

## 2023-01-18 ENCOUNTER — Ambulatory Visit: Payer: No Typology Code available for payment source

## 2023-01-30 DIAGNOSIS — I10 Essential (primary) hypertension: Secondary | ICD-10-CM | POA: Diagnosis not present

## 2023-01-30 DIAGNOSIS — M810 Age-related osteoporosis without current pathological fracture: Secondary | ICD-10-CM | POA: Diagnosis not present

## 2023-02-05 DIAGNOSIS — K581 Irritable bowel syndrome with constipation: Secondary | ICD-10-CM | POA: Diagnosis not present

## 2023-02-05 DIAGNOSIS — E669 Obesity, unspecified: Secondary | ICD-10-CM | POA: Diagnosis not present

## 2023-02-05 DIAGNOSIS — K219 Gastro-esophageal reflux disease without esophagitis: Secondary | ICD-10-CM | POA: Diagnosis not present

## 2023-02-05 DIAGNOSIS — E785 Hyperlipidemia, unspecified: Secondary | ICD-10-CM | POA: Diagnosis not present

## 2023-02-05 DIAGNOSIS — M542 Cervicalgia: Secondary | ICD-10-CM | POA: Diagnosis not present

## 2023-02-05 DIAGNOSIS — I10 Essential (primary) hypertension: Secondary | ICD-10-CM | POA: Diagnosis not present

## 2023-02-05 DIAGNOSIS — M6530 Trigger finger, unspecified finger: Secondary | ICD-10-CM | POA: Diagnosis not present

## 2023-02-05 DIAGNOSIS — M722 Plantar fascial fibromatosis: Secondary | ICD-10-CM | POA: Diagnosis not present

## 2023-02-05 DIAGNOSIS — M810 Age-related osteoporosis without current pathological fracture: Secondary | ICD-10-CM | POA: Diagnosis not present

## 2023-02-05 DIAGNOSIS — G720 Drug-induced myopathy: Secondary | ICD-10-CM | POA: Diagnosis not present

## 2023-02-05 DIAGNOSIS — R110 Nausea: Secondary | ICD-10-CM | POA: Diagnosis not present

## 2023-02-05 DIAGNOSIS — F411 Generalized anxiety disorder: Secondary | ICD-10-CM | POA: Diagnosis not present

## 2023-02-27 ENCOUNTER — Other Ambulatory Visit: Payer: Self-pay | Admitting: Nurse Practitioner

## 2023-02-28 NOTE — Telephone Encounter (Signed)
Prescriber and PCP not at this practice  Requested Prescriptions  Refused Prescriptions Disp Refills   meloxicam (MOBIC) 15 MG tablet [Pharmacy Med Name: MELOXICAM 15 MG TABLET] 30 tablet 0    Sig: TAKE (1) TABLET BY MOUTH ONCE DAILY.     Analgesics:  COX2 Inhibitors Failed - 02/27/2023  1:48 PM      Failed - Manual Review: Labs are only required if the patient has taken medication for more than 8 weeks.      Failed - HGB in normal range and within 360 days    Hemoglobin  Date Value Ref Range Status  09/18/2019 15.4 11.7 - 15.5 g/dL Final         Failed - Cr in normal range and within 360 days    Creat  Date Value Ref Range Status  09/18/2019 0.80 0.50 - 0.99 mg/dL Final    Comment:    For patients >28 years of age, the reference limit for Creatinine is approximately 13% higher for people identified as African-American. .    Creatinine, Ser  Date Value Ref Range Status  10/17/2021 0.59 0.57 - 1.00 mg/dL Final         Failed - HCT in normal range and within 360 days    HCT  Date Value Ref Range Status  09/18/2019 45.6 (H) 35.0 - 45.0 % Final         Failed - AST in normal range and within 360 days    AST  Date Value Ref Range Status  10/17/2021 19 0 - 40 IU/L Final         Failed - ALT in normal range and within 360 days    ALT  Date Value Ref Range Status  10/17/2021 12 0 - 32 IU/L Final         Failed - Valid encounter within last 12 months    Recent Outpatient Visits   None            Passed - eGFR is 30 or above and within 360 days    GFR, Est African American  Date Value Ref Range Status  09/18/2019 92 > OR = 60 mL/min/1.60m2 Final   GFR calc Af Amer  Date Value Ref Range Status  05/28/2020 103  Final   GFR, Est Non African American  Date Value Ref Range Status  09/18/2019 79 > OR = 60 mL/min/1.63m2 Final   GFR calc non Af Amer  Date Value Ref Range Status  05/28/2020 89  Final   eGFR  Date Value Ref Range Status  10/19/2022 99  Final     Comment:    Abstracted by HIM         Passed - Patient is not pregnant

## 2023-03-27 ENCOUNTER — Ambulatory Visit
Admission: RE | Admit: 2023-03-27 | Discharge: 2023-03-27 | Disposition: A | Discharge status: Home / Self Care | Payer: No Typology Code available for payment source | Source: Ambulatory Visit

## 2023-03-27 ENCOUNTER — Other Ambulatory Visit: Payer: Self-pay

## 2023-03-27 VITALS — BP 146/91 | HR 64 | Temp 98.1°F | Resp 20

## 2023-03-27 DIAGNOSIS — B9689 Other specified bacterial agents as the cause of diseases classified elsewhere: Secondary | ICD-10-CM | POA: Diagnosis not present

## 2023-03-27 DIAGNOSIS — J019 Acute sinusitis, unspecified: Secondary | ICD-10-CM | POA: Diagnosis not present

## 2023-03-27 MED ORDER — BENZONATATE 100 MG PO CAPS: 100.0000 mg | ORAL_CAPSULE | Freq: Three times a day (TID) | ORAL | 0 refills | Status: DC | PRN

## 2023-03-27 MED ORDER — AMOXICILLIN-POT CLAVULANATE 875-125 MG PO TABS: 1.0000 | ORAL_TABLET | Freq: Two times a day (BID) | ORAL | 0 refills | Status: AC

## 2023-03-27 NOTE — ED Triage Notes (Signed)
Pt reports cough, facial pressure, nasal congestion, sore throat, body aches x6 days. Home covid test negative.

## 2023-03-27 NOTE — Discharge Instructions (Addendum)
You have a sinus infection.  Take Augmentin as prescribed to treat it.  Symptoms should improve over the next week to 10 days.  If you develop chest pain or shortness of breath, go to the emergency room.  Some things that can make you feel better are: - Increased rest - Increasing fluid with water/sugar free electrolytes - Acetaminophen and ibuprofen as needed for fever/pain - Salt water gargling, chloraseptic spray and throat lozenges - OTC guaifenesin (Mucinex) 600 mg twice daily - Saline sinus flushes or a neti pot - Humidifying the air --Tessalon Perles every 8 hours as needed for dry cough

## 2023-03-27 NOTE — ED Provider Notes (Signed)
[facility]    CSN: [id number] Arrival date & time: 03/27/23  1755      History   Chief Complaint Chief Complaint  Patient presents with   Sore Throat    sinus inf symptoms - Entered by patient    HPI Victoria Page is a 67 y.o. female.   Patient presents today with 6-day history of dry cough, stuffy nose, sore throat, sinus pressure in her cheeks and above her eyes, pain with neck movement, headache, decreased appetite, and fatigue.  No fever, body aches or chills, congested cough, shortness of breath or chest pain, runny nose, ear pain, abdominal pain, nausea/vomiting, or diarrhea.  Home COVID test negative earlier this week.  Reports she knows of multiple people who are sick with similar symptoms.  Has not taking Sudafed which seems to help a little, Nettie pot gives some relief.  She reports this is typical symptoms for her with a sinus infection.  No antibiotic use in the past 90 days.    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 02/10/2022   Bone spur of left foot 02/10/2022   Streptococcal sore throat 02/10/2022   SOB (shortness of breath) on exertion 12/08/2021   Acute non-recurrent pansinusitis 01/17/2021   Recurrent sinusitis 01/17/2021   Overweight with body mass index (BMI) of 27 to 27.9 in adult 11/04/2020   Other irritable bowel syndrome 10/24/2020   Cyst of ovary, right 09/28/2020   Urinary  frequency 09/10/2020   Pelvic pain 09/10/2020   Pelvic relaxation due to cystocele, midline 09/10/2020   Class 1 obesity due to excess calories in adult 07/20/2020   Fatigue 07/05/2020   At risk for osteoporosis 07/05/2020   Mixed hyperlipidemia 06/16/2020   Insulin resistance 06/16/2020   Vitamin D deficiency 06/16/2020   At risk for heart disease 06/16/2020   Encounter for screening fecal occult blood testing 05/17/2020   History of UTI 05/17/2020   Encounter for gynecological examination with Papanicolaou smear of cervix 05/17/2020   Osteopenia 08/25/2019   Gastroesophageal reflux disease 07/28/2019   Essential hypertension 07/28/2019   Anxiety 07/28/2019   Hypertriglyceridemia 07/28/2019   Sinus pressure 07/28/2019   History of ovarian cyst 08/13/2018   Screening for colorectal cancer 08/13/2018   Routine adult health maintenance 08/13/2018   Uterine tenderness 11/16/2017   Vaginal atrophy 11/16/2017   Vulvar irritation 11/16/2017   UTI (urinary tract infection) 11/13/2017   Encounter for screening colonoscopy 09/05/2016   LLQ pain 08/19/2014   CERVICAL RADICULITIS 06/12/2007   SHOULDER PAIN 05/14/2007   NECK PAIN, ACUTE 05/14/2007   HIGH BLOOD PRESSURE 03/05/2007    Past Surgical History:  Procedure Laterality Date   COLONOSCOPY N/A 09/06/2016   Procedure: COLONOSCOPY;  Surgeon: Malissa Hippo, MD;  Location: [facility];  Service: Endoscopy;  Laterality: N/A;  8:30   FLEXIBLE SIGMOIDOSCOPY      OB History     Gravida  5   Para  3   Term      Preterm      AB  1   Living  3      SAB  1   IAB      Ectopic      Multiple      Live Births               Home Medications    Prior to Admission medications   Medication Sig Start Date End Date Taking? Authorizing Provider  amoxicillin-clavulanate (AUGMENTIN) 875-125 MG tablet Take 1 tablet by mouth 2 (two) times daily for 7 days. 03/27/23 04/03/23 Yes Valentino Nose, NP  Bempedoic Acid  (NEXLETOL PO) Take by mouth daily. Unknown dosage amount.   Yes [provider]  benzonatate (TESSALON) 100 MG capsule Take 1 capsule (100 mg total) by mouth 3 (three) times daily as needed for cough. Do not take with alcohol or while driving or operating heavy machinery.  May cause drowsiness. 03/27/23  Yes Valentino Nose, NP  ALPRAZolam Prudy Feeler) 0.5 MG tablet Take 0.5 mg by mouth daily as needed. 03/31/20   [provider]  CALCIUM PO Take by mouth. 1 chewable daily    [provider]  cetirizine (ZYRTEC) 10 MG tablet     [provider]  chlorhexidine (PERIDEX) 0.12 % solution  10/10/21   [provider]  clotrimazole (MYCELEX) 10 MG troche     [provider]  co-enzyme Q-10 30 MG capsule Take 30 mg by mouth 3 (three) times daily.    [provider]  famotidine (PEPCID) 20 MG tablet Take 20 mg by mouth 2 (two) times daily. 02/22/22   [provider]  fluticasone (FLONASE) 50 MCG/ACT nasal spray Place 1 spray into both nostrils 2 (two) times daily. 12/10/21   Particia Nearing, PA-C  hydrochlorothiazide (HYDRODIURIL) 25 MG tablet Take 25 mg by mouth daily.    [provider]  Meloxicam 15 MG TBDP Take 15 mg by mouth daily. 01/17/23   Valentino Nose, NP  Multiple Vitamin (MULTIVITAMIN ADULT PO) Take by mouth.    [provider]  ondansetron (ZOFRAN-ODT) 4 MG disintegrating tablet Take 1 tablet (4 mg total) by mouth every 8 (eight) hours as needed for nausea or vomiting. 10/07/21   Particia Nearing, PA-C  pantoprazole (PROTONIX) 40 MG tablet Take 1 tablet (40 mg total) by mouth daily. 12/10/21   Particia Nearing, PA-C  Plecanatide (TRULANCE) 3 MG TABS Take 3 mg by mouth daily. Please schedule an office visit for further refills. 09/20/22   Armbruster, Willaim Rayas, MD  rosuvastatin (CRESTOR) 5 MG tablet Take 5 mg by mouth daily. 02/10/22   [provider]  Vitamin D, Ergocalciferol, (DRISDOL)  1.25 MG (50000 UNIT) CAPS capsule Take 1 capsule (50,000 Units total) by mouth every 7 (seven) days. 10/11/22   DanfordJinny Blossom, NP    Family History Family History  Problem Relation Age of Onset   Lung cancer Mother    Anxiety disorder Mother    Alcoholism Mother    Heart attack Father    Sudden death Father    Diverticulitis Paternal Grandmother    Diverticulitis Brother        died from septic shock   COPD Brother    Breast cancer Daughter 57   Other  Brother        1/2 brother-   Colon cancer Neg Hx     Social History Social History   Tobacco Use   Smoking status: Never    Passive exposure: Never   Smokeless tobacco: Never  Vaping Use   Vaping status: Never Used  Substance Use Topics   Alcohol use: Yes    Comment: few drinks a year   Drug use: No     Allergies   Prednisone, Shrimp extract, Shrimp [shellfish allergy], and Sulfa antibiotics   Review of Systems Review of Systems Per HPI  Physical Exam Triage Vital Signs ED Triage Vitals  Encounter Vitals Group     BP 03/27/23 1853 (!) 146/91     Systolic BP Percentile --      Diastolic BP Percentile --      Pulse Rate 03/27/23 1853 64     Resp 03/27/23 1853 20     Temp 03/27/23 1853 98.1 F (36.7 C)     Temp Source 03/27/23 1853 Oral     SpO2 03/27/23 1853 97 %     Weight --      Height --      Head Circumference --      Peak Flow --      Pain Score 03/27/23 1850 6     Pain Loc --      Pain Education --      Exclude from Growth Chart --    No data found.  Updated Vital Signs BP (!) 146/91 (BP Location: Right Arm)   Pulse 64   Temp 98.1 F (36.7 C) (Oral)   Resp 20   SpO2 97%   Visual Acuity Right Eye Distance:   Left Eye Distance:   Bilateral Distance:    Right Eye Near:   Left Eye Near:    Bilateral Near:     Physical Exam Vitals and nursing note reviewed.  Constitutional:      General: She is not in acute distress.    Appearance: Normal appearance. She is not ill-appearing or  toxic-appearing.  HENT:     Head: Normocephalic and atraumatic.     Right Ear: Tympanic membrane, ear canal and external ear normal. No drainage, swelling or tenderness. No middle ear effusion. Tympanic membrane is not erythematous.     Left Ear: Tympanic membrane, ear canal and external ear normal. No drainage, swelling or tenderness.  No middle ear effusion. Tympanic membrane is not erythematous.     Nose: Congestion present. No rhinorrhea.     Right Sinus: Maxillary sinus tenderness and frontal sinus tenderness present.     Left Sinus: Maxillary sinus tenderness and frontal sinus tenderness present.     Mouth/Throat:     Mouth: Mucous membranes are moist.     Pharynx: Oropharynx is clear. Posterior oropharyngeal erythema present. No oropharyngeal exudate.     Tonsils: No tonsillar exudate.  Eyes:     General: No scleral icterus.    Extraocular Movements: Extraocular movements intact.  Cardiovascular:     Rate and Rhythm: Normal rate and regular rhythm.  Pulmonary:     Effort: Pulmonary effort is normal. No respiratory distress.     Breath sounds: Normal breath sounds. No wheezing, rhonchi or rales.  Abdominal:     General: Abdomen is flat. Bowel sounds are normal. There is no distension.     Palpations: Abdomen is soft.  Musculoskeletal:     Cervical back: Normal range of motion and neck supple.  Lymphadenopathy:     Cervical: No cervical adenopathy.  Skin:    General: Skin is warm and dry.     Capillary Refill: Capillary refill takes less than 2 seconds.     Coloration: Skin is not jaundiced or pale.     Findings: No erythema or rash.  Neurological:     Mental Status: She is alert and oriented to person, place, and time.     Motor: No weakness.  Psychiatric:        Behavior: Behavior is cooperative.      UC Treatments / Results  Labs (all labs ordered are listed, but only abnormal results are displayed) Labs Reviewed - No data to display  EKG   Radiology No  results found.  Procedures Procedures (including critical care time)  Medications Ordered in UC Medications - No data to display  Initial Impression / Assessment and Plan / UC Course  I have reviewed the triage vital signs and the nursing notes.  Pertinent labs & imaging results that were available during my care of the patient were reviewed by me and considered in my medical decision making (see chart for details).   Patient is well-appearing, normotensive, afebrile, not tachycardic, not tachypneic, oxygenating well on room air.    1. Acute bacterial sinusitis Treat with Augmentin twice daily for 7 days Other supportive care discussed including Mucinex, cough suppressant medication, continue Nettie pot ER and return precautions discussed with patient  The patient was given the opportunity to ask questions.  All questions answered to their satisfaction.  The patient is in agreement to this plan.    Final Clinical Impressions(s) / UC Diagnoses   Final diagnoses:  Acute bacterial sinusitis     Discharge Instructions      You have a sinus infection.  Take Augmentin as prescribed to treat it.  Symptoms should improve over the next week to 10 days.  If you develop chest pain or shortness of breath, go to the emergency room.  Some things that can make you feel better are: - Increased rest - Increasing fluid with water/sugar free electrolytes - Acetaminophen and ibuprofen as needed for fever/pain - Salt water gargling, chloraseptic spray and throat lozenges - OTC guaifenesin (Mucinex) 600 mg twice daily - Saline sinus flushes or a neti pot - Humidifying the air --Tessalon Perles every 8 hours as needed for dry cough      ED Prescriptions     Medication Sig Dispense Auth. Provider   amoxicillin-clavulanate (AUGMENTIN) 875-125 MG tablet Take 1 tablet by mouth 2 (two) times daily for 7 days. 14 tablet Cathlean Marseilles A, NP   benzonatate (TESSALON) 100 MG capsule Take 1  capsule (100 mg total) by mouth 3 (three) times daily as needed for cough. Do not take with alcohol or while driving or operating heavy machinery.  May cause drowsiness. 21 capsule Valentino Nose, NP      PDMP not reviewed this encounter.   Valentino Nose, NP 03/27/23 [phone]

## 2023-03-31 DIAGNOSIS — Z79899 Other long term (current) drug therapy: Secondary | ICD-10-CM | POA: Diagnosis not present

## 2023-03-31 DIAGNOSIS — R110 Nausea: Secondary | ICD-10-CM | POA: Diagnosis not present

## 2023-03-31 DIAGNOSIS — J019 Acute sinusitis, unspecified: Secondary | ICD-10-CM | POA: Diagnosis not present

## 2023-03-31 DIAGNOSIS — K219 Gastro-esophageal reflux disease without esophagitis: Secondary | ICD-10-CM | POA: Diagnosis not present

## 2023-04-26 ENCOUNTER — Telehealth: Payer: Self-pay | Admitting: Gastroenterology

## 2023-04-26 MED ORDER — LINACLOTIDE 145 MCG PO CAPS: 145.0000 ug | ORAL_CAPSULE | Freq: Every day | ORAL | 0 refills | Status: AC

## 2023-04-26 NOTE — Telephone Encounter (Signed)
Patient called states her insurance will no longer pay for Trulance but they will cover Linzess which she likes better. Also asked if she can have 145 mg. Please advise.

## 2023-04-26 NOTE — Telephone Encounter (Signed)
That's fine can change to Linzess / day. Thanks

## 2023-04-26 NOTE — Telephone Encounter (Signed)
Script for [employer] sent to [employer] [facility]

## 2023-05-07 ENCOUNTER — Ambulatory Visit
Admission: EM | Admit: 2023-05-07 | Discharge: 2023-05-07 | Disposition: A | Discharge status: Home / Self Care | Payer: No Typology Code available for payment source | Attending: Family Medicine | Admitting: Family Medicine

## 2023-05-07 DIAGNOSIS — M545 Low back pain, unspecified: Secondary | ICD-10-CM | POA: Diagnosis not present

## 2023-05-07 LAB — POCT URINALYSIS DIP (MANUAL ENTRY)
Bilirubin, UA: NEGATIVE
Blood, UA: NEGATIVE
Glucose, UA: NEGATIVE mg/dL
Ketones, POC UA: NEGATIVE mg/dL
Leukocytes, UA: NEGATIVE
Nitrite, UA: NEGATIVE
Protein Ur, POC: NEGATIVE mg/dL
Spec Grav, UA: 1.015 (ref 1.010–1.025)
Urobilinogen, UA: 0.2 U/dL
pH, UA: 7 (ref 5.0–8.0)

## 2023-05-07 MED ORDER — METHOCARBAMOL 500 MG PO TABS: 500.0000 mg | ORAL_TABLET | Freq: Three times a day (TID) | ORAL | 0 refills | Status: AC | PRN

## 2023-05-07 NOTE — ED Triage Notes (Signed)
Lower back pain and lower abdominal pain that started Friday. No urinary frequency or burning at this time. Tried taking meloxicam and Robaxin with no relief.

## 2023-05-07 NOTE — Discharge Instructions (Signed)
Your vital signs and exam are overall very reassuring today.  Your urinalysis does not show evidence of a urinary tract infection or kidney stone to be causing your symptoms.  We will treat with anti-inflammatories, muscle relaxers, heat, massage and follow-up with your primary care provider soon as possible for a recheck.  Return at anytime with worsening symptoms.

## 2023-05-08 NOTE — ED Provider Notes (Signed)
[facility]    CSN: [id number] Arrival date & time: 05/07/23  0818      History   Chief Complaint Chief Complaint  Patient presents with   Back Pain    HPI Victoria Page is a 67 y.o. female.   Patient presenting today with lower back and lower abdominal pain that started 3 days ago.  States the pain is bilateral, made worse with movement and improved with rest.  Denies associated urinary symptoms, nausea, vomiting, bowel changes, fevers, chills.  Try meloxicam and Robaxin with minimal relief.  Does have a history of chronic back pain.    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 02/10/2022   Bone spur of left foot 02/10/2022   Streptococcal sore throat 02/10/2022   SOB (shortness of breath) on exertion 12/08/2021   Acute non-recurrent pansinusitis 01/17/2021   Recurrent sinusitis 01/17/2021   Overweight with body mass index (BMI) of 27 to 27.9 in adult 11/04/2020   Other irritable bowel syndrome 10/24/2020   Cyst of ovary, right 09/28/2020   Urinary frequency 09/10/2020   Pelvic pain 09/10/2020   Pelvic relaxation due to cystocele, midline 09/10/2020   Class 1 obesity due to excess calories in adult 07/20/2020   Fatigue 07/05/2020   At risk for osteoporosis 07/05/2020   Mixed hyperlipidemia 06/16/2020   Insulin resistance 06/16/2020   Vitamin D deficiency 06/16/2020   At risk for heart disease 06/16/2020    Encounter for screening fecal occult blood testing 05/17/2020   History of UTI 05/17/2020   Encounter for gynecological examination with Papanicolaou smear of cervix 05/17/2020   Osteopenia 08/25/2019   Gastroesophageal reflux disease 07/28/2019   Essential hypertension 07/28/2019   Anxiety 07/28/2019   Hypertriglyceridemia 07/28/2019   Sinus pressure 07/28/2019   History of ovarian cyst 08/13/2018   Screening for colorectal cancer 08/13/2018   Routine adult health maintenance 08/13/2018   Uterine tenderness 11/16/2017   Vaginal atrophy 11/16/2017   Vulvar irritation 11/16/2017   UTI (urinary tract infection) 11/13/2017   Encounter for screening colonoscopy 09/05/2016   LLQ pain 08/19/2014   CERVICAL RADICULITIS 06/12/2007   SHOULDER PAIN 05/14/2007   NECK PAIN, ACUTE 05/14/2007   HIGH BLOOD PRESSURE 03/05/2007    Past Surgical History:  Procedure Laterality Date   COLONOSCOPY N/A 09/06/2016   Procedure: COLONOSCOPY;  Surgeon: Malissa Hippo, MD;  Location: [facility];  Service: Endoscopy;  Laterality: N/A;  8:30   FLEXIBLE SIGMOIDOSCOPY      OB History     Gravida  5   Para  3   Term      Preterm      AB  1   Living  3      SAB  1   IAB      Ectopic  Multiple      Live Births               Home Medications    Prior to Admission medications   Medication Sig Start Date End Date Taking? Authorizing Provider  Bempedoic Acid (NEXLETOL PO) Take by mouth daily. Unknown dosage amount.   Yes [provider]  cetirizine (ZYRTEC) 10 MG tablet    Yes [provider]  co-enzyme Q-10 30 MG capsule Take 30 mg by mouth 3 (three) times daily.   Yes [provider]  fluticasone (FLONASE) 50 MCG/ACT nasal spray Place 1 spray into both nostrils 2 (two) times daily. 12/10/21  Yes Particia Nearing, PA-C  hydrochlorothiazide (HYDRODIURIL) 25 MG tablet Take 25 mg by mouth daily.   Yes [provider]  linaclotide  (LINZESS) 145 MCG CAPS capsule Take 1 capsule (145 mcg total) by mouth daily before breakfast. Please schedule a follow up appointment for further refills. Thank you. 04/26/23  Yes Armbruster, Willaim Rayas, MD  Meloxicam 15 MG TBDP Take 15 mg by mouth daily. 01/17/23  Yes Valentino Nose, NP  methocarbamol (ROBAXIN) 500 MG tablet Take 1 tablet (500 mg total) by mouth every 8 (eight) hours as needed for muscle spasms. Do not drink alcohol or drive while taking this medication.  May cause drowsiness. 05/07/23  Yes Particia Nearing, PA-C  ondansetron (ZOFRAN-ODT) 4 MG disintegrating tablet Take 1 tablet (4 mg total) by mouth every 8 (eight) hours as needed for nausea or vomiting. 10/07/21  Yes Particia Nearing, PA-C  pantoprazole (PROTONIX) 40 MG tablet Take 1 tablet (40 mg total) by mouth daily. 12/10/21  Yes Particia Nearing, PA-C  ALPRAZolam Prudy Feeler) 0.5 MG tablet Take 0.5 mg by mouth daily as needed. 03/31/20   [provider]  benzonatate (TESSALON) 100 MG capsule Take 1 capsule (100 mg total) by mouth 3 (three) times daily as needed for cough. Do not take with alcohol or while driving or operating heavy machinery.  May cause drowsiness. 03/27/23   Valentino Nose, NP  CALCIUM PO Take by mouth. 1 chewable daily    [provider]  chlorhexidine (PERIDEX) 0.12 % solution  10/10/21   [provider]  clotrimazole (MYCELEX) 10 MG troche     [provider]  famotidine (PEPCID) 20 MG tablet Take 20 mg by mouth 2 (two) times daily. 02/22/22   [provider]  Multiple Vitamin (MULTIVITAMIN ADULT PO) Take by mouth.    [provider]  rosuvastatin (CRESTOR) 5 MG tablet Take 5 mg by mouth daily. 02/10/22   [provider]  Vitamin D, Ergocalciferol, (DRISDOL) 1.25 MG (50000 UNIT) CAPS capsule Take 1 capsule (50,000 Units total) by mouth every 7 (seven) days. 10/11/22   DanfordJinny Blossom, NP    Family History Family History  Problem  Relation Age of Onset   Lung cancer Mother    Anxiety disorder Mother    Alcoholism Mother    Heart attack Father    Sudden death Father    Diverticulitis Paternal Grandmother    Diverticulitis Brother        died from septic shock   COPD Brother    Breast cancer Daughter 77   Other Brother        1/2 brother-   Colon cancer Neg Hx     Social History Social History   Tobacco Use   Smoking status: Never    Passive exposure: Never   Smokeless tobacco:  Never  Vaping Use   Vaping status: Never Used  Substance Use Topics   Alcohol use: Yes    Comment: few drinks a year   Drug use: No     Allergies   Prednisone, Shrimp extract, Shrimp [shellfish allergy], and Sulfa antibiotics   Review of Systems Review of Systems Per HPI  Physical Exam Triage Vital Signs ED Triage Vitals  Encounter Vitals Group     BP 05/07/23 0825 136/84     Systolic BP Percentile --      Diastolic BP Percentile --      Pulse Rate 05/07/23 0825 70     Resp 05/07/23 0825 18     Temp 05/07/23 0825 (!) 97.5 F (36.4 C)     Temp Source 05/07/23 0825 Oral     SpO2 05/07/23 0825 98 %     Weight --      Height --      Head Circumference --      Peak Flow --      Pain Score 05/07/23 0826 9     Pain Loc --      Pain Education --      Exclude from Growth Chart --    No data found.  Updated Vital Signs BP 136/84 (BP Location: Right Arm)   Pulse 70   Temp (!) 97.5 F (36.4 C) (Oral)   Resp 18   SpO2 98%   Visual Acuity Right Eye Distance:   Left Eye Distance:   Bilateral Distance:    Right Eye Near:   Left Eye Near:    Bilateral Near:     Physical Exam Vitals and nursing note reviewed.  Constitutional:      Appearance: Normal appearance. She is not ill-appearing.  HENT:     Head: Atraumatic.     Mouth/Throat:     Mouth: Mucous membranes are moist.     Pharynx: Oropharynx is clear.  Eyes:     Extraocular Movements: Extraocular movements intact.     Conjunctiva/sclera:  Conjunctivae normal.  Cardiovascular:     Rate and Rhythm: Normal rate and regular rhythm.     Heart sounds: Normal heart sounds.  Pulmonary:     Effort: Pulmonary effort is normal.     Breath sounds: Normal breath sounds.  Abdominal:     General: Bowel sounds are normal. There is no distension.     Palpations: Abdomen is soft.     Tenderness: There is no abdominal tenderness. There is no right CVA tenderness, left CVA tenderness or guarding.  Musculoskeletal:        General: Tenderness present. No swelling, deformity or signs of injury. Normal range of motion.     Cervical back: Normal range of motion and neck supple.     Comments: No midline spinal tenderness to palpation diffusely.  Minimal tenderness to palpation to the lateral lumbar musculature bilaterally.  Normal gait and range of motion  Skin:    General: Skin is warm and dry.  Neurological:     Mental Status: She is alert and oriented to person, place, and time.  Psychiatric:        Mood and Affect: Mood normal.        Thought Content: Thought content normal.        Judgment: Judgment normal.      UC Treatments / Results  Labs (all labs ordered are listed, but only abnormal results are displayed) Labs Reviewed  POCT URINALYSIS DIP (MANUAL ENTRY)  EKG   Radiology No results found.  Procedures Procedures (including critical care time)  Medications Ordered in UC Medications - No data to display  Initial Impression / Assessment and Plan / UC Course  I have reviewed the triage vital signs and the nursing notes.  Pertinent labs & imaging results that were available during my care of the patient were reviewed by me and considered in my medical decision making (see chart for details).     Urinalysis today without evidence of urinary tract infection and vitals and exam very reassuring.  Suspect musculoskeletal nature of symptoms.  Treat with Robaxin, anti-inflammatory pain medication, heat, massage, stretches.   Return for worsening symptoms.  Final Clinical Impressions(s) / UC Diagnoses   Final diagnoses:  Acute bilateral low back pain without sciatica     Discharge Instructions      Your vital signs and exam are overall very reassuring today.  Your urinalysis does not show evidence of a urinary tract infection or kidney stone to be causing your symptoms.  We will treat with anti-inflammatories, muscle relaxers, heat, massage and follow-up with your primary care provider soon as possible for a recheck.  Return at anytime with worsening symptoms.    ED Prescriptions     Medication Sig Dispense Auth. Provider   methocarbamol (ROBAXIN) 500 MG tablet Take 1 tablet (500 mg total) by mouth every 8 (eight) hours as needed for muscle spasms. Do not drink alcohol or drive while taking this medication.  May cause drowsiness. 15 tablet Particia Nearing, [place]      PDMP not reviewed this encounter.   Particia Nearing, [place] 05/08/23 1000

## 2023-05-10 ENCOUNTER — Other Ambulatory Visit (HOSPITAL_COMMUNITY): Payer: Self-pay | Admitting: Internal Medicine

## 2023-05-10 DIAGNOSIS — M545 Low back pain, unspecified: Secondary | ICD-10-CM | POA: Diagnosis not present

## 2023-05-10 DIAGNOSIS — R52 Pain, unspecified: Secondary | ICD-10-CM

## 2023-05-15 ENCOUNTER — Ambulatory Visit (HOSPITAL_COMMUNITY)
Admission: RE | Admit: 2023-05-15 | Discharge: 2023-05-15 | Disposition: A | Discharge status: Home / Self Care | Payer: No Typology Code available for payment source | Source: Ambulatory Visit | Attending: Internal Medicine | Admitting: Internal Medicine

## 2023-05-15 DIAGNOSIS — R52 Pain, unspecified: Secondary | ICD-10-CM | POA: Insufficient documentation

## 2023-05-15 DIAGNOSIS — M47816 Spondylosis without myelopathy or radiculopathy, lumbar region: Secondary | ICD-10-CM | POA: Diagnosis not present

## 2023-05-15 DIAGNOSIS — M5126 Other intervertebral disc displacement, lumbar region: Secondary | ICD-10-CM | POA: Diagnosis not present

## 2023-05-28 DIAGNOSIS — M542 Cervicalgia: Secondary | ICD-10-CM | POA: Diagnosis not present

## 2023-05-28 DIAGNOSIS — M9902 Segmental and somatic dysfunction of thoracic region: Secondary | ICD-10-CM | POA: Diagnosis not present

## 2023-05-28 DIAGNOSIS — M9901 Segmental and somatic dysfunction of cervical region: Secondary | ICD-10-CM | POA: Diagnosis not present

## 2023-05-28 DIAGNOSIS — M9903 Segmental and somatic dysfunction of lumbar region: Secondary | ICD-10-CM | POA: Diagnosis not present

## 2023-05-28 DIAGNOSIS — M62830 Muscle spasm of back: Secondary | ICD-10-CM | POA: Diagnosis not present

## 2023-05-28 DIAGNOSIS — M546 Pain in thoracic spine: Secondary | ICD-10-CM | POA: Diagnosis not present

## 2023-05-31 DIAGNOSIS — M9902 Segmental and somatic dysfunction of thoracic region: Secondary | ICD-10-CM | POA: Diagnosis not present

## 2023-05-31 DIAGNOSIS — M546 Pain in thoracic spine: Secondary | ICD-10-CM | POA: Diagnosis not present

## 2023-05-31 DIAGNOSIS — M9901 Segmental and somatic dysfunction of cervical region: Secondary | ICD-10-CM | POA: Diagnosis not present

## 2023-05-31 DIAGNOSIS — M9903 Segmental and somatic dysfunction of lumbar region: Secondary | ICD-10-CM | POA: Diagnosis not present

## 2023-05-31 DIAGNOSIS — M542 Cervicalgia: Secondary | ICD-10-CM | POA: Diagnosis not present

## 2023-05-31 DIAGNOSIS — M62830 Muscle spasm of back: Secondary | ICD-10-CM | POA: Diagnosis not present

## 2023-06-04 DIAGNOSIS — M546 Pain in thoracic spine: Secondary | ICD-10-CM | POA: Diagnosis not present

## 2023-06-04 DIAGNOSIS — M542 Cervicalgia: Secondary | ICD-10-CM | POA: Diagnosis not present

## 2023-06-04 DIAGNOSIS — M9901 Segmental and somatic dysfunction of cervical region: Secondary | ICD-10-CM | POA: Diagnosis not present

## 2023-06-04 DIAGNOSIS — M9902 Segmental and somatic dysfunction of thoracic region: Secondary | ICD-10-CM | POA: Diagnosis not present

## 2023-06-04 DIAGNOSIS — M62830 Muscle spasm of back: Secondary | ICD-10-CM | POA: Diagnosis not present

## 2023-06-04 DIAGNOSIS — M9903 Segmental and somatic dysfunction of lumbar region: Secondary | ICD-10-CM | POA: Diagnosis not present

## 2023-06-07 DIAGNOSIS — M810 Age-related osteoporosis without current pathological fracture: Secondary | ICD-10-CM | POA: Diagnosis not present

## 2023-06-07 DIAGNOSIS — I10 Essential (primary) hypertension: Secondary | ICD-10-CM | POA: Diagnosis not present

## 2023-06-08 DIAGNOSIS — M9903 Segmental and somatic dysfunction of lumbar region: Secondary | ICD-10-CM | POA: Diagnosis not present

## 2023-06-08 DIAGNOSIS — M9901 Segmental and somatic dysfunction of cervical region: Secondary | ICD-10-CM | POA: Diagnosis not present

## 2023-06-08 DIAGNOSIS — M62830 Muscle spasm of back: Secondary | ICD-10-CM | POA: Diagnosis not present

## 2023-06-08 DIAGNOSIS — M9902 Segmental and somatic dysfunction of thoracic region: Secondary | ICD-10-CM | POA: Diagnosis not present

## 2023-06-08 DIAGNOSIS — M542 Cervicalgia: Secondary | ICD-10-CM | POA: Diagnosis not present

## 2023-06-08 DIAGNOSIS — M546 Pain in thoracic spine: Secondary | ICD-10-CM | POA: Diagnosis not present

## 2023-06-13 DIAGNOSIS — M9901 Segmental and somatic dysfunction of cervical region: Secondary | ICD-10-CM | POA: Diagnosis not present

## 2023-06-13 DIAGNOSIS — M542 Cervicalgia: Secondary | ICD-10-CM | POA: Diagnosis not present

## 2023-06-13 DIAGNOSIS — M546 Pain in thoracic spine: Secondary | ICD-10-CM | POA: Diagnosis not present

## 2023-06-13 DIAGNOSIS — M9903 Segmental and somatic dysfunction of lumbar region: Secondary | ICD-10-CM | POA: Diagnosis not present

## 2023-06-13 DIAGNOSIS — M62830 Muscle spasm of back: Secondary | ICD-10-CM | POA: Diagnosis not present

## 2023-06-13 DIAGNOSIS — M9902 Segmental and somatic dysfunction of thoracic region: Secondary | ICD-10-CM | POA: Diagnosis not present

## 2023-06-18 DIAGNOSIS — F411 Generalized anxiety disorder: Secondary | ICD-10-CM | POA: Diagnosis not present

## 2023-06-18 DIAGNOSIS — I10 Essential (primary) hypertension: Secondary | ICD-10-CM | POA: Diagnosis not present

## 2023-06-18 DIAGNOSIS — G720 Drug-induced myopathy: Secondary | ICD-10-CM | POA: Diagnosis not present

## 2023-06-18 DIAGNOSIS — M722 Plantar fascial fibromatosis: Secondary | ICD-10-CM | POA: Diagnosis not present

## 2023-06-18 DIAGNOSIS — R110 Nausea: Secondary | ICD-10-CM | POA: Diagnosis not present

## 2023-06-18 DIAGNOSIS — Z713 Dietary counseling and surveillance: Secondary | ICD-10-CM | POA: Diagnosis not present

## 2023-06-18 DIAGNOSIS — E785 Hyperlipidemia, unspecified: Secondary | ICD-10-CM | POA: Diagnosis not present

## 2023-06-18 DIAGNOSIS — M810 Age-related osteoporosis without current pathological fracture: Secondary | ICD-10-CM | POA: Diagnosis not present

## 2023-06-18 DIAGNOSIS — E669 Obesity, unspecified: Secondary | ICD-10-CM | POA: Diagnosis not present

## 2023-06-18 DIAGNOSIS — M6530 Trigger finger, unspecified finger: Secondary | ICD-10-CM | POA: Diagnosis not present

## 2023-06-18 DIAGNOSIS — K219 Gastro-esophageal reflux disease without esophagitis: Secondary | ICD-10-CM | POA: Diagnosis not present

## 2023-06-18 DIAGNOSIS — K581 Irritable bowel syndrome with constipation: Secondary | ICD-10-CM | POA: Diagnosis not present

## 2023-06-20 DIAGNOSIS — M542 Cervicalgia: Secondary | ICD-10-CM | POA: Diagnosis not present

## 2023-06-20 DIAGNOSIS — M9902 Segmental and somatic dysfunction of thoracic region: Secondary | ICD-10-CM | POA: Diagnosis not present

## 2023-06-20 DIAGNOSIS — M62830 Muscle spasm of back: Secondary | ICD-10-CM | POA: Diagnosis not present

## 2023-06-20 DIAGNOSIS — M546 Pain in thoracic spine: Secondary | ICD-10-CM | POA: Diagnosis not present

## 2023-06-20 DIAGNOSIS — M9903 Segmental and somatic dysfunction of lumbar region: Secondary | ICD-10-CM | POA: Diagnosis not present

## 2023-06-20 DIAGNOSIS — M9901 Segmental and somatic dysfunction of cervical region: Secondary | ICD-10-CM | POA: Diagnosis not present

## 2023-06-25 DIAGNOSIS — M9902 Segmental and somatic dysfunction of thoracic region: Secondary | ICD-10-CM | POA: Diagnosis not present

## 2023-06-25 DIAGNOSIS — M9901 Segmental and somatic dysfunction of cervical region: Secondary | ICD-10-CM | POA: Diagnosis not present

## 2023-06-25 DIAGNOSIS — M62830 Muscle spasm of back: Secondary | ICD-10-CM | POA: Diagnosis not present

## 2023-06-25 DIAGNOSIS — M542 Cervicalgia: Secondary | ICD-10-CM | POA: Diagnosis not present

## 2023-06-25 DIAGNOSIS — M546 Pain in thoracic spine: Secondary | ICD-10-CM | POA: Diagnosis not present

## 2023-06-25 DIAGNOSIS — M9903 Segmental and somatic dysfunction of lumbar region: Secondary | ICD-10-CM | POA: Diagnosis not present

## 2023-09-06 ENCOUNTER — Other Ambulatory Visit (HOSPITAL_COMMUNITY)
Admission: RE | Admit: 2023-09-06 | Discharge: 2023-09-06 | Disposition: A | Discharge status: Home / Self Care | Payer: No Typology Code available for payment source | Source: Ambulatory Visit | Attending: Adult Health | Admitting: Adult Health

## 2023-09-06 ENCOUNTER — Ambulatory Visit: Payer: No Typology Code available for payment source | Admitting: Adult Health

## 2023-09-06 ENCOUNTER — Encounter: Payer: Self-pay | Admitting: Adult Health

## 2023-09-06 VITALS — BP 180/105 | HR 71 | Ht 65.0 in | Wt 183.0 lb

## 2023-09-06 DIAGNOSIS — N816 Rectocele: Secondary | ICD-10-CM | POA: Diagnosis not present

## 2023-09-06 DIAGNOSIS — Z1331 Encounter for screening for depression: Secondary | ICD-10-CM

## 2023-09-06 DIAGNOSIS — I10 Essential (primary) hypertension: Secondary | ICD-10-CM | POA: Diagnosis not present

## 2023-09-06 DIAGNOSIS — Z1151 Encounter for screening for human papillomavirus (HPV): Secondary | ICD-10-CM | POA: Insufficient documentation

## 2023-09-06 DIAGNOSIS — Z0000 Encounter for general adult medical examination without abnormal findings: Secondary | ICD-10-CM | POA: Diagnosis not present

## 2023-09-06 DIAGNOSIS — Z1211 Encounter for screening for malignant neoplasm of colon: Secondary | ICD-10-CM

## 2023-09-06 DIAGNOSIS — N8111 Cystocele, midline: Secondary | ICD-10-CM | POA: Diagnosis not present

## 2023-09-06 DIAGNOSIS — Z01419 Encounter for gynecological examination (general) (routine) without abnormal findings: Secondary | ICD-10-CM

## 2023-09-06 LAB — HEMOCCULT GUIAC POC 1CARD (OFFICE): Fecal Occult Blood, POC: NEGATIVE

## 2023-09-06 NOTE — Progress Notes (Signed)
 Patient ID: Victoria Page, female   DOB: 23-Jun-1956, 68 y.o.   MRN: [id number] History of Present Illness: Victoria Page is a 68 year old white female, divorced, PM, in for a well woman gyn exam and pap. She is working PT.  PCP is Dr Shona.    Current Medications, Allergies, Past Medical History, Past Surgical History, Family History and Social History were reviewed in [employer] record.     Review of Systems: Patient denies any headaches, hearing loss, fatigue, blurred vision, shortness of breath, chest pain, abdominal pain, problems with bowel movements(has IBS on linzess ), urination, or intercourse. No joint pain or mood swings.  Denies any vaginal bleeding   Physical Exam:BP (!) 180/105 (BP Location: Right Arm, Patient Position: Sitting, Cuff Size: Normal)   Pulse 71   Ht 5' 5 (1.651 m)   Wt 183 lb (83 kg)   BMI 30.45 kg/m   General:  Well developed, well nourished, no acute distress Skin:  Warm and dry Neck:  Midline trachea, normal thyroid , good ROM, no lymphadenopathy,no carotid bruits heard Lungs; Clear to auscultation bilaterally Breast:  No dominant palpable mass, retraction, or nipple discharge Cardiovascular: Regular rate and rhythm Abdomen:  Soft, non tender, no hepatosplenomegaly Pelvic:  External genitalia is normal in appearance, no lesions.  The vagina is pale, +cystocele. Urethra has no lesions or masses. The cervix is smooth, pap with HR HPV genotyping performed.  Uterus is felt to be normal size, shape, and contour.  No adnexal masses or tenderness noted.Bladder is non tender, no masses felt. Rectal: Good sphincter tone, no polyps, or hemorrhoids felt.  Hemoccult negative.+rectocele Extremities/musculoskeletal:  No swelling or varicosities noted, no clubbing or cyanosis Psych:  No mood changes, alert and cooperative,seems happy AA is 1 Fall risk is low    09/06/2023   10:02 AM 05/17/2020    9:06 AM 02/18/2020   10:28 AM  Depression screen  PHQ 2/9  Decreased Interest 1 0 2  Down, Depressed, Hopeless 1 0 1  PHQ - 2 Score 2 0 3  Altered sleeping 2 1 1   Tired, decreased energy 2 1 2   Change in appetite 1 1 1   Feeling bad or failure about yourself  1 0 0  Trouble concentrating 1 0 0  Moving slowly or fidgety/restless 0 0 1  Suicidal thoughts 0 0 0  PHQ-9 Score 9 3 8   Difficult doing work/chores  Not difficult at all Somewhat difficult       09/06/2023   10:02 AM 05/17/2020    9:08 AM  GAD 7 : Generalized Anxiety Score  Nervous, Anxious, on Edge 1 0  Control/stop worrying 1 0  Worry too much - different things 1 0  Trouble relaxing 1 0  Restless 1 0  Easily annoyed or irritable 1 1  Afraid - awful might happen 1 1  Total GAD 7 Score 7 2  Anxiety Difficulty  Not difficult at all    Upstream - 09/06/23 1008       Pregnancy Intention Screening   Does the patient want to become pregnant in the next year? N/A    Does the patient's partner want to become pregnant in the next year? N/A    Would the patient like to discuss contraceptive options today? N/A      Contraception Wrap Up   Current Method No Method - Other Reason   PM   Reason for No Current Contraceptive Method at Intake ([facility] Only) Other  End Method No Method - Other Reason   PM   Contraception Counseling Provided No             Examination chaperoned by Clarita Salt LPN  Impression and Plan: 1. Encounter for gynecological examination with Papanicolaou smear of cervix (Primary) Pap sent Pap in 3 years if normal Physical with PCP Labs with PCP Mammogram was negative 09/12/22 Colonoscopy per GI Stay active  - Cytology - PAP( [facility])  2. Routine general medical examination at a health care facility Pap sent  - Cytology - PAP( [facility])  3. Pelvic relaxation due to cystocele, midline  4. Rectocele  5. Encounter for screening fecal occult blood testing Hemoccult was negative  6. Essential hypertension Take BP meds daily, has not  taken yet today  Follow up with PCP

## 2023-09-11 LAB — CYTOLOGY - PAP
Comment: NEGATIVE
Comment: NEGATIVE
Comment: NEGATIVE
Diagnosis: NEGATIVE
HPV 16: NEGATIVE
HPV 18 / 45: NEGATIVE
High risk HPV: POSITIVE — AB

## 2023-09-13 ENCOUNTER — Encounter: Payer: Self-pay | Admitting: Adult Health

## 2023-09-13 DIAGNOSIS — R87810 Cervical high risk human papillomavirus (HPV) DNA test positive: Secondary | ICD-10-CM | POA: Insufficient documentation

## 2023-09-17 ENCOUNTER — Telehealth: Payer: Self-pay | Admitting: Adult Health

## 2023-09-17 NOTE — Telephone Encounter (Signed)
 Patient wanting clarification on pap results. Please advise.

## 2023-09-17 NOTE — Telephone Encounter (Signed)
 Pt aware of pap results and that she needs repeat in 1 year. Pt aware that HPV is a virus and could result negative in 1 year when checked. Pt advised she don't need to do anything different or change anything. Pt voiced understanding. JSY

## 2023-09-27 ENCOUNTER — Ambulatory Visit: Payer: No Typology Code available for payment source | Admitting: Adult Health

## 2023-10-11 ENCOUNTER — Other Ambulatory Visit (HOSPITAL_COMMUNITY): Payer: Self-pay

## 2023-10-11 ENCOUNTER — Telehealth: Payer: Self-pay | Admitting: Pharmacy Technician

## 2023-10-11 NOTE — Telephone Encounter (Signed)
 Pharmacy Patient Advocate Encounter   Received notification from CoverMyMeds that prior authorization for TRULANCE  3MG  is required/requested.   Insurance verification completed.   The patient is insured through [facility] .   Per test claim: PA required; PA submitted to above mentioned insurance via CoverMyMeds Key/confirmation #/EOC [id number] Status is pending

## 2023-10-11 NOTE — Telephone Encounter (Signed)
 Pharmacy Patient Advocate Encounter  Received notification from [facility] that Prior Authorization for TRULANCE  3MG  has been APPROVED from 2.6.25 to 2.6.26. Ran test claim, Copay is $0. This test claim was processed through [facility]- copay amounts may vary at other pharmacies due to pharmacy/plan contracts, or as the patient moves through the different stages of their insurance plan.   PA #/Case ID/Reference #:  [id number]

## 2023-10-15 ENCOUNTER — Other Ambulatory Visit: Payer: Self-pay | Admitting: Adult Health

## 2023-10-15 DIAGNOSIS — Z0000 Encounter for general adult medical examination without abnormal findings: Secondary | ICD-10-CM

## 2023-10-18 ENCOUNTER — Ambulatory Visit
Admission: RE | Admit: 2023-10-18 | Discharge: 2023-10-18 | Discharge status: Home / Self Care | Payer: No Typology Code available for payment source | Source: Ambulatory Visit | Attending: Adult Health | Admitting: Adult Health

## 2023-10-18 ENCOUNTER — Ambulatory Visit: Payer: No Typology Code available for payment source

## 2023-10-18 DIAGNOSIS — Z0000 Encounter for general adult medical examination without abnormal findings: Secondary | ICD-10-CM

## 2023-10-18 DIAGNOSIS — Z1231 Encounter for screening mammogram for malignant neoplasm of breast: Secondary | ICD-10-CM | POA: Diagnosis not present

## 2023-10-25 ENCOUNTER — Ambulatory Visit: Payer: No Typology Code available for payment source

## 2023-12-20 DIAGNOSIS — I10 Essential (primary) hypertension: Secondary | ICD-10-CM | POA: Diagnosis not present

## 2023-12-20 DIAGNOSIS — M810 Age-related osteoporosis without current pathological fracture: Secondary | ICD-10-CM | POA: Diagnosis not present

## 2023-12-27 DIAGNOSIS — K5900 Constipation, unspecified: Secondary | ICD-10-CM | POA: Diagnosis not present

## 2023-12-27 DIAGNOSIS — M810 Age-related osteoporosis without current pathological fracture: Secondary | ICD-10-CM | POA: Diagnosis not present

## 2023-12-27 DIAGNOSIS — K219 Gastro-esophageal reflux disease without esophagitis: Secondary | ICD-10-CM | POA: Diagnosis not present

## 2023-12-27 DIAGNOSIS — E785 Hyperlipidemia, unspecified: Secondary | ICD-10-CM | POA: Diagnosis not present

## 2023-12-27 DIAGNOSIS — K581 Irritable bowel syndrome with constipation: Secondary | ICD-10-CM | POA: Diagnosis not present

## 2023-12-27 DIAGNOSIS — M6530 Trigger finger, unspecified finger: Secondary | ICD-10-CM | POA: Diagnosis not present

## 2023-12-27 DIAGNOSIS — I10 Essential (primary) hypertension: Secondary | ICD-10-CM | POA: Diagnosis not present

## 2023-12-27 DIAGNOSIS — M25511 Pain in right shoulder: Secondary | ICD-10-CM | POA: Diagnosis not present

## 2023-12-27 DIAGNOSIS — M722 Plantar fascial fibromatosis: Secondary | ICD-10-CM | POA: Diagnosis not present

## 2023-12-27 DIAGNOSIS — M545 Low back pain, unspecified: Secondary | ICD-10-CM | POA: Diagnosis not present

## 2023-12-27 DIAGNOSIS — F411 Generalized anxiety disorder: Secondary | ICD-10-CM | POA: Diagnosis not present

## 2023-12-27 DIAGNOSIS — G720 Drug-induced myopathy: Secondary | ICD-10-CM | POA: Diagnosis not present

## 2024-01-17 DIAGNOSIS — Z713 Dietary counseling and surveillance: Secondary | ICD-10-CM | POA: Diagnosis not present

## 2024-01-17 DIAGNOSIS — Z6830 Body mass index (BMI) 30.0-30.9, adult: Secondary | ICD-10-CM | POA: Diagnosis not present

## 2024-01-17 DIAGNOSIS — E66811 Obesity, class 1: Secondary | ICD-10-CM | POA: Diagnosis not present

## 2024-01-17 DIAGNOSIS — Z7182 Exercise counseling: Secondary | ICD-10-CM | POA: Diagnosis not present

## 2024-01-17 DIAGNOSIS — Z79899 Other long term (current) drug therapy: Secondary | ICD-10-CM | POA: Diagnosis not present

## 2024-01-17 DIAGNOSIS — E669 Obesity, unspecified: Secondary | ICD-10-CM | POA: Diagnosis not present

## 2024-01-17 DIAGNOSIS — I10 Essential (primary) hypertension: Secondary | ICD-10-CM | POA: Diagnosis not present

## 2024-04-20 ENCOUNTER — Ambulatory Visit
Admission: EM | Admit: 2024-04-20 | Discharge: 2024-04-20 | Disposition: A | Discharge status: Home / Self Care | Attending: Physician Assistant | Admitting: Physician Assistant

## 2024-04-20 ENCOUNTER — Encounter: Payer: Self-pay | Admitting: Emergency Medicine

## 2024-04-20 DIAGNOSIS — J0140 Acute pansinusitis, unspecified: Secondary | ICD-10-CM | POA: Insufficient documentation

## 2024-04-20 DIAGNOSIS — R350 Frequency of micturition: Secondary | ICD-10-CM | POA: Diagnosis not present

## 2024-04-20 LAB — POCT URINE DIPSTICK
Bilirubin, UA: NEGATIVE
Glucose, UA: NEGATIVE mg/dL
Ketones, POC UA: NEGATIVE mg/dL
Leukocytes, UA: NEGATIVE
Nitrite, UA: NEGATIVE
Protein Ur, POC: NEGATIVE mg/dL
Spec Grav, UA: 1.015 (ref 1.010–1.025)
Urobilinogen, UA: 0.2 U/dL
pH, UA: 7 (ref 5.0–8.0)

## 2024-04-20 MED ORDER — AMOXICILLIN-POT CLAVULANATE 875-125 MG PO TABS: 1.0000 | ORAL_TABLET | Freq: Two times a day (BID) | ORAL | 0 refills | Status: AC

## 2024-04-20 NOTE — ED Provider Notes (Signed)
 [facility]    CSN: [id number] Arrival date & time: 04/20/24  1145      History   Chief Complaint No chief complaint on file.   HPI Victoria Page is a 68 y.o. female.   Patient presents today with a week and a half long history of URI symptoms including sore throat, nasal congestion, postnasal drainage, sinus pressure/pain.  She denies any significant cough.  Denies any fever, chest pain, shortness of breath, nausea, vomiting, diarrhea.  She has been taking over-the-counter allergy medicine without improvement of symptoms.  She does have a history of recurrent sinus infections with similar presentation typically requiring antibiotics.  Denies any additional antibiotics in the past 90 days.  She denies any known sick contacts.  She is having difficulty with her daily duties as a result of symptoms.  In addition, she reports a several day history of urinary frequency.  She does have a history of recurrent urinary tract infections with similar presentation.  She denies any significant dysuria but does have discomfort with micturition more as a bladder cramping/spasm.  She denies any recent angina procedure, catheterization, history of nephrolithiasis.  She does not take an SGLT2 inhibitor.  Denies any recent antibiotics.  She does report some lower back pain and general achiness but is unsure if this is related to development of UTI or other illness.    Past Medical History:  Diagnosis Date   Anxiety    Back pain    Biliary colic    Constipation    Gallstones    GERD (gastroesophageal reflux disease)    High cholesterol    High triglycerides    History of sinus problem    Hypertension    IBS (irritable bowel syndrome)    Joint pain    LLQ pain 08/19/2014   Plantar fasciitis    SOB (shortness of breath)     Patient Active Problem List   Diagnosis Date Noted   Cervical high risk human papillomavirus (HPV) DNA test positive 09/13/2023   Routine general medical  examination at a health care facility 09/06/2023   Rectocele 08/16/2022   Pelvic pressure in female 08/16/2022   Flushing 02/24/2022   Pain in throat 02/24/2022   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 02/10/2022   Bone spur of left foot 02/10/2022   Streptococcal sore throat 02/10/2022   SOB (shortness of breath) on exertion 12/08/2021   Acute non-recurrent pansinusitis 01/17/2021   Recurrent sinusitis 01/17/2021   Overweight with body mass index (BMI) of 27 to 27.9 in adult 11/04/2020   Other irritable bowel syndrome 10/24/2020   Cyst of ovary, right 09/28/2020   Urinary frequency 09/10/2020   Pelvic pain 09/10/2020   Pelvic relaxation due to cystocele, midline 09/10/2020   Class 1 obesity due to excess calories in adult 07/20/2020   Fatigue 07/05/2020   At risk for osteoporosis 07/05/2020   Mixed hyperlipidemia 06/16/2020   Insulin resistance 06/16/2020   Vitamin D  deficiency 06/16/2020   At risk for heart disease 06/16/2020   Encounter for screening fecal occult blood testing 05/17/2020   History of UTI 05/17/2020   Encounter for gynecological examination with Papanicolaou smear of cervix 05/17/2020   Osteopenia 08/25/2019   Gastroesophageal reflux disease 07/28/2019   Essential hypertension 07/28/2019   Anxiety 07/28/2019   Hypertriglyceridemia 07/28/2019   Sinus pressure 07/28/2019   History of ovarian cyst 08/13/2018   Screening for colorectal cancer 08/13/2018   Routine adult health maintenance 08/13/2018   Uterine  tenderness 11/16/2017   Vaginal atrophy 11/16/2017   Vulvar irritation 11/16/2017   UTI (urinary tract infection) 11/13/2017   Encounter for screening colonoscopy 09/05/2016   LLQ pain 08/19/2014   Brachial neuritis or radiculitis 06/12/2007   SHOULDER PAIN 05/14/2007   NECK PAIN, ACUTE 05/14/2007   History of cardiovascular disorder 03/05/2007    Past Surgical History:  Procedure Laterality Date   COLONOSCOPY N/A 09/06/2016    Procedure: COLONOSCOPY;  Surgeon: Claudis RAYMOND Rivet, MD;  Location: [facility];  Service: Endoscopy;  Laterality: N/A;  8:30   FLEXIBLE SIGMOIDOSCOPY      OB History     Gravida  5   Para  3   Term      Preterm      AB  1   Living  3      SAB  1   IAB      Ectopic      Multiple      Live Births               Home Medications    Prior to Admission medications   Medication Sig Start Date End Date Taking? Authorizing Provider  amoxicillin -clavulanate (AUGMENTIN ) 875-125 MG tablet Take 1 tablet by mouth every 12 (twelve) hours. 04/20/24  Yes Liberato Stansbery K, PA-C  ALPRAZolam (XANAX) 0.5 MG tablet Take 0.5 mg by mouth daily as needed. 03/31/20   [provider]  Bempedoic Acid (NEXLETOL PO) Take by mouth daily. Unknown dosage amount.    [provider]  chlorhexidine (PERIDEX) 0.12 % solution  10/10/21   [provider]  fluticasone  (FLONASE ) 50 MCG/ACT nasal spray Place 1 spray into both nostrils 2 (two) times daily. 12/10/21   Stuart Vernell Norris, PA-C  hydrochlorothiazide (HYDRODIURIL) 25 MG tablet Take 25 mg by mouth daily.    [provider]  linaclotide  (LINZESS ) 145 MCG CAPS capsule Take 1 capsule (145 mcg total) by mouth daily before breakfast. Please schedule a follow up appointment for further refills. Thank you. 04/26/23   Armbruster, Elspeth SQUIBB, MD  Meloxicam  15 MG TBDP Take 15 mg by mouth daily. 01/17/23   Chandra Harlene LABOR, NP  methocarbamol  (ROBAXIN ) 500 MG tablet Take 1 tablet (500 mg total) by mouth every 8 (eight) hours as needed for muscle spasms. Do not drink alcohol or drive while taking this medication.  May cause drowsiness. 05/07/23   Stuart Vernell Norris, PA-C  ondansetron  (ZOFRAN -ODT) 4 MG disintegrating tablet Take 1 tablet (4 mg total) by mouth every 8 (eight) hours as needed for nausea or vomiting. 10/07/21   Stuart Vernell Norris, PA-C  pantoprazole  (PROTONIX ) 40 MG tablet Take 1 tablet (40 mg total) by mouth  daily. 12/10/21   Stuart Vernell Norris, PA-C  Vitamin D , Ergocalciferol , (DRISDOL ) 1.25 MG (50000 UNIT) CAPS capsule Take 1 capsule (50,000 Units total) by mouth every 7 (seven) days. Patient not taking: Reported on 09/06/2023 10/11/22   Jonel Rockie BIRCH, NP    Family History Family History  Problem Relation Age of Onset   Lung cancer Mother    Anxiety disorder Mother    Alcoholism Mother    Heart attack Father    Sudden death Father    Diverticulitis Paternal Grandmother    Diverticulitis Brother        died from septic shock   COPD Brother    Breast cancer Daughter 11   Other Brother        1/2 brother-   Colon cancer Neg  Hx     Social History Social History   Tobacco Use   Smoking status: Never    Passive exposure: Never   Smokeless tobacco: Never  Vaping Use   Vaping status: Never Used  Substance Use Topics   Alcohol use: Yes    Comment: few drinks a year   Drug use: No     Allergies   Prednisone , Shrimp extract, Shrimp [shellfish allergy], and Sulfa  antibiotics   Review of Systems Review of Systems  Constitutional:  Positive for activity change. Negative for appetite change, fatigue and fever.  HENT:  Positive for congestion, postnasal drip, sinus pressure, sinus pain and sore throat. Negative for ear pain and sneezing.   Respiratory:  Negative for cough and shortness of breath.   Cardiovascular:  Negative for chest pain.  Gastrointestinal:  Positive for abdominal pain (Suprapubic). Negative for diarrhea, nausea and vomiting.  Genitourinary:  Positive for frequency and urgency. Negative for dysuria, flank pain, vaginal bleeding, vaginal discharge and vaginal pain.  Musculoskeletal:  Positive for back pain. Negative for arthralgias and myalgias.     Physical Exam Triage Vital Signs ED Triage Vitals  Encounter Vitals Group     BP 04/20/24 1158 (!) 156/89     Girls Systolic BP Percentile --      Girls Diastolic BP Percentile --      Boys Systolic BP  Percentile --      Boys Diastolic BP Percentile --      Pulse Rate 04/20/24 1158 68     Resp 04/20/24 1158 18     Temp 04/20/24 1158 (!) 97.5 F (36.4 C)     Temp Source 04/20/24 1158 Oral     SpO2 04/20/24 1158 98 %     Weight --      Height --      Head Circumference --      Peak Flow --      Pain Score 04/20/24 1159 4     Pain Loc --      Pain Education --      Exclude from Growth Chart --    No data found.  Updated Vital Signs BP (!) 156/89 (BP Location: Left Arm)   Pulse 68   Temp (!) 97.5 F (36.4 C) (Oral)   Resp 18   SpO2 98%   Visual Acuity Right Eye Distance:   Left Eye Distance:   Bilateral Distance:    Right Eye Near:   Left Eye Near:    Bilateral Near:     Physical Exam Vitals reviewed.  Constitutional:      General: She is awake. She is not in acute distress.    Appearance: Normal appearance. She is well-developed. She is not ill-appearing.     Comments: Very pleasant female appears in today to no acute distress sitting comfortably in exam room  HENT:     Head: Normocephalic and atraumatic.     Right Ear: Tympanic membrane, ear canal and external ear normal. Tympanic membrane is not erythematous or bulging.     Left Ear: Tympanic membrane, ear canal and external ear normal. Tympanic membrane is not erythematous or bulging.     Nose:     Right Sinus: Maxillary sinus tenderness and frontal sinus tenderness present.     Left Sinus: Maxillary sinus tenderness and frontal sinus tenderness present.     Mouth/Throat:     Pharynx: Uvula midline. Posterior oropharyngeal erythema and postnasal drip present. No oropharyngeal exudate.  Cardiovascular:  Rate and Rhythm: Normal rate and regular rhythm.     Heart sounds: Normal heart sounds, S1 normal and S2 normal. No murmur heard. Pulmonary:     Effort: Pulmonary effort is normal.     Breath sounds: Normal breath sounds. No wheezing, rhonchi or rales.     Comments: Clear to auscultation  bilaterally Abdominal:     General: Bowel sounds are normal.     Palpations: Abdomen is soft.     Tenderness: There is no abdominal tenderness. There is no right CVA tenderness, left CVA tenderness, guarding or rebound.     Comments: Benign abdominal exam.  No CVA tenderness.  Lymphadenopathy:     Head:     Right side of head: No submental, submandibular or tonsillar adenopathy.     Left side of head: No submental, submandibular or tonsillar adenopathy.     Cervical: No cervical adenopathy.  Psychiatric:        Behavior: Behavior is cooperative.      UC Treatments / Results  Labs (all labs ordered are listed, but only abnormal results are displayed) Labs Reviewed  POCT URINE DIPSTICK - Abnormal; Notable for the following components:      Result Value   Blood, UA trace-intact (*)    All other components within normal limits  URINE CULTURE    EKG   Radiology No results found.  Procedures Procedures (including critical care time)  Medications Ordered in UC Medications - No data to display  Initial Impression / Assessment and Plan / UC Course  I have reviewed the triage vital signs and the nursing notes.  Pertinent labs & imaging results that were available during my care of the patient were reviewed by me and considered in my medical decision making (see chart for details).     Patient is well-appearing, afebrile, nontoxic, nontachycardic.  No indication for viral testing as she has been symptomatic for over a week and this would not change management.  Given her prolonged and worsening symptoms we will cover for secondary infection with Augmentin  twice daily for 7 days.  Notification for dose adjustment based on metabolic panel from 10/18/2022 with creatinine of 0.61 and calculated creatinine clearance of 117.27 mL/min.  Recommend that she use over-the-counter medication including Mucinex, Flonase , nasal saline/sinus rinses for additional symptom relief.  Chest x-ray was  deferred as she had no adventitious lung sounds on exam and her oxygen saturation was 98%.  We discussed that if she is not feeling better in a week or if she has any worsening symptoms she needs to be seen immediately.  UA was obtained given her reported urinary symptoms that showed trace blood but otherwise no significant abnormality.  Recommended that she push fluids in case this is contributing to her symptoms.  We discussed that Augmentin  should cover for UTI but we will send this for culture just to ensure that she is not developing a urinary tract infection with bacteria that is resistant to this antibiotic.  If we need to change the antibiotic we will contact her but since it is being prescribed primarily for sinus infection we will have her complete the course even if her culture is negative.  We discussed that she had a little bit of blood on her urine and so I recommend that she follow-up with her primary care to have UA repeated in a few weeks to ensure that this clears with clearing of infection.  If she has any worsening or changing symptoms she is  to be seen immediately.  She is return precautions given.  Final Clinical Impressions(s) / UC Diagnoses   Final diagnoses:  Acute non-recurrent pansinusitis  Urinary frequency     Discharge Instructions      We are treating you for sinus infection.  Start Augmentin  twice daily for 7 days.  I also recommend using allergy medication including Mucinex, Flonase , nasal saline/sinus rinses, over-the-counter cetirizine to help with your symptoms.  If you are not feeling better in a week or if anything worsens and you have high fever, severe cough, shortness of breath, chest pain, nausea/vomiting you need to be seen immediately.  Your urine had a little bit of blood but otherwise did not have any signs of infection.  I am going to send this for culture and I will contact you if we need to change your antibiotic.  The Augmentin  that we are using to  treat your sinus infection should also cover for a urinary tract infection.  I do recommend that you drink plenty of fluid.  Follow-up with your primary care in a few weeks to have your urine rechecked to make sure that the blood we still goes away.  If anything worsens and you have abdominal pain, fever, nausea, vomiting you need to be seen immediately.     ED Prescriptions     Medication Sig Dispense Auth. Provider   amoxicillin -clavulanate (AUGMENTIN ) 875-125 MG tablet Take 1 tablet by mouth every 12 (twelve) hours. 14 tablet Iridian Reader K, PA-C      PDMP not reviewed this encounter.   Sherrell Rocky POUR, PA-C 04/20/24 1229

## 2024-04-20 NOTE — ED Triage Notes (Signed)
 Nasal congestion, sore throat and lower abd pain, urinary frequency x 1 week.  Has taken azo and Allegra.

## 2024-04-20 NOTE — Discharge Instructions (Signed)
 We are treating you for sinus infection.  Start Augmentin  twice daily for 7 days.  I also recommend using allergy medication including Mucinex, Flonase , nasal saline/sinus rinses, over-the-counter cetirizine to help with your symptoms.  If you are not feeling better in a week or if anything worsens and you have high fever, severe cough, shortness of breath, chest pain, nausea/vomiting you need to be seen immediately.  Your urine had a little bit of blood but otherwise did not have any signs of infection.  I am going to send this for culture and I will contact you if we need to change your antibiotic.  The Augmentin  that we are using to treat your sinus infection should also cover for a urinary tract infection.  I do recommend that you drink plenty of fluid.  Follow-up with your primary care in a few weeks to have your urine rechecked to make sure that the blood we still goes away.  If anything worsens and you have abdominal pain, fever, nausea, vomiting you need to be seen immediately.

## 2024-04-21 ENCOUNTER — Telehealth: Payer: Self-pay | Admitting: Adult Health

## 2024-04-21 NOTE — Telephone Encounter (Signed)
 Pt aware urine culture results are not back yet; can take a few days to get those results. Pt advised that if she needs any meds regarding urine culture, that Urgent Care should be in touch. Pt voiced understanding. JSY

## 2024-04-21 NOTE — Telephone Encounter (Signed)
 Patient went to urgent care and would like for you to look at results and see if she needs to come in our office for a visit. Please advise.

## 2024-04-22 ENCOUNTER — Ambulatory Visit (HOSPITAL_COMMUNITY): Payer: Self-pay

## 2024-04-22 LAB — URINE CULTURE: Culture: NO GROWTH

## 2024-06-23 DIAGNOSIS — I10 Essential (primary) hypertension: Secondary | ICD-10-CM | POA: Diagnosis not present

## 2024-06-23 DIAGNOSIS — M810 Age-related osteoporosis without current pathological fracture: Secondary | ICD-10-CM | POA: Diagnosis not present

## 2024-11-06 ENCOUNTER — Ambulatory Visit: Admitting: Adult Health
